# Patient Record
Sex: Male | Born: 1970 | Race: White | Hispanic: No | Marital: Married | Smoking: Never smoker
Health system: Southern US, Community
[De-identification: ages and names within clinical notes are randomized; demographics above are authoritative.]

## PROBLEM LIST (undated history)

## (undated) DIAGNOSIS — Z8719 Personal history of other diseases of the digestive system: Secondary | ICD-10-CM

## (undated) DIAGNOSIS — I341 Nonrheumatic mitral (valve) prolapse: Secondary | ICD-10-CM

## (undated) DIAGNOSIS — R55 Syncope and collapse: Secondary | ICD-10-CM

## (undated) DIAGNOSIS — K449 Diaphragmatic hernia without obstruction or gangrene: Secondary | ICD-10-CM

## (undated) DIAGNOSIS — R4702 Dysphasia: Secondary | ICD-10-CM

## (undated) DIAGNOSIS — K219 Gastro-esophageal reflux disease without esophagitis: Secondary | ICD-10-CM

## (undated) DIAGNOSIS — K629 Disease of anus and rectum, unspecified: Secondary | ICD-10-CM

## (undated) DIAGNOSIS — K625 Hemorrhage of anus and rectum: Secondary | ICD-10-CM

## (undated) DIAGNOSIS — R51 Headache: Secondary | ICD-10-CM

## (undated) DIAGNOSIS — S83289A Other tear of lateral meniscus, current injury, unspecified knee, initial encounter: Secondary | ICD-10-CM

## (undated) DIAGNOSIS — Z8711 Personal history of peptic ulcer disease: Secondary | ICD-10-CM

## (undated) DIAGNOSIS — M1990 Unspecified osteoarthritis, unspecified site: Secondary | ICD-10-CM

## (undated) DIAGNOSIS — K649 Unspecified hemorrhoids: Secondary | ICD-10-CM

## (undated) DIAGNOSIS — K226 Gastro-esophageal laceration-hemorrhage syndrome: Secondary | ICD-10-CM

## (undated) DIAGNOSIS — G43109 Migraine with aura, not intractable, without status migrainosus: Secondary | ICD-10-CM

## (undated) DIAGNOSIS — Z9289 Personal history of other medical treatment: Secondary | ICD-10-CM

## (undated) HISTORY — DX: Migraine with aura, not intractable, without status migrainosus: G43.109

## (undated) HISTORY — DX: Nonrheumatic mitral (valve) prolapse: I34.1

## (undated) HISTORY — DX: Headache: R51

## (undated) HISTORY — PX: MULTIPLE TOOTH EXTRACTIONS: SHX2053

## (undated) HISTORY — DX: Unspecified osteoarthritis, unspecified site: M19.90

## (undated) HISTORY — DX: Syncope and collapse: R55

## (undated) HISTORY — DX: Personal history of other medical treatment: Z92.89

## (undated) HISTORY — DX: Other tear of lateral meniscus, current injury, unspecified knee, initial encounter: S83.289A

## (undated) HISTORY — DX: Gastro-esophageal laceration-hemorrhage syndrome: K22.6

## (undated) HISTORY — PX: KNEE ARTHROSCOPY: SUR90

## (undated) HISTORY — PX: OTHER SURGICAL HISTORY: SHX169

## (undated) SURGERY — ESOPHAGOGASTRODUODENOSCOPY (EGD) WITH PROPOFOL
Anesthesia: Monitor Anesthesia Care

---
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
## 1999-02-11 ENCOUNTER — Emergency Department (HOSPITAL_COMMUNITY): Admission: EM | Admit: 1999-02-11 | Discharge: 1999-02-11 | Payer: Self-pay | Admitting: Emergency Medicine

## 1999-05-14 ENCOUNTER — Encounter: Payer: Self-pay | Admitting: Emergency Medicine

## 1999-05-14 ENCOUNTER — Emergency Department (HOSPITAL_COMMUNITY): Admission: EM | Admit: 1999-05-14 | Discharge: 1999-05-14 | Payer: Self-pay | Admitting: Emergency Medicine

## 1999-07-10 ENCOUNTER — Emergency Department (HOSPITAL_COMMUNITY): Admission: EM | Admit: 1999-07-10 | Discharge: 1999-07-10 | Payer: Self-pay | Admitting: Emergency Medicine

## 1999-07-10 ENCOUNTER — Encounter: Payer: Self-pay | Admitting: Emergency Medicine

## 1999-07-27 ENCOUNTER — Emergency Department (HOSPITAL_COMMUNITY): Admission: EM | Admit: 1999-07-27 | Discharge: 1999-07-27 | Payer: Self-pay | Admitting: *Deleted

## 1999-07-29 ENCOUNTER — Emergency Department (HOSPITAL_COMMUNITY): Admission: EM | Admit: 1999-07-29 | Discharge: 1999-07-29 | Payer: Self-pay | Admitting: Emergency Medicine

## 1999-07-29 ENCOUNTER — Encounter: Payer: Self-pay | Admitting: Emergency Medicine

## 1999-08-16 ENCOUNTER — Emergency Department (HOSPITAL_COMMUNITY): Admission: EM | Admit: 1999-08-16 | Discharge: 1999-08-16 | Payer: Self-pay

## 2000-04-03 ENCOUNTER — Emergency Department (HOSPITAL_COMMUNITY): Admission: EM | Admit: 2000-04-03 | Discharge: 2000-04-03 | Payer: Self-pay | Admitting: Emergency Medicine

## 2000-07-13 ENCOUNTER — Emergency Department (HOSPITAL_COMMUNITY): Admission: EM | Admit: 2000-07-13 | Discharge: 2000-07-13 | Payer: Self-pay | Admitting: Emergency Medicine

## 2000-07-13 ENCOUNTER — Encounter: Payer: Self-pay | Admitting: Emergency Medicine

## 2000-09-19 ENCOUNTER — Emergency Department (HOSPITAL_COMMUNITY): Admission: EM | Admit: 2000-09-19 | Discharge: 2000-09-19 | Payer: Self-pay | Admitting: Emergency Medicine

## 2000-09-27 ENCOUNTER — Encounter: Admission: RE | Admit: 2000-09-27 | Discharge: 2000-09-27 | Payer: Self-pay | Admitting: Urology

## 2000-09-27 ENCOUNTER — Encounter: Payer: Self-pay | Admitting: Urology

## 2001-09-04 ENCOUNTER — Emergency Department (HOSPITAL_COMMUNITY): Admission: EM | Admit: 2001-09-04 | Discharge: 2001-09-04 | Payer: Self-pay | Admitting: Emergency Medicine

## 2002-08-07 ENCOUNTER — Emergency Department (HOSPITAL_COMMUNITY): Admission: EM | Admit: 2002-08-07 | Discharge: 2002-08-07 | Payer: Self-pay | Admitting: Emergency Medicine

## 2004-10-22 ENCOUNTER — Emergency Department (HOSPITAL_COMMUNITY): Admission: EM | Admit: 2004-10-22 | Discharge: 2004-10-22 | Payer: Self-pay | Admitting: Emergency Medicine

## 2005-01-05 ENCOUNTER — Emergency Department (HOSPITAL_COMMUNITY): Admission: EM | Admit: 2005-01-05 | Discharge: 2005-01-05 | Payer: Self-pay | Admitting: Family Medicine

## 2005-05-02 ENCOUNTER — Emergency Department (HOSPITAL_COMMUNITY): Admission: EM | Admit: 2005-05-02 | Discharge: 2005-05-03 | Payer: Self-pay | Admitting: Emergency Medicine

## 2006-08-16 ENCOUNTER — Emergency Department (HOSPITAL_COMMUNITY): Admission: EM | Admit: 2006-08-16 | Discharge: 2006-08-16 | Payer: Self-pay | Admitting: Family Medicine

## 2006-08-25 ENCOUNTER — Emergency Department (HOSPITAL_COMMUNITY): Admission: EM | Admit: 2006-08-25 | Discharge: 2006-08-25 | Payer: Self-pay | Admitting: Family Medicine

## 2007-10-16 ENCOUNTER — Emergency Department (HOSPITAL_COMMUNITY): Admission: EM | Admit: 2007-10-16 | Discharge: 2007-10-16 | Payer: Self-pay | Admitting: Emergency Medicine

## 2008-03-08 ENCOUNTER — Emergency Department (HOSPITAL_COMMUNITY): Admission: EM | Admit: 2008-03-08 | Discharge: 2008-03-08 | Payer: Self-pay | Admitting: Emergency Medicine

## 2009-01-29 ENCOUNTER — Encounter: Payer: Self-pay | Admitting: Family Medicine

## 2009-02-09 ENCOUNTER — Encounter: Payer: Self-pay | Admitting: Family Medicine

## 2009-02-26 ENCOUNTER — Encounter: Payer: Self-pay | Admitting: Family Medicine

## 2009-02-26 HISTORY — PX: KNEE ARTHROSCOPY: SUR90

## 2009-03-04 ENCOUNTER — Encounter: Payer: Self-pay | Admitting: Family Medicine

## 2009-04-15 ENCOUNTER — Encounter: Payer: Self-pay | Admitting: Family Medicine

## 2010-01-22 ENCOUNTER — Encounter: Payer: Self-pay | Admitting: Family Medicine

## 2010-02-06 ENCOUNTER — Encounter: Payer: Self-pay | Admitting: Family Medicine

## 2010-02-09 ENCOUNTER — Encounter: Payer: Self-pay | Admitting: Family Medicine

## 2010-03-01 ENCOUNTER — Emergency Department (HOSPITAL_COMMUNITY)
Admission: EM | Admit: 2010-03-01 | Discharge: 2010-03-01 | Payer: Self-pay | Source: Home / Self Care | Admitting: Emergency Medicine

## 2010-03-10 ENCOUNTER — Telehealth: Payer: Self-pay | Admitting: Family Medicine

## 2010-03-10 ENCOUNTER — Ambulatory Visit: Payer: Self-pay | Admitting: Family Medicine

## 2010-03-10 DIAGNOSIS — R5383 Other fatigue: Secondary | ICD-10-CM

## 2010-03-10 DIAGNOSIS — R519 Headache, unspecified: Secondary | ICD-10-CM | POA: Insufficient documentation

## 2010-03-10 DIAGNOSIS — R5381 Other malaise: Secondary | ICD-10-CM | POA: Insufficient documentation

## 2010-03-10 DIAGNOSIS — R51 Headache: Secondary | ICD-10-CM | POA: Insufficient documentation

## 2010-03-10 DIAGNOSIS — R300 Dysuria: Secondary | ICD-10-CM | POA: Insufficient documentation

## 2010-03-10 LAB — CONVERTED CEMR LAB
Blood in Urine, dipstick: NEGATIVE
Protein, U semiquant: NEGATIVE
Urobilinogen, UA: 0.2
WBC Urine, dipstick: NEGATIVE

## 2010-03-11 ENCOUNTER — Encounter: Payer: Self-pay | Admitting: Family Medicine

## 2010-03-14 HISTORY — PX: SHOULDER SURGERY: SHX246

## 2010-03-14 HISTORY — PX: SHOULDER ARTHROSCOPY W/ ROTATOR CUFF REPAIR: SHX2400

## 2010-03-18 ENCOUNTER — Encounter: Payer: Self-pay | Admitting: Family Medicine

## 2010-03-22 ENCOUNTER — Encounter: Payer: Self-pay | Admitting: Family Medicine

## 2010-04-06 ENCOUNTER — Ambulatory Visit
Admission: RE | Admit: 2010-04-06 | Discharge: 2010-04-06 | Payer: Self-pay | Source: Home / Self Care | Attending: Family Medicine | Admitting: Family Medicine

## 2010-04-07 ENCOUNTER — Encounter
Admission: RE | Admit: 2010-04-07 | Discharge: 2010-04-07 | Payer: Self-pay | Source: Home / Self Care | Attending: Neurosurgery | Admitting: Neurosurgery

## 2010-04-09 ENCOUNTER — Ambulatory Visit
Admission: RE | Admit: 2010-04-09 | Discharge: 2010-04-09 | Payer: Self-pay | Source: Home / Self Care | Attending: Family Medicine | Admitting: Family Medicine

## 2010-04-15 ENCOUNTER — Encounter: Payer: Self-pay | Admitting: Family Medicine

## 2010-04-15 NOTE — Consult Note (Signed)
Summary: [facility]  [facility]   Imported By: Lanelle Bal 04/01/2010 08:47:11  _____________________________________________________________________  External Attachment:    Type:   Image     Comment:   External Document

## 2010-04-15 NOTE — Assessment & Plan Note (Signed)
Summary: tb skin test for school/alc  Nurse Visit   Allergies: 1)  ! Codeine 2)  ! Vicodin (Hydrocodone-Acetaminophen)  Immunizations Administered:  PPD Skin Test:    Vaccine Type: PPD    Site: left forearm    Mfr: Sanofi Pasteur    Dose: 0.1 ml    Route: ID    Given by: Selena Batten Dance CMA (AAMA)    Exp. Date: 11/13/2011    Lot #: [id number]  Orders Added: 1)  TB Skin Test [86580] 2)  Admin 1st Vaccine [90471]  Appended Document: tb skin test for school/alc    Clinical Lists Changes  Observations: Added new observation of TB PPDRESULT: negative (04/09/2010 8:45) Added new observation of PPD RESULT: < 5mm (04/09/2010 8:45) Added new observation of TB-PPD RDDTE: 04/09/2010 (04/09/2010 8:45)       PPD Results    Date of reading: 04/09/2010    Results: < 5mm    Interpretation: negative

## 2010-04-15 NOTE — Assessment & Plan Note (Signed)
Summary: Read ppd//kad  Nurse Visit  Comments Patient came in for PPD reading. Negative reading. Added to chart. Kim Dance CMA Oveta Idris Dull)  April 09, 2010 8:48 AM    Allergies: 1)  ! Codeine 2)  ! Vicodin (Hydrocodone-Acetaminophen)

## 2010-04-15 NOTE — Letter (Signed)
Summary: Kathryne Hitch MD/[facility]  Kathryne Hitch MD/[facility]   Imported By: Lester Adeline 04/05/2010 10:42:09  _____________________________________________________________________  External Attachment:    Type:   Image     Comment:   External Document

## 2010-04-15 NOTE — Op Note (Signed)
Summary: [facility] K[facility]/Christopher Aretha Parrot MD  [facility]/Christopher Aretha Parrot MD   Imported By: Lester [place] 04/05/2010 10:31:46  _____________________________________________________________________  External Attachment:    Type:   Image     Comment:   External Document  Appended Document: [facility]/Christopher Aretha Parrot MD    Clinical Lists Changes  Observations: Added new observation of PAST SURG HX: 1987  Right elbow surgery 1990s  Appendix 2010  Right knee arthroscopy (04/05/2010 17:30)       Past History:  Past Surgical History: 1987  Right elbow surgery 1990s  Appendix 2010  Right knee arthroscopy

## 2010-04-15 NOTE — Assessment & Plan Note (Signed)
Summary: new patient/rbh ok per lugene gave npp   Vital Signs:  Patient profile:   40 year old male Height:      67 inches Weight:      160.25 pounds BMI:     25.19 Temp:     97.9 degrees F oral Pulse rate:   84 / minute Pulse rhythm:   regular BP sitting:   120 / 80  (left arm) Cuff size:   regular  Vitals Entered By: Delilah Shan CMA Duncan Dull) (March 10, 2010 2:05 PM) CC: New Patient to Establish   History of Present Illness: Prev seen at ER and recs reviewed.  Last week was the first time the patient had HA with vision change.  CT neg and other labs unremarkable.  HA is not present now.  BP normal today.  "Whole head was hurting."  Was prev taking BC for HAs, usually around 2x/week, usually with relief.  Normally HA a/w chest pain and both would respond to BP powder.  This past episode was the only time the patient had vision change with it.  drinking up to 6 cokes a day.  Took percocet for HA with transient relief, but then the HA would occ come back.  No vision changes now, back to baseline per patient.  Prev with bilateral blurry changes.  His arms were as weak as normal during the episode.  This didn't change per patient.   Change in grip noted over last year.  L handed.  R hand is weaker than the L per patient.  This isn't a new finding per patient.   Some burning with urination.  "Going on for awhile."  Burning every time with urination, with initiatial stream.   Preventive Screening-Counseling & Management  Alcohol-Tobacco     Smoking Status: never  Caffeine-Diet-Exercise     Does Patient Exercise: yes      Drug Use:  no.    Allergies (verified): 1)  ! Codeine 2)  ! 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 Married, 2004 Never Smoked Alcohol use-no Drug use-no Regular exercise-yes  Past Medical History: headaches mitral valve prolapse  Past Surgical History: 1987  Right elbow surgery 1990s  Appendix 2010  Right knee surgery  Family History: Family History Hypertension, parents Family History of Stroke F 1st degree relative <60, parents F dead, h/o CHF, asthma, was found dead M alive with h/o CVA, migraines  Social History: Occupation:  IT sales professional, 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  Otherwise negative.    Physical Exam  General:  GEN: nad, alert and oriented HEENT: mucous membranes moist NECK: supple w/o LA CV: rrr.  no murmur PULM: ctab, no inc wob ABD: soft, +bs EXT: no edema SKIN: no acute rash  CN 2-12 wnl B, S/S/DTR wnl x4 except for weakness in grip bilaterally fundus wnl   Impression & Recommendations:  Problem # 1:  HEADACHE (ICD-784.0) Likley migraine and I advised patient to wean off caffeine, soda, tea, BC powders and other pain meds. If continuing, will  need to consider abortive and proph tx. He understands.    Problem # 2:  WEAKNESS (ICD-780.79) He has bilateral grip weakness.  This may be due to carpal tunnel but he also has cervical ribs.  I called and d/w neurosurg and they well see patient and set up imaging and nerve conduction studies as needed.  I will defer to them.   Problem # 3:  DYSURIA (ICD-788.1) Check cx and contact with results.  He agrees.  this may  be related to soda intake and may resolve on its own.  Orders: Specimen Handling (16109) T-Culture, Urine ([id number])  Other Orders: UA Dipstick w/o Micro (manual) (91478)  Patient Instructions: 1)  Get your shot records and  lab results from work.  Give me a copy and I'll look at them. 2)  Gradually cut back on the cokes and tea.  That should help with the headaches.  Don't quit cold [place]. 3)  I'll let you know about the follow up for your hands.  Take care.    Orders Added: 1)  UA Dipstick w/o Micro (manual) [81002] 2)  Specimen Handling [99000] 3)  T-Culture, Urine [[id number]] 4)  New Patient Level III [57846]    Prior Medications (reviewed today): None Current Allergies (reviewed today): ! CODEINE ! VICODIN (HYDROCODONE-ACETAMINOPHEN)   Laboratory Results   Urine Tests  Date/Time Received: March 10, 2010 2:49 PM   Routine Urinalysis   Color: yellow Appearance: Clear Glucose: negative   (Normal Range: Negative) Bilirubin: negative   (Normal Range: Negative) Ketone: negative   (Normal Range: Negative) Spec. Gravity: 1.020   (Normal Range: 1.003-1.035) Blood: negative   (Normal Range: Negative) pH: 6.0   (Normal Range: 5.0-8.0) Protein: negative   (Normal Range: Negative) Urobilinogen: 0.2   (Normal Range: 0-1) Nitrite: negative   (Normal Range: Negative) Leukocyte Esterace: negative   (Normal Range: Negative)

## 2010-04-15 NOTE — Letter (Signed)
Summary: Kathryne Hitch MD/[facility]  Kathryne Hitch MD/[facility]   Imported By: Lester [place] 04/05/2010 10:25:59  _____________________________________________________________________  External Attachment:    Type:   Image     Comment:   External Document

## 2010-04-15 NOTE — Letter (Signed)
Summary: Pre-OP note/[facility]  Pre-OP note/[facility]   Imported By: Lester [place] 04/05/2010 10:30:06  _____________________________________________________________________  External Attachment:    Type:   Image     Comment:   External Document

## 2010-04-15 NOTE — Letter (Signed)
Summary: Kathryne Hitch MD/[facility]  Kathryne Hitch MD/[facility]   Imported By: Lester [place] 04/05/2010 10:38:09  _____________________________________________________________________  External Attachment:    Type:   Image     Comment:   External Document

## 2010-04-15 NOTE — Progress Notes (Signed)
  Phone Note Outgoing Call   Summary of Call: please call patient.  He should expect a call from neurosurg clinic in [place] in next 1-2 days will info re: appointment.  thanks.  Initial call taken by: Crawford Givens MD,  March 10, 2010 3:16 PM  Follow-up for Phone Call        Patient Advised.  Follow-up by: Delilah Shan CMA Duncan Dull),  March 10, 2010 4:54 PM

## 2010-04-15 NOTE — Letter (Signed)
Summary: [facility]   Imported By: Maryln Gottron 04/05/2010 10:42:50  _____________________________________________________________________  External Attachment:    Type:   Image     Comment:   External Document

## 2010-04-15 NOTE — Letter (Signed)
Summary: [facility]   Imported By: Maryln Gottron 04/05/2010 10:41:32  _____________________________________________________________________  External Attachment:    Type:   Image     Comment:   External Document

## 2010-04-15 NOTE — Letter (Signed)
Summary: Kathryne Hitch MD/[facility]  Kathryne Hitch MD/[facility]   Imported By: Lester [place] 04/05/2010 10:40:31  _____________________________________________________________________  External Attachment:    Type:   Image     Comment:   External Document

## 2010-05-05 NOTE — Letter (Signed)
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

## 2010-05-24 LAB — DIFFERENTIAL
Eosinophils Absolute: 0.5 10*3/uL (ref 0.0–0.7)
Eosinophils Relative: 6 % — ABNORMAL HIGH (ref 0–5)

## 2010-05-24 LAB — CBC
HCT: 47.8 % (ref 39.0–52.0)
MCH: 30.4 pg (ref 26.0–34.0)
MCHC: 35.6 g/dL (ref 30.0–36.0)
MCV: 85.4 fL (ref 78.0–100.0)
RBC: 5.6 MIL/uL (ref 4.22–5.81)
RDW: 13 % (ref 11.5–15.5)

## 2010-05-24 LAB — HEPATIC FUNCTION PANEL
ALT: 33 U/L (ref 0–53)
AST: 28 U/L (ref 0–37)
Albumin: 3.4 g/dL — ABNORMAL LOW (ref 3.5–5.2)
Bilirubin, Direct: 0.1 mg/dL (ref 0.0–0.3)
Total Bilirubin: 0.4 mg/dL (ref 0.3–1.2)

## 2010-05-24 LAB — POCT CARDIAC MARKERS
CKMB, poc: <1 ng/mL — ABNORMAL LOW (ref 1.0–8.0)
Troponin i, poc: <0.05 ng/mL (ref 0.00–0.09)

## 2010-05-24 LAB — BASIC METABOLIC PANEL
BUN: 7 mg/dL (ref 6–23)
Calcium: 9.2 mg/dL (ref 8.4–10.5)
Creatinine, Ser: 1.02 mg/dL (ref 0.4–1.5)
GFR calc non Af Amer: >60 mL/min (ref >60)
Glucose, Bld: 86 mg/dL (ref 70–99)
Sodium: 139 mEq/L (ref 135–145)

## 2010-06-08 ENCOUNTER — Other Ambulatory Visit: Payer: Self-pay | Admitting: Family Medicine

## 2010-06-08 ENCOUNTER — Encounter: Payer: Self-pay | Admitting: Family Medicine

## 2010-06-08 DIAGNOSIS — Z0289 Encounter for other administrative examinations: Secondary | ICD-10-CM

## 2010-06-09 ENCOUNTER — Ambulatory Visit: Payer: Self-pay | Admitting: *Deleted

## 2010-06-09 ENCOUNTER — Other Ambulatory Visit (INDEPENDENT_AMBULATORY_CARE_PROVIDER_SITE_OTHER): Payer: BC Managed Care – PPO | Admitting: Family Medicine

## 2010-06-09 DIAGNOSIS — Z0289 Encounter for other administrative examinations: Secondary | ICD-10-CM

## 2010-06-09 DIAGNOSIS — Z23 Encounter for immunization: Secondary | ICD-10-CM

## 2010-06-09 NOTE — Progress Notes (Signed)
Addended by: Josph Macho on: 06/09/2010 11:08 AM   Modules accepted: Orders

## 2010-06-10 ENCOUNTER — Encounter: Payer: Self-pay | Admitting: *Deleted

## 2010-08-06 ENCOUNTER — Other Ambulatory Visit: Payer: Self-pay | Admitting: Orthopaedic Surgery

## 2010-08-06 DIAGNOSIS — M25511 Pain in right shoulder: Secondary | ICD-10-CM

## 2010-08-10 ENCOUNTER — Ambulatory Visit
Admission: RE | Admit: 2010-08-10 | Discharge: 2010-08-10 | Disposition: A | Discharge status: Home / Self Care | Payer: BC Managed Care – PPO | Source: Ambulatory Visit | Attending: Orthopaedic Surgery | Admitting: Orthopaedic Surgery

## 2010-08-10 DIAGNOSIS — M25511 Pain in right shoulder: Secondary | ICD-10-CM

## 2011-03-03 ENCOUNTER — Encounter: Payer: Self-pay | Admitting: Family Medicine

## 2011-03-03 ENCOUNTER — Ambulatory Visit (INDEPENDENT_AMBULATORY_CARE_PROVIDER_SITE_OTHER): Payer: BC Managed Care – PPO | Admitting: Family Medicine

## 2011-03-03 DIAGNOSIS — J111 Influenza due to unidentified influenza virus with other respiratory manifestations: Secondary | ICD-10-CM

## 2011-03-03 MED ORDER — OSELTAMIVIR PHOSPHATE 75 MG PO CAPS: 75.0000 mg | ORAL_CAPSULE | Freq: Two times a day (BID) | ORAL | Status: AC

## 2011-03-03 NOTE — Progress Notes (Signed)
Taking otc tylenol/benadryl/phenylephrine cough syrup.    Sx stared <48 hours ago, Tuesday night.  Had to come home from work with fever, chills, HA and cough.  Temp elevated prev at home.  No sputum.  Diffuse aches.  No ear pain, did have diarrhea and nausea.  Hadn't had flu shot.    Meds, vitals, and allergies reviewed.   ROS: See HPI.  Otherwise, noncontributory.  nad but he looks like he doesn't feel well, but nontoxic. alert HEENT: mucous membranes moist, tm w/o erythema, nasal exam w/o erythema, clear discharge noted,  OP with cobblestoning NECK: supple w/o LA CV: rrr.   PULM: ctab, no inc wob EXT: no edema SKIN: no acute rash Large muscle groups ttp

## 2011-03-03 NOTE — Patient Instructions (Signed)
I would get a flu shot each fall.   Start the tamiflu today.  Drink plenty of fluids, take tylenol as needed, and gargle with warm salt water for your throat.  This should gradually improve over the next week.  Take care.  Let us know if you have other concerns.  If you get short of breath, then go to the ER.

## 2011-03-04 DIAGNOSIS — J111 Influenza due to unidentified influenza virus with other respiratory manifestations: Secondary | ICD-10-CM | POA: Insufficient documentation

## 2011-03-04 NOTE — Assessment & Plan Note (Signed)
Presumed, nontoxic, okay for outpatient fu.  D/w pt about flu vaccine, start tamiflu and f/u prn.  If sob to ER

## 2011-07-19 ENCOUNTER — Encounter: Payer: Self-pay | Admitting: Family Medicine

## 2011-07-19 ENCOUNTER — Encounter: Payer: Self-pay | Admitting: Gastroenterology

## 2011-07-19 ENCOUNTER — Ambulatory Visit (INDEPENDENT_AMBULATORY_CARE_PROVIDER_SITE_OTHER): Payer: BC Managed Care – PPO | Admitting: Family Medicine

## 2011-07-19 VITALS — BP 122/78 | HR 65 | Temp 98.3°F | Wt 144.0 lb

## 2011-07-19 DIAGNOSIS — K921 Melena: Secondary | ICD-10-CM

## 2011-07-19 LAB — CBC WITH DIFFERENTIAL/PLATELET
Basophils Absolute: 0 10*3/uL (ref 0.0–0.1)
Basophils Relative: 0.3 % (ref 0.0–3.0)
Eosinophils Relative: 5 % (ref 0.0–5.0)
HCT: 50.6 % (ref 39.0–52.0)
Hemoglobin: 17.1 g/dL — ABNORMAL HIGH (ref 13.0–17.0)
MCHC: 33.8 g/dL (ref 30.0–36.0)
Monocytes Absolute: 0.7 10*3/uL (ref 0.1–1.0)
Monocytes Relative: 9.9 % (ref 3.0–12.0)
RDW: 13.5 % (ref 11.5–14.6)

## 2011-07-19 NOTE — Progress Notes (Signed)
Rectal bleeding.  Happened 2 weeks ago.  Some blood in stool, BRBPR.  Happened again this AM.  Bright red early AM today and also dark stools late AM today.  Fatigued.  Taking 200mg  ibuprofen at a time, total of ~400mg  a day.  Felt light headed this AM but not now.  No aspirin.  No other episodes of blood in stool before these episodes.  No personal or FH colon cancer, UC, crohn's disease.    No FCNAVD.  No rectal pain now, prev had some resolved abd pain ~1 AM.  Lasted about 15 minutes, around the time of the BM this AM.    Meds, vitals, and allergies reviewed.   ROS: See HPI.  Otherwise, noncontributory.  nad ncat Mmm, no lesions noted rrr ctab Abd soft, not ttp, normal BS Rectal exam with ext hemorrhoid w/o gross blood

## 2011-07-19 NOTE — Patient Instructions (Signed)
We'll contact you with your lab report. See [name] about your referral before you leave today. Take care.  Stop taking any aspirin, ibuprofen, aleve.  Take tylenol if needed. Take colace 100mg  a day- stool softener.  Don't strain with a BM.  Warm water soaks as needed.

## 2011-07-21 DIAGNOSIS — K921 Melena: Secondary | ICD-10-CM | POA: Insufficient documentation

## 2011-07-21 NOTE — Assessment & Plan Note (Signed)
With hemorrhoid but I am concerned about the black stools. Not anemic, but would rec f/u with GI about this.  He agrees. See notes on labs.

## 2011-08-02 ENCOUNTER — Emergency Department (HOSPITAL_COMMUNITY): Payer: BC Managed Care – PPO

## 2011-08-02 ENCOUNTER — Emergency Department (HOSPITAL_COMMUNITY)
Admission: EM | Admit: 2011-08-02 | Discharge: 2011-08-02 | Disposition: A | Discharge status: Home / Self Care | Payer: BC Managed Care – PPO | Attending: Emergency Medicine | Admitting: Emergency Medicine

## 2011-08-02 DIAGNOSIS — M542 Cervicalgia: Secondary | ICD-10-CM | POA: Insufficient documentation

## 2011-08-02 DIAGNOSIS — G43909 Migraine, unspecified, not intractable, without status migrainosus: Secondary | ICD-10-CM | POA: Insufficient documentation

## 2011-08-02 MED ORDER — DEXAMETHASONE SODIUM PHOSPHATE 10 MG/ML IJ SOLN
10.0000 mg | INTRAMUSCULAR | Status: AC
  Administered 2011-08-02: 10 mg via INTRAVENOUS
  Filled 2011-08-02: qty 1

## 2011-08-02 MED ORDER — DIPHENHYDRAMINE HCL 50 MG/ML IJ SOLN
INTRAMUSCULAR | Status: AC
  Filled 2011-08-02: qty 1

## 2011-08-02 MED ORDER — SODIUM CHLORIDE 0.9 % IV BOLUS (SEPSIS)
1000.0000 mL | INTRAVENOUS | Status: AC
  Administered 2011-08-02: 1000 mL via INTRAVENOUS

## 2011-08-02 MED ORDER — DIPHENHYDRAMINE HCL 50 MG/ML IJ SOLN
12.5000 mg | Freq: Once | INTRAMUSCULAR | Status: AC
  Administered 2011-08-02: 12.5 mg via INTRAVENOUS

## 2011-08-02 MED ORDER — DIPHENHYDRAMINE HCL 50 MG/ML IJ SOLN
12.5000 mg | INTRAMUSCULAR | Status: AC
  Administered 2011-08-02: 12.5 mg via INTRAVENOUS
  Filled 2011-08-02: qty 1

## 2011-08-02 MED ORDER — PROCHLORPERAZINE EDISYLATE 5 MG/ML IJ SOLN
10.0000 mg | INTRAMUSCULAR | Status: AC
  Administered 2011-08-02: 10 mg via INTRAVENOUS
  Filled 2011-08-02: qty 2

## 2011-08-02 NOTE — ED Provider Notes (Signed)
History     CSN: [id number]  Arrival date & time 08/02/11  2135   None     Chief Complaint  Patient presents with  . Headache    (Consider location/radiation/quality/duration/timing/severity/associated sxs/prior treatment) Patient is a 41 y.o. male presenting with headaches. The history is provided by the patient.  Headache  This is a new problem. Episode onset: 4 days ago. The problem occurs constantly. The problem has been gradually worsening. The headache is associated with nothing. The pain is located in the right unilateral region. The quality of the pain is described as throbbing. The pain is at a severity of 9/10. The pain is moderate. The pain radiates to the right neck. Associated symptoms include nausea. Pertinent negatives include no fever, no shortness of breath and no vomiting. Associated symptoms comments: Blurry vision. He has tried acetaminophen and NSAIDs for the symptoms. The treatment provided mild relief.    Past Medical History  Diagnosis Date  . Headache   . MVP (mitral valve prolapse)   . Lateral meniscus tear     Right    Past Surgical History  Procedure Date  . Elbow surgery 1987    Right  . Appendectomy 1990's  . Knee arthroscopy     Right  . Shoulder surgery 2012    right rotator cuff    Family History  Problem Relation Age of Onset  . Hypertension Mother   . Stroke Mother   . Migraines Mother   . Hypertension Father   . Stroke Father   . Heart failure Father   . Asthma Father   . Colon cancer Neg Hx   . Prostate cancer Neg Hx     History  Substance Use Topics  . Smoking status: Never Smoker   . Smokeless tobacco: Never Used  . Alcohol Use: No      Review of Systems  Constitutional: Negative for fever.  HENT: Negative for rhinorrhea, drooling and neck pain.   Eyes: Negative for pain.  Respiratory: Negative for cough and shortness of breath.   Cardiovascular: Negative for chest pain and leg swelling.  Gastrointestinal:  Positive for nausea. Negative for vomiting, abdominal pain and diarrhea.  Genitourinary: Negative for dysuria and hematuria.  Musculoskeletal: Negative for gait problem.  Skin: Negative for color change.  Neurological: Positive for headaches. Negative for numbness.  Hematological: Negative for adenopathy.  Psychiatric/Behavioral: Negative for behavioral problems.  All other systems reviewed and are negative.    Allergies  Codeine and Hydrocodone-acetaminophen  Home Medications  No current outpatient prescriptions on file.  BP 129/78  Pulse 55  Temp(Src) 98.3 F (36.8 C) (Oral)  Resp 10  SpO2 100%  Physical Exam  Constitutional: He is oriented to person, place, and time. He appears well-developed and well-nourished.  HENT:  Head: Normocephalic and atraumatic.  Right Ear: External ear normal.  Left Ear: External ear normal.  Nose: Nose normal.  Mouth/Throat: Oropharynx is clear and moist. No oropharyngeal exudate.  Eyes: Conjunctivae and EOM are normal. Pupils are equal, round, and reactive to light.  Neck: Normal range of motion. Neck supple.       No nuchal rigidity, but mild pain w/ rom of neck.  Cardiovascular: Normal rate, regular rhythm, normal heart sounds and intact distal pulses.  Exam reveals no gallop and no friction rub.   No murmur heard. Pulmonary/Chest: Effort normal and breath sounds normal. No respiratory distress. He has no wheezes.  Abdominal: Soft. Bowel sounds are normal. He exhibits no distension. There  is no tenderness.  Musculoskeletal: Normal range of motion. He exhibits no edema and no tenderness.  Neurological: He is alert and oriented to person, place, and time. He has normal strength. No cranial nerve deficit or sensory deficit. Coordination normal.  Skin: Skin is warm and dry.  Psychiatric: He has a normal mood and affect. His behavior is normal.    ED Course  Procedures (including critical care time)  Labs Reviewed - No data to display No  results found.   No diagnosis found.    MDM  10:10 PM 41 y.o. male pw right sided HA x 4 days. Pt denies trauma, fever. Pt AFVSS here, appears well on exam. Neurologically intact. Pt does not have hx of migraines. Will get mig cocktail, CT head.   Pt refuses CT head. He had mild dystonic reaction to the compazine and would like to go home. He states that he is feeling better.  11:24 PM:  I have discussed the diagnosis/risks/treatment options with the patient and family and believe the pt to be eligible for discharge home to follow-up with pcp tomorrow if HA persists. We also discussed returning to the ED immediately if new or worsening sx occur. We discussed the sx which are most concerning (e.g., worsening HA, fever) that necessitate immediate return. Any new prescriptions provided to the patient are listed below.  New Prescriptions   No medications on file     Clinical Impression 1. Migraine           Purvis Sheffield, MD 08/02/11 2344

## 2011-08-02 NOTE — ED Notes (Signed)
Complains of throbbing and pounding right sided headache x 4 days. Pain radiates down posterior neck to right shoulder. Rates pain 10/10. Putting chin to chest aggravates the pain. Also has blurred vision and nausea. No vomiting. Hx migraine headaches. EKG unremarkable. Stroke screen negative per EMS. Neuro intact. No deficits. VSS. AAOx4.

## 2011-08-02 NOTE — Discharge Instructions (Signed)

## 2011-08-04 NOTE — ED Provider Notes (Signed)
I saw and evaluated the patient, reviewed the resident's note and I agree with the findings and plan.     Nelia Shi, MD 08/04/11 [phone]

## 2011-08-12 ENCOUNTER — Encounter: Payer: Self-pay | Admitting: Gastroenterology

## 2011-08-12 ENCOUNTER — Ambulatory Visit (INDEPENDENT_AMBULATORY_CARE_PROVIDER_SITE_OTHER)
Admission: RE | Admit: 2011-08-12 | Discharge: 2011-08-12 | Disposition: A | Discharge status: Home / Self Care | Payer: BC Managed Care – PPO | Source: Ambulatory Visit | Attending: Gastroenterology | Admitting: Gastroenterology

## 2011-08-12 ENCOUNTER — Ambulatory Visit (INDEPENDENT_AMBULATORY_CARE_PROVIDER_SITE_OTHER): Payer: BC Managed Care – PPO | Admitting: Gastroenterology

## 2011-08-12 ENCOUNTER — Telehealth: Payer: Self-pay | Admitting: Family Medicine

## 2011-08-12 ENCOUNTER — Other Ambulatory Visit (INDEPENDENT_AMBULATORY_CARE_PROVIDER_SITE_OTHER): Payer: BC Managed Care – PPO

## 2011-08-12 VITALS — BP 108/70 | HR 64 | Ht 67.0 in | Wt 142.0 lb

## 2011-08-12 DIAGNOSIS — K6289 Other specified diseases of anus and rectum: Secondary | ICD-10-CM

## 2011-08-12 DIAGNOSIS — R51 Headache: Secondary | ICD-10-CM

## 2011-08-12 DIAGNOSIS — R519 Headache, unspecified: Secondary | ICD-10-CM

## 2011-08-12 DIAGNOSIS — K625 Hemorrhage of anus and rectum: Secondary | ICD-10-CM

## 2011-08-12 LAB — CBC WITH DIFFERENTIAL/PLATELET
Basophils Absolute: 0 10*3/uL (ref 0.0–0.1)
Eosinophils Absolute: 0.5 10*3/uL (ref 0.0–0.7)
Hemoglobin: 16.9 g/dL (ref 13.0–17.0)
Lymphocytes Relative: 12.4 % (ref 12.0–46.0)
MCHC: 33.8 g/dL (ref 30.0–36.0)
Neutro Abs: 6 10*3/uL (ref 1.4–7.7)
Neutrophils Relative %: 72.1 % (ref 43.0–77.0)
RDW: 13.1 % (ref 11.5–14.6)

## 2011-08-12 LAB — COMPREHENSIVE METABOLIC PANEL
ALT: 14 U/L (ref 0–53)
AST: 15 U/L (ref 0–37)
Albumin: 4.1 g/dL (ref 3.5–5.2)
Calcium: 9.2 mg/dL (ref 8.4–10.5)
Chloride: 101 mEq/L (ref 96–112)
Potassium: 4.4 mEq/L (ref 3.5–5.1)
Sodium: 138 mEq/L (ref 135–145)

## 2011-08-12 MED ORDER — MOVIPREP 100 G PO SOLR: 1.0000 | ORAL | Status: DC

## 2011-08-12 MED ORDER — HYDROCORTISONE ACE-PRAMOXINE 1-1 % RE CREA: TOPICAL_CREAM | Freq: Three times a day (TID) | RECTAL | Status: AC

## 2011-08-12 NOTE — Progress Notes (Signed)
HPI: This is a   very pleasant 40-year-old man whom I am meeting for the first time today. He is here with his wife today.  For a bout 6 weeks, rectal bleeding.  5 out of 7 days.  Also pain at anus.  No constipation.  Has lost 10 pounds in 2 month.  He is eating well.  Pants fitting looser.  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 He has had severe headaches for about a month, throbbing in nature. He has been taking 2-3 Advil a day +3 deep hours a day for about a month. He is pretty certain that the headaches started after his anal discomfort and bleeding. He went to the emergency room for the headaches and a CT scan of his head was ordered however he had a anxiety reaction to Compazine and could not undergo the test..    Review of systems: Pertinent positive and negative review of systems were noted in the above HPI section. Complete review of systems was performed and was otherwise normal.    Past Medical History  Diagnosis Date  . Headache   . MVP (mitral valve prolapse)   . Lateral meniscus tear     Right    Past Surgical History  Procedure Date  . Elbow surgery 1987    Right  . Appendectomy 1990's  . Knee arthroscopy     Right  . Shoulder surgery 2012    right rotator cuff    Current Outpatient Prescriptions  Medication Sig Dispense Refill  . ibuprofen (ADVIL,MOTRIN) 200 MG tablet Take 200 mg by mouth every 6 (six) hours as needed. Take 3 tablets every six hours as needed for pain        Allergies as of 08/12/2011 - Review Complete 08/12/2011  Allergen Reaction Noted  . Codeine    . Hydrocodone-acetaminophen    . Compazine (prochlorperazine edisylate) Anxiety 08/02/2011    Family History  Problem Relation Age of Onset  . Hypertension Mother   . Stroke Mother   . Migraines Mother   . Hypertension Father   . Stroke Father   . Heart failure Father   . Asthma Father   . Colon cancer Neg Hx   . Prostate cancer Neg Hx   . Diabetes Mother   .  Colon polyps Father     History   Social History  . Marital Status: Married    Spouse Name: N/A    Number of Children: 1  . Years of Education: N/A   Occupational History  . Firefighter, since 2002   .     Social History Main Topics  . Smoking status: Never Smoker   . Smokeless tobacco: Never Used  . Alcohol Use: No  . Drug Use: No  . Sexually Active: Not on file   Other Topics Concern  . Not on file   Social History Narrative  . No narrative on file       Physical Exam: BP 108/70  Pulse 64  Ht 5' 7" (1.702 m)  Wt 142 lb (64.411 kg)  BMI 22.24 kg/m2 Constitutional: generally well-appearing Psychiatric: alert and oriented x3 Eyes: extraocular movements intact Mouth: oral pharynx moist, no lesions Neck: supple no lymphadenopathy Cardiovascular: heart regular rate and rhythm Lungs: clear to auscultation bilaterally Abdomen: soft, nontender, nondistended, no obvious ascites, no peritoneal signs, normal bowel sounds Extremities: no lower extremity edema bilaterally Skin: no lesions on visible extremities Rectal examination : Anus very tender, no   sign of abscess, they'll muscles very tight, appears almost strictured, there are at least one hemorrhoid externally, appears to have a small midline anterior fissure as well. I could not insert a finger due to significant pain.    Assessment and plan: 40 y.o. male with  anal pain, hemorrhoid, fissure, rectal bleeding, weight loss, severe headaches  not clear if his headaches are related to his GI process but they're concerning to me. They are very different for him. He was going to have a CT scan of his head in the emergency room but that did not happen do to a medicine reaction and so I re\re ordering it now. Suspect he might have Crohn's disease with anal rectal fissuring. What argues against that is that his bowels have been completely normal. Hopefully this is just benign, fissuring disease of the anus. He is going to  start sitz baths, topical ointments, fiber supplements. Going to proceed with a colonoscopy in about 2-3 weeks time to allow the anal symptoms some time to heal, clear up. He is also get some basic labs including CBC, complete metabolic profile and sedimentation rate. I will forward this to his primary care physician to alert them of his severe headaches, my plan to scan his head.     

## 2011-08-12 NOTE — Patient Instructions (Addendum)
Sitz baths twice to three times a day. Please start taking citrucel (orange flavored) powder fiber supplement.  This may cause some bloating at first but that usually goes away. Begin with a small spoonful and work your way up to a large, heaping spoonful daily over a week. Topical ointments called in, apply twice to three times a day after sitz baths. You will be set up for a colonoscopy with [facility] at [facility] or [facility] for rectal pain, bleeding (in 2-3 weeks).   You will be set up for a CT scan with an without IV contast for severe headaches.  Your appointment is today at 1045 am at [facility] on [address].  Please arrive at 1030 am.  There is no prep involved. You will have labs checked today in the basement lab.  Please head down after you check out with the front desk  (cbc, esr, cmet).

## 2011-08-12 NOTE — Telephone Encounter (Signed)
Left detailed message on VM of home/cell number.

## 2011-08-12 NOTE — Telephone Encounter (Signed)
Notify pt.  If the headaches continue, the follow up here so we can discuss.  I saw that GI was sending him for a head CT and it didn't show any problems.  They'll likely be in contact with him about it.

## 2011-08-13 DIAGNOSIS — K226 Gastro-esophageal laceration-hemorrhage syndrome: Secondary | ICD-10-CM

## 2011-08-13 HISTORY — DX: Gastro-esophageal laceration-hemorrhage syndrome: K22.6

## 2011-08-17 ENCOUNTER — Other Ambulatory Visit: Payer: Self-pay

## 2011-08-17 ENCOUNTER — Telehealth: Payer: Self-pay | Admitting: Gastroenterology

## 2011-08-17 ENCOUNTER — Observation Stay (HOSPITAL_COMMUNITY)
Admission: EM | Admit: 2011-08-17 | Discharge: 2011-08-18 | DRG: 175 | Disposition: A | Discharge status: Home / Self Care | Payer: BC Managed Care – PPO | Attending: Internal Medicine | Admitting: Internal Medicine

## 2011-08-17 ENCOUNTER — Emergency Department (HOSPITAL_COMMUNITY): Payer: BC Managed Care – PPO

## 2011-08-17 ENCOUNTER — Encounter (HOSPITAL_COMMUNITY): Payer: Self-pay | Admitting: *Deleted

## 2011-08-17 DIAGNOSIS — K6289 Other specified diseases of anus and rectum: Secondary | ICD-10-CM

## 2011-08-17 DIAGNOSIS — K2980 Duodenitis without bleeding: Secondary | ICD-10-CM | POA: Diagnosis present

## 2011-08-17 DIAGNOSIS — K625 Hemorrhage of anus and rectum: Secondary | ICD-10-CM

## 2011-08-17 DIAGNOSIS — K921 Melena: Secondary | ICD-10-CM

## 2011-08-17 DIAGNOSIS — Z791 Long term (current) use of non-steroidal anti-inflammatories (NSAID): Secondary | ICD-10-CM

## 2011-08-17 DIAGNOSIS — K920 Hematemesis: Secondary | ICD-10-CM | POA: Diagnosis present

## 2011-08-17 DIAGNOSIS — R51 Headache: Secondary | ICD-10-CM | POA: Diagnosis present

## 2011-08-17 DIAGNOSIS — K226 Gastro-esophageal laceration-hemorrhage syndrome: Principal | ICD-10-CM | POA: Diagnosis present

## 2011-08-17 DIAGNOSIS — R109 Unspecified abdominal pain: Secondary | ICD-10-CM

## 2011-08-17 DIAGNOSIS — R1033 Periumbilical pain: Secondary | ICD-10-CM | POA: Diagnosis present

## 2011-08-17 DIAGNOSIS — R519 Headache, unspecified: Secondary | ICD-10-CM | POA: Diagnosis present

## 2011-08-17 HISTORY — DX: Hemorrhage of anus and rectum: K62.5

## 2011-08-17 LAB — HEMOGLOBIN AND HEMATOCRIT, BLOOD
HCT: 45.5 % (ref 39.0–52.0)
HCT: 43.4 % (ref 39.0–52.0)
Hemoglobin: 16 g/dL (ref 13.0–17.0)
Hemoglobin: 15.3 g/dL (ref 13.0–17.0)

## 2011-08-17 LAB — DIFFERENTIAL
Basophils Absolute: 0 10*3/uL (ref 0.0–0.1)
Basophils Relative: 0 % (ref 0–1)
Eosinophils Absolute: 0.4 10*3/uL (ref 0.0–0.7)
Lymphs Abs: 1.3 10*3/uL (ref 0.7–4.0)
Neutrophils Relative %: 79 % — ABNORMAL HIGH (ref 43–77)

## 2011-08-17 LAB — BASIC METABOLIC PANEL
GFR calc Af Amer: >90 mL/min (ref >90)
GFR calc non Af Amer: >90 mL/min (ref >90)
Glucose, Bld: 95 mg/dL (ref 70–99)
Potassium: 3.7 mEq/L (ref 3.5–5.1)
Sodium: 135 mEq/L (ref 135–145)

## 2011-08-17 LAB — CBC
MCH: 30.5 pg (ref 26.0–34.0)
Platelets: 218 10*3/uL (ref 150–400)
RBC: 5.54 MIL/uL (ref 4.22–5.81)

## 2011-08-17 LAB — TYPE AND SCREEN
ABO/RH(D): A POS
Antibody Screen: NEGATIVE

## 2011-08-17 MED ORDER — HYDROCORTISONE ACE-PRAMOXINE 1-1 % RE CREA: TOPICAL_CREAM | Freq: Three times a day (TID) | RECTAL | Status: DC

## 2011-08-17 MED ORDER — ONDANSETRON HCL 4 MG/2ML IJ SOLN
4.0000 mg | INTRAMUSCULAR | Status: DC | PRN
  Administered 2011-08-17: 4 mg via INTRAVENOUS
  Filled 2011-08-17: qty 2

## 2011-08-17 MED ORDER — SODIUM CHLORIDE 0.9 % IV SOLN
8.0000 mg/h | INTRAVENOUS | Status: DC
  Administered 2011-08-17: 8 mg/h via INTRAVENOUS
  Filled 2011-08-17: qty 80

## 2011-08-17 MED ORDER — MORPHINE SULFATE 4 MG/ML IJ SOLN
4.0000 mg | INTRAMUSCULAR | Status: AC | PRN
  Administered 2011-08-17 (×2): 4 mg via INTRAVENOUS
  Filled 2011-08-17 (×2): qty 1

## 2011-08-17 MED ORDER — MORPHINE SULFATE 2 MG/ML IJ SOLN
2.0000 mg | INTRAMUSCULAR | Status: DC | PRN
  Administered 2011-08-17 – 2011-08-18 (×2): 2 mg via INTRAVENOUS
  Filled 2011-08-17 (×2): qty 1

## 2011-08-17 MED ORDER — KCL IN DEXTROSE-NACL 10-5-0.45 MEQ/L-%-% IV SOLN
INTRAVENOUS | Status: DC
  Administered 2011-08-17 – 2011-08-18 (×2): via INTRAVENOUS
  Filled 2011-08-17 (×2): qty 1000

## 2011-08-17 MED ORDER — ONDANSETRON HCL 4 MG PO TABS: 4.0000 mg | ORAL_TABLET | Freq: Four times a day (QID) | ORAL | Status: DC | PRN

## 2011-08-17 MED ORDER — IOHEXOL 300 MG/ML  SOLN
100.0000 mL | Freq: Once | INTRAMUSCULAR | Status: AC | PRN
  Administered 2011-08-17: 100 mL via INTRAVENOUS

## 2011-08-17 MED ORDER — HYDROMORPHONE HCL PF 1 MG/ML IJ SOLN
1.0000 mg | Freq: Once | INTRAMUSCULAR | Status: AC
  Administered 2011-08-17: 1 mg via INTRAVENOUS
  Filled 2011-08-17: qty 1

## 2011-08-17 MED ORDER — HYDROCORTISONE ACE-PRAMOXINE 2.5-1 % RE CREA
TOPICAL_CREAM | Freq: Three times a day (TID) | RECTAL | Status: DC
  Administered 2011-08-17: 23:00:00 via RECTAL
  Administered 2011-08-18: 1 via RECTAL
  Filled 2011-08-17: qty 30

## 2011-08-17 MED ORDER — HYDROCORTISONE ACE-PRAMOXINE 1-1 % RE CREA
TOPICAL_CREAM | Freq: Three times a day (TID) | RECTAL | Status: DC
  Filled 2011-08-17: qty 30

## 2011-08-17 MED ORDER — SODIUM CHLORIDE 0.45 % IV SOLN: Freq: Once | INTRAVENOUS | Status: DC

## 2011-08-17 MED ORDER — ALBUTEROL SULFATE (5 MG/ML) 0.5% IN NEBU: 2.5000 mg | INHALATION_SOLUTION | RESPIRATORY_TRACT | Status: DC | PRN

## 2011-08-17 MED ORDER — SODIUM CHLORIDE 0.9 % IV SOLN
80.0000 mg | INTRAVENOUS | Status: AC
  Administered 2011-08-17: 80 mg via INTRAVENOUS
  Filled 2011-08-17: qty 80

## 2011-08-17 MED ORDER — ONDANSETRON HCL 4 MG/2ML IJ SOLN
4.0000 mg | Freq: Four times a day (QID) | INTRAMUSCULAR | Status: DC | PRN
  Administered 2011-08-18: 4 mg via INTRAVENOUS
  Filled 2011-08-17: qty 2

## 2011-08-17 MED ORDER — SODIUM CHLORIDE 0.9 % IV BOLUS (SEPSIS)
1000.0000 mL | Freq: Once | INTRAVENOUS | Status: AC
  Administered 2011-08-17: 1000 mL via INTRAVENOUS

## 2011-08-17 MED ORDER — GUAIFENESIN-DM 100-10 MG/5ML PO SYRP: 5.0000 mL | ORAL_SOLUTION | ORAL | Status: DC | PRN

## 2011-08-17 MED ORDER — PANTOPRAZOLE SODIUM 40 MG PO TBEC
40.0000 mg | DELAYED_RELEASE_TABLET | Freq: Two times a day (BID) | ORAL | Status: DC
  Filled 2011-08-17 (×2): qty 1

## 2011-08-17 NOTE — H&P (Addendum)
[facility]                                                                                     Patient Demographics  Bobby Mcbride, is a 41 y.o. male  CSN: [id number]  MRN: [id number]  DOB - 02-04-1971  Admit Date - 08/17/2011  Outpatient Primary MD for the patient is Crawford Givens, MD, MD   With History of -  Past Medical History  Diagnosis Date  . Headache   . MVP (mitral valve prolapse)   . Lateral meniscus tear     Right  . Rectal bleeding       Past Surgical History  Procedure Date  . Elbow surgery 1987    Right  . Appendectomy 1990's  . Knee arthroscopy     Right  . Shoulder surgery 2012    right rotator cuff    in for   Chief Complaint  Patient presents with  . Rectal Bleeding  . Hematemesis     HPI  Bobby Mcbride  is a 41 y.o. male, he of headaches for which he was taking BC powder and Motrin on a regular basis, he simply being followed by Dr. Christella Hartigan gastroenterologist for bright red blood in stool which was thought to be secondary to hemorrhoids, however patient was scheduled for outpatient colonoscopy 2 weeks from today, results to the ER with one episode of vomiting blood this morning, has experienced periapical abdominal pain with some radiation to left lower quadrant which is sharp constant associated with hematemesis this morning, no aggrevating or relieving factors, where he was Hemoccult negative in the ER with a stable CT abdomen pelvis, case was discussed with GI who recommended admission for further workup.    Review of Systems    In addition to the HPI above,   No Fever-chills, No Headache, No changes with Vision or hearing, No problems swallowing food or Liquids, No Chest pain, Cough or Shortness of Breath, + Abdominal pain, + Nausea & Vommitting, Bowel movements are regular, No Blood in Urine, No dysuria, No new skin rashes or bruises, No new joints pains-aches,  No new weakness, tingling, numbness in any  extremity, 7 pound weight loss in 1 month, No polyuria, polydypsia or polyphagia, No significant Mental Stressors.  A full 10 point Review of Systems was done, except as stated above, all other Review of Systems were negative.   Social History History  Substance Use Topics  . Smoking status: Never Smoker   . Smokeless tobacco: Never Used  . Alcohol Use: No      Family History Family History  Problem Relation Age of Onset  . Hypertension Mother   . Stroke Mother   . Migraines Mother   . Hypertension Father   . Stroke Father   . Heart failure Father   . Asthma Father   . Colon cancer Neg Hx   . Prostate cancer Neg Hx   . Diabetes Mother   . Colon polyps Father       Prior to Admission medications   Medication Sig Start Date End Date Taking? Authorizing Provider  ibuprofen (ADVIL,MOTRIN) 200 MG tablet Take 200 mg  by mouth every 6 (six) hours as needed. Take 3 tablets every six hours as needed for pain   Yes Historical Provider, MD  pramoxine-hydrocortisone (PROCTOCREAM-HC) 1-1 % rectal cream Place rectally 3 (three) times daily. 08/12/11 08/22/11 Yes Rachael Fee, MD  MOVIPREP 100 G SOLR Take 1 kit (100 g total) by mouth as directed. Name brand only 08/12/11   Rachael Fee, MD    Allergies  Allergen Reactions  . Codeine     REACTION: Hallucinations  . Hydrocodone-Acetaminophen     REACTION: Hallucinations  . Compazine (Prochlorperazine Edisylate) Anxiety    Physical Exam  Vitals  Blood pressure 110/71, pulse 65, temperature 97.5 F (36.4 C), temperature source Oral, resp. rate 20, SpO2 100.00%.   1. General Young Caucasian male lying in bed in mild pain/dyscomfort  2. Normal affect and insight, Not Suicidal or Homicidal, Awake Alert, Oriented *3.  3. No F.N deficits, ALL C.Nerves Intact, Strength 5/5 all 4 extremities, Sensation intact all 4 extremities, Plantars down going.  4. Ears and Eyes appear Normal, Conjunctivae clear, PERRLA. Moist Oral  Mucosa.  5. Supple Neck, No JVD, No cervical lymphadenopathy appriciated, No Carotid Bruits.  6. Symmetrical Chest wall movement, Good air movement bilaterally, CTAB.  7. RRR, No Gallops, Rubs or Murmurs, No Parasternal Heave.  8. Positive Bowel Sounds, Abdomen Soft, diffusely tender, No organomegaly appriciated,No rebound -guarding or rigidity.  9.  No Cyanosis, Normal Skin Turgor, No Skin Rash or Bruise.  10. Good muscle tone,  joints appear normal , no effusions, Normal ROM.  11. No Palpable Lymph Nodes in Neck or Axillae     Data Review  CBC  Lab 08/17/11 1000 08/12/11 1002  WBC 11.4* 8.3  HGB 16.9 16.9  HCT 47.1 50.1  PLT 218 206.0  MCV 85.0 89.0  MCH 30.5 --  MCHC 35.9 33.8  RDW 12.6 13.1  LYMPHSABS 1.3 1.0  MONOABS 0.6 0.8  EOSABS 0.4 0.5  BASOSABS 0.0 0.0  BANDABS -- --   ------------------------------------------------------------------------------------------------------------------  Chemistries   Lab 08/17/11 1000 08/12/11 1002  NA 135 138  K 3.7 4.4  CL 100 101  CO2 25 32  GLUCOSE 95 66*  BUN 13 11  CREATININE 0.93 1.0  CALCIUM 9.2 9.2  MG -- --  AST -- 15  ALT -- 14  ALKPHOS -- 59  BILITOT -- 0.8   ------------------------------------------------------------------------------------------------------------------ CrCl is unknown because both a height and weight (above a minimum accepted value) are required for this calculation. ------------------------------------------------------------------------------------------------------------------ No results found for this basename: TSH,T4TOTAL,FREET3,T3FREE,THYROIDAB in the last 72 hours    Imaging results:   Ct Head Wo Contrast  08/12/2011  *RADIOLOGY REPORT*  Clinical Data: 41 year old male with intermittent headache and orbital pressure.  New onset rectal bleeding.  CT HEAD WITHOUT CONTRAST  Technique:  Contiguous axial images were obtained from the base of the skull through the vertex without  contrast.  Comparison: 03/01/2010.  Findings: Left maxillary sinus mucous retention cyst partially re- identified.  Other Visualized paranasal sinuses and mastoids are clear.  No acute osseous abnormality identified.  Visualized scalp soft tissues are within normal limits.  Visualized orbit soft tissues are within normal limits.  Small area of hypodensity along the posterior left sylvian fissure is unchanged and may represent a dilated perivascular space or congenital sulcal variation.  Normal cerebral volume.  No ventriculomegaly. No midline shift, mass effect, or evidence of mass lesion.  No acute intracranial hemorrhage identified.  No evidence of cortically based acute infarction  identified.  Wallace Cullens- white matter differentiation is within normal limits throughout the brain.  No suspicious intracranial vascular hyperdensity.  IMPRESSION: Stable and normal noncontrast CT appearance of the brain.  Original Report Authenticated By: Harley Hallmark, M.D.   Ct Abdomen Pelvis W Contrast  08/17/2011  *RADIOLOGY REPORT*  Clinical Data: Abdominal pain, left lower quadrant pain, rectal bleeding  CT ABDOMEN AND PELVIS WITH CONTRAST  Technique:  Multidetector CT imaging of the abdomen and pelvis was performed following the standard protocol during bolus administration of intravenous contrast.  Contrast: OMNIPAQUE IOHEXOL 300 MG/ML  SOLN  Comparison: CT pelvic report 09/27/2000 , no images available.  Findings: Sagittal images of the spine are unremarkable.  Minimal disc space flattening at L5 S1 level.  Lung bases are unremarkable.  Liver, spleen, pancreas and adrenal glands are unremarkable.  No calcified gallstones are noted in gallbladder.  No aortic aneurysm.  The kidneys are symmetrical in size and enhancement.  No hydronephrosis or hydroureter.  There is a cyst in the upper medial aspect of the right kidney measures 1.2 cm.  Delayed renal images shows bilateral renal symmetrical excretion.  Bilateral visualized  proximal ureter is unremarkable on delayed images.  No small bowel obstruction.  No ascites or free air.  No adenopathy.  No destructive bony lesions are noted within pelvis.  There is mild levoscoliosis.  No pericecal inflammation.  The patient is status post appendectomy.  Prostate gland and seminal vesicles are unremarkable.  The urinary bladder is unremarkable.  Some gas noted within rectum.  No distal colonic obstruction.  There is a tiny left inguinal canal hernia containing fat without evidence of acute complication.  No inguinal adenopathy is noted.  Small nonspecific bilateral inguinal lymph nodes.  Partially collapsed tortuous distal sigmoid colon prior to rectum.  IMPRESSION:  1.  No acute inflammatory process is identified within abdomen or pelvis. 2.  No hydronephrosis or hydroureter.  There is a cyst in upper medial aspect of the right kidney measures 1.2 cm. 3.  Mild levoscoliosis lumbar spine. 4.  No pericecal inflammation.  Status post appendectomy. 5.  No distal colonic obstruction.  Somewhat tortuous partially collapsed distal sigmoid colon.  Original Report Authenticated By: Natasha Mead, M.D.    Assessment & Plan     1. Perumblical abdominal pain with hematemesis this morning- and with significant exposure to [facility] powder and Motrin - patient could have peptic ulcer disease do to NSAID use, Jhis H& H. is stable he is not tachycardic, him on IV PPI drip, have counseled him to stop NSAID and BC powder use, will order a lactic acid level, lipase level, urine drug screen, UA, INR, PTT, will check EKG to assess baseline rhythm, EKG few weeks ago showed sinus rhythm and no acute changes ,GI to see and recommend further workup. Monitor H&H, type screen packed RBCs.   2. History of headaches- CT head unremarkable, I will set him up with neurology outpatient for further workup.   3. H/O hemorrhoids - continue hemorrhoidal cream recommended by GI.    DVT Prophylaxis SCDs   AM Labs Ordered,  also please review Full Orders  Admission, patients condition and plan of care including tests being ordered have been discussed with the patient and wife who indicate understanding and agree with the plan and Code Status.  Code Status Full  Condition Stable  Leroy Sea M.D on 08/17/2011 at 3:32 PM  Between 7am to 7pm - Pager - [phone]  After 7pm go to  [email or web address] - password [email or web address]  And look for the night coverage person covering me after hours  [facility] Office  [phone]

## 2011-08-17 NOTE — ED Notes (Signed)
Pt states he's had rectal bleeding x 1 month, states it is bright red blood, has been seeing Dr. Christella Hartigan for the rectal bleeding, had a colonostomy scheduled for 6/20, but this am started throwing up bright red blood. Called Dr. Christella Hartigan nurse and was told to come to the ED.

## 2011-08-17 NOTE — Telephone Encounter (Signed)
The pt called this morning and said he woke up at 1 am vomiting bright red blood and abd pain.  More blood than anything else.    Pt was advised to go to the ER for evaluation.  Pt states he does not want to go and would like me to speak with Dr Christella Hartigan first.  I spoke with Dr Christella Hartigan and he advised ER eval.  I called the pt back and gave Dr Christella Hartigan recommendation and the pt agreed to go.  He wanted to wait until 11 am after work but I advised him to go now and not wait.  He did agree to go.  Forwarded to Dr Christella Hartigan

## 2011-08-17 NOTE — Consult Note (Signed)
Patient seen, examined, and I agree with the above documentation, including the assessment and plan. Unclear source for abd pain, but CT is reassuring.  Pain seems out of proportion to objective findings. One isolated episode of hematemesis.  Pt asking for food now to avoid headache. Will plan EGD for the am to eval hematemesis. BID IV PPI for now. Supportive care, pain control.   I do not think he needs colonoscopy right now. Further recs to follow

## 2011-08-17 NOTE — ED Notes (Signed)
Patient transported to CT 

## 2011-08-17 NOTE — ED Notes (Signed)
Pt states he's having sharp/shooting pain in his abdomen, 10/10

## 2011-08-17 NOTE — ED Notes (Signed)
MD at bedside. 

## 2011-08-17 NOTE — Consult Note (Signed)
[facility] Consultation  Referring Provider: [facility] Primary Care Physician:  Crawford Givens, MD, MD Primary Gastroenterologist:   Rob Bunting, MD Reason for Consultation:  GI bleed, abdominal pain  HPI: Bobby Mcbride is a 41 y.o. male who recently saw Dr. Christella Hartigan for evaluation of rectal bleeding and weight loss. On exam he had a hemorrhoids and possibly a small fissure. Patient was prescribed sitz baths and topical medication. For further evaluation of bleeding he was scheduled for a colonoscopy.Patient presented to ED today for new onset periumbilical pain and LLQ pain associated with one episode of hematemesis.   No constipation or diarrhea. The pain woke him up at 1:30am. The episode of hematemesis occurred following onset of pain.  WBC is mildly elevated. BUN is normal as is his hemoglobin of 16.9. CTscan of abd/pelvis with contrast is unrevealing.   Past Medical History  Diagnosis Date  . Headache   . MVP (mitral valve prolapse)   . Lateral meniscus tear     Right  . Rectal bleeding     Past Surgical History  Procedure Date  . Elbow surgery 1987    Right  . Appendectomy 1990's  . Knee arthroscopy     Right  . Shoulder surgery 2012    right rotator cuff    Prior to Admission medications   Medication Sig Start Date End Date Taking? Authorizing Provider  ibuprofen (ADVIL,MOTRIN) 200 MG tablet Take 200 mg by mouth every 6 (six) hours as needed. Take 3 tablets every six hours as needed for pain   Yes Historical Provider, MD  pramoxine-hydrocortisone (PROCTOCREAM-HC) 1-1 % rectal cream Place rectally 3 (three) times daily. 08/12/11 08/22/11 Yes Rachael Fee, MD  MOVIPREP 100 G SOLR Take 1 kit (100 g total) by mouth as directed. Name brand only 08/12/11   Rachael Fee, MD    Current Facility-Administered Medications  Medication Dose Route Frequency Provider Last Rate Last Dose  . HYDROmorphone (DILAUDID) injection 1 mg  1 mg Intravenous Once Lisette  Paz, PA-C   1 mg at 08/17/11 1204  . HYDROmorphone (DILAUDID) injection 1 mg  1 mg Intravenous Once Lisette Paz, PA-C   1 mg at 08/17/11 1320  . iohexol (OMNIPAQUE) 300 MG/ML solution 100 mL  100 mL Intravenous Once PRN Medication Radiologist, MD   100 mL at 08/17/11 1316  . morphine 4 MG/ML injection 4 mg  4 mg Intravenous PRN Lisette Paz, PA-C   4 mg at 08/17/11 1126  . ondansetron (ZOFRAN) injection 4 mg  4 mg Intravenous PRN Lisette Paz, PA-C   4 mg at 08/17/11 1044  . pantoprazole (PROTONIX) 80 mg in sodium chloride 0.9 % 100 mL IVPB  80 mg Intravenous To ER Lisette Paz, PA-C   80 mg at 08/17/11 1529  . pantoprazole (PROTONIX) 80 mg in sodium chloride 0.9 % 250 mL infusion  8 mg/hr Intravenous Continuous Leroy Sea, MD      . sodium chloride 0.9 % bolus 1,000 mL  1,000 mL Intravenous Once Lisette Paz, PA-C   1,000 mL at 08/17/11 1032   Current Outpatient Prescriptions  Medication Sig Dispense Refill  . ibuprofen (ADVIL,MOTRIN) 200 MG tablet Take 200 mg by mouth every 6 (six) hours as needed. Take 3 tablets every six hours as needed for pain      . pramoxine-hydrocortisone (PROCTOCREAM-HC) 1-1 % rectal cream Place rectally 3 (three) times daily.  30 g  3  . MOVIPREP 100 G SOLR Take 1 kit (100  g total) by mouth as directed. Name brand only  1 kit  0    Allergies as of 08/17/2011 - Review Complete 08/17/2011  Allergen Reaction Noted  . Codeine    . Hydrocodone-acetaminophen    . Compazine (prochlorperazine edisylate) Anxiety 08/02/2011    Family History  Problem Relation Age of Onset  . Hypertension Mother   . Stroke Mother   . Migraines Mother   . Hypertension Father   . Stroke Father   . Heart failure Father   . Asthma Father   . Colon cancer Neg Hx   . Prostate cancer Neg Hx   . Diabetes Mother   . Colon polyps Father     History   Social History  . Marital Status: Married    Spouse Name: N/A    Number of Children: 1  . Years of Education: N/A   Occupational  History  . Firefighter, since 2002   .     Social History Main Topics  . Smoking status: Never Smoker   . Smokeless tobacco: Never Used  . Alcohol Use: No  . Drug Use: No  . Sexually Active: Not on file    Review of Systems: Complains of dysuria x 1 month. Urinary hesitancy in ED. All other systems reviewed and negative except where noted in HPI  PHYSICAL EXAM: Vital signs in last 24 hours: Temp:  [97.5 F (36.4 C)-98 F (36.7 C)] 97.5 F (36.4 C) (06/05 1211) Pulse Rate:  [65-76] 65  (06/05 1211) Resp:  [20] 20  (06/05 1211) BP: (110)/(71-84) 110/71 mmHg (06/05 1211) SpO2:  [98 %-100 %] 100 % (06/05 1211)   General:   Thin white male in NAD Head:  Normocephalic and atraumatic. Eyes:   No icterus.   Conjunctiva pink. Ears:  Normal auditory acuity. Neck:  Supple; no masses felt Lungs:  Respirations even and unlabored. Lungs clear to auscultation bilaterally.   No wheezes, crackles, or rhonchi.  Heart:  Regular rate and rhythm; no murmurs heard. Abdomen:  Soft, nondistended, periumbilical and LLQ tenderness to light palpation.  Normal bowel sounds. No appreciable masses or hepatomegaly.  Rectal:  Not repeated. Heme negative in ED.  Msk:  Symmetrical without gross deformities.  Extremities:  Without edema. Neurologic:  Alert and  oriented x4;  grossly normal neurologically. Skin:  Intact without significant lesions or rashes. Cervical Nodes:  No significant cervical adenopathy. Psych:  Alert and cooperative but flat affect.  LAB RESULTS:  Basename 08/17/11 1000  WBC 11.4*  HGB 16.9  HCT 47.1  PLT 218   BMET  Basename 08/17/11 1000  NA 135  K 3.7  CL 100  CO2 25  GLUCOSE 95  BUN 13  CREATININE 0.93  CALCIUM 9.2    Basename 08/17/11 1000  LABPROT 13.2  INR 0.98    STUDIES: Ct Abdomen Pelvis W Contrast  08/17/2011  *RADIOLOGY REPORT*  Clinical Data: Abdominal pain, left lower quadrant pain, rectal bleeding  CT ABDOMEN AND PELVIS WITH CONTRAST  Technique:   Multidetector CT imaging of the abdomen and pelvis was performed following the standard protocol during bolus administration of intravenous contrast.  Contrast: OMNIPAQUE IOHEXOL 300 MG/ML  SOLN  Comparison: CT pelvic report 09/27/2000 , no images available.  Findings: Sagittal images of the spine are unremarkable.  Minimal disc space flattening at L5 S1 level.  Lung bases are unremarkable.  Liver, spleen, pancreas and adrenal glands are unremarkable.  No calcified gallstones are noted in gallbladder.  No aortic  aneurysm.  The kidneys are symmetrical in size and enhancement.  No hydronephrosis or hydroureter.  There is a cyst in the upper medial aspect of the right kidney measures 1.2 cm.  Delayed renal images shows bilateral renal symmetrical excretion.  Bilateral visualized proximal ureter is unremarkable on delayed images.  No small bowel obstruction.  No ascites or free air.  No adenopathy.  No destructive bony lesions are noted within pelvis.  There is mild levoscoliosis.  No pericecal inflammation.  The patient is status post appendectomy.  Prostate gland and seminal vesicles are unremarkable.  The urinary bladder is unremarkable.  Some gas noted within rectum.  No distal colonic obstruction.  There is a tiny left inguinal canal hernia containing fat without evidence of acute complication.  No inguinal adenopathy is noted.  Small nonspecific bilateral inguinal lymph nodes.  Partially collapsed tortuous distal sigmoid colon prior to rectum.  IMPRESSION:  1.  No acute inflammatory process is identified within abdomen or pelvis. 2.  No hydronephrosis or hydroureter.  There is a cyst in upper medial aspect of the right kidney measures 1.2 cm. 3.  Mild levoscoliosis lumbar spine. 4.  No pericecal inflammation.  Status post appendectomy. 5.  No distal colonic obstruction.  Somewhat tortuous partially collapsed distal sigmoid colon.  Original Report Authenticated By: Natasha Mead, M.D.    PREVIOUS  ENDOSCOPIES: none. Scheduled for colonoscopy this month.  IMPRESSION / PLAN: 1. Acute periumbilical and LLQ pain. WBC mildly elevated but CT scan unremarkable.  2. Hematemesis X1 following onset of abdominal pain. No evidence for significant GI bleed. His BUN and hemoglobin are normal. Stool heme negative. Possibilities include the usuals (ulcers,erosive disease), etiology not clear. Probably EGD warranted for further evaluation. PPI BID.   3. Dysuria x 1 month. Rule out UTI.   4. Rectal bleeding of several weeks duration. He had a hemorrhoid and ? Anal fissure on exam in office but is for colonoscopy this month for further evaluation. Hemoglobin normal so obviously low volume bleed.       Thanks   LOS: 0 days   Willette Cluster  08/17/2011, 3:39 PM

## 2011-08-17 NOTE — ED Provider Notes (Signed)
History     CSN: [id number]  Arrival date & time 08/17/11  0940   First MD Initiated Contact with Patient 08/17/11 1007      Chief Complaint  Patient presents with  . Rectal Bleeding  . Hematemesis    (Consider location/radiation/quality/duration/timing/severity/associated sxs/prior treatment) HPI Comments: Patient is a 41 year old male with a history of mitral valve prolapse presents emergency department with a chief complaint of abdominal pain.  Patient is currently being followed by Dr. Christella Hartigan and has a scheduled colonoscopy for June 20. Pain began acutely at 130 a.m., located in the periumbilical and left lower quadrant, without radiation, constant and described as sharp pain, rated 10/10 in severity, associated with nausea, and hematemesis x1 (at 130 a.m.) soft stools, 6 weeks of bright red blood in stool, 2 episodes of black stool about 2 weeks ago, lightheadedness and occasional dizziness.  Patient is currently being treated for external rectal hemorrhoids and states that he does have rectal pain with defecation as well.  Patient denies any fevers, night sweats, chills, chest pain, easy bruising, syncope, chest pain, shortness of breath or dysphagia.  No other complaints at this time. Pt w appendectomy in the 90's., no other abdominal surgery   The history is provided by the patient.    Past Medical History  Diagnosis Date  . Headache   . MVP (mitral valve prolapse)   . Lateral meniscus tear     Right  . Rectal bleeding     Past Surgical History  Procedure Date  . Elbow surgery 1987    Right  . Appendectomy 1990's  . Knee arthroscopy     Right  . Shoulder surgery 2012    right rotator cuff    Family History  Problem Relation Age of Onset  . Hypertension Mother   . Stroke Mother   . Migraines Mother   . Hypertension Father   . Stroke Father   . Heart failure Father   . Asthma Father   . Colon cancer Neg Hx   . Prostate cancer Neg Hx   . Diabetes Mother   .  Colon polyps Father     History  Substance Use Topics  . Smoking status: Never Smoker   . Smokeless tobacco: Never Used  . Alcohol Use: No      Review of Systems  All other systems reviewed and are negative.    Allergies  Codeine; Hydrocodone-acetaminophen; and Compazine  Home Medications   Current Outpatient Rx  Name Route Sig Dispense Refill  . IBUPROFEN 200 MG PO TABS Oral Take 200 mg by mouth every 6 (six) hours as needed. Take 3 tablets every six hours as needed for pain    . HYDROCORTISONE ACE-PRAMOXINE 1-1 % RE CREA Rectal Place rectally 3 (three) times daily. 30 g 3  . MOVIPREP 100 G PO SOLR Oral Take 1 kit (100 g total) by mouth as directed. Name brand only 1 kit 0    Dispense as written.    BP 110/71  Pulse 65  Temp(Src) 97.5 F (36.4 C) (Oral)  Resp 20  SpO2 100%  Physical Exam  Nursing note and vitals reviewed. Constitutional: He is oriented to person, place, and time. He appears well-developed and well-nourished. No distress.  HENT:  Head: Normocephalic and atraumatic.       Oropharynx clear and moist, uvula midline, airway intact  Eyes: Conjunctivae and EOM are normal.  Neck: Normal range of motion.  Cardiovascular:       RRR,  no no apparent CN auscultation, intact distal pulses.  Pulmonary/Chest: Effort normal.       Lungs clear to auscultation bilaterally, no respiratory stress, accessory muscle use, or nasal flaring.  Abdominal:       Bowel sounds hyperactive, soft abdomen with severe tenderness to palpation over the periumbilical and left lower quadrant.  Non-pulsatile aorta.  No masses, abdominal rigidity or distension.   Musculoskeletal: Normal range of motion.  Neurological: He is alert and oriented to person, place, and time.  Skin: Skin is warm and dry. No rash noted. He is not diaphoretic.  Psychiatric: He has a normal mood and affect. His behavior is normal.    ED Course  Procedures (including critical care time)  Labs Reviewed    CBC - Abnormal; Notable for the following:    WBC 11.4 (*)    All other components within normal limits  DIFFERENTIAL - Abnormal; Notable for the following:    Neutrophils Relative 79 (*)    Neutro Abs 9.0 (*)    All other components within normal limits  BASIC METABOLIC PANEL  OCCULT BLOOD, POC DEVICE  PROTIME-INR  APTT  LIPASE, BLOOD   Ct Abdomen Pelvis W Contrast  08/17/2011  *RADIOLOGY REPORT*  Clinical Data: Abdominal pain, left lower quadrant pain, rectal bleeding  CT ABDOMEN AND PELVIS WITH CONTRAST  Technique:  Multidetector CT imaging of the abdomen and pelvis was performed following the standard protocol during bolus administration of intravenous contrast.  Contrast: OMNIPAQUE IOHEXOL 300 MG/ML  SOLN  Comparison: CT pelvic report 09/27/2000 , no images available.  Findings: Sagittal images of the spine are unremarkable.  Minimal disc space flattening at L5 S1 level.  Lung bases are unremarkable.  Liver, spleen, pancreas and adrenal glands are unremarkable.  No calcified gallstones are noted in gallbladder.  No aortic aneurysm.  The kidneys are symmetrical in size and enhancement.  No hydronephrosis or hydroureter.  There is a cyst in the upper medial aspect of the right kidney measures 1.2 cm.  Delayed renal images shows bilateral renal symmetrical excretion.  Bilateral visualized proximal ureter is unremarkable on delayed images.  No small bowel obstruction.  No ascites or free air.  No adenopathy.  No destructive bony lesions are noted within pelvis.  There is mild levoscoliosis.  No pericecal inflammation.  The patient is status post appendectomy.  Prostate gland and seminal vesicles are unremarkable.  The urinary bladder is unremarkable.  Some gas noted within rectum.  No distal colonic obstruction.  There is a tiny left inguinal canal hernia containing fat without evidence of acute complication.  No inguinal adenopathy is noted.  Small nonspecific bilateral inguinal lymph nodes.   Partially collapsed tortuous distal sigmoid colon prior to rectum.  IMPRESSION:  1.  No acute inflammatory process is identified within abdomen or pelvis. 2.  No hydronephrosis or hydroureter.  There is a cyst in upper medial aspect of the right kidney measures 1.2 cm. 3.  Mild levoscoliosis lumbar spine. 4.  No pericecal inflammation.  Status post appendectomy. 5.  No distal colonic obstruction.  Somewhat tortuous partially collapsed distal sigmoid colon.  Original Report Authenticated By: Natasha Mead, M.D.     No diagnosis found.    MDM  Abdominal pain  Consult made to gastroenterology who is to see patient in the emergency department for severe abdominal pain not relieved by Dilaudid and morphine.  Patient to be admitted to to internal medicine for further evaluation and pain management.  Patient has been  with vital signs stable throughout hospital visit, no evidence of anemia, Hemoccult negative, slight white count of 11.4 and imaging with no obvious obstruction or inflammatory disease. The patient appears reasonably stabilized for admission considering the current resources, flow, and capabilities available in the ED at this time, and I doubt any other [facility] requiring further screening and/or treatment in the ED prior to admission.          Jaci Carrel, [place] 08/17/11 1506

## 2011-08-17 NOTE — Telephone Encounter (Signed)
i agree, he needs ER evaluation with his hematemesis

## 2011-08-17 NOTE — ED Notes (Signed)
Attempted to call report first time. Nurse will call back.

## 2011-08-18 ENCOUNTER — Encounter (HOSPITAL_COMMUNITY)
Admission: EM | Disposition: A | Discharge status: Home / Self Care | Payer: Self-pay | Source: Home / Self Care | Attending: Emergency Medicine

## 2011-08-18 ENCOUNTER — Encounter (HOSPITAL_COMMUNITY): Payer: Self-pay

## 2011-08-18 DIAGNOSIS — K920 Hematemesis: Secondary | ICD-10-CM

## 2011-08-18 DIAGNOSIS — R109 Unspecified abdominal pain: Secondary | ICD-10-CM

## 2011-08-18 HISTORY — PX: ESOPHAGOGASTRODUODENOSCOPY: SHX5428

## 2011-08-18 LAB — BASIC METABOLIC PANEL
BUN: 8 mg/dL (ref 6–23)
GFR calc non Af Amer: >90 mL/min (ref >90)
Glucose, Bld: 95 mg/dL (ref 70–99)
Potassium: 4 mEq/L (ref 3.5–5.1)

## 2011-08-18 LAB — CBC
Hemoglobin: 15.1 g/dL (ref 13.0–17.0)
MCH: 29.3 pg (ref 26.0–34.0)
MCHC: 33.5 g/dL (ref 30.0–36.0)

## 2011-08-18 LAB — RAPID URINE DRUG SCREEN, HOSP PERFORMED
Amphetamines: NOT DETECTED
Benzodiazepines: NOT DETECTED
Cocaine: NOT DETECTED
Opiates: POSITIVE — AB

## 2011-08-18 LAB — URINALYSIS, ROUTINE W REFLEX MICROSCOPIC
Bilirubin Urine: NEGATIVE
Glucose, UA: NEGATIVE mg/dL
Hgb urine dipstick: NEGATIVE
Ketones, ur: NEGATIVE mg/dL
Protein, ur: NEGATIVE mg/dL

## 2011-08-18 SURGERY — EGD (ESOPHAGOGASTRODUODENOSCOPY)
Anesthesia: Moderate Sedation

## 2011-08-18 MED ORDER — FENTANYL NICU IV SYRINGE 50 MCG/ML
INJECTION | INTRAMUSCULAR | Status: DC | PRN
  Administered 2011-08-18 (×3): 25 ug via INTRAVENOUS

## 2011-08-18 MED ORDER — MIDAZOLAM HCL 10 MG/2ML IJ SOLN
INTRAMUSCULAR | Status: AC
  Filled 2011-08-18: qty 4

## 2011-08-18 MED ORDER — HYDROCODONE-ACETAMINOPHEN 5-500 MG PO TABS: 1.0000 | ORAL_TABLET | Freq: Four times a day (QID) | ORAL | Status: DC | PRN

## 2011-08-18 MED ORDER — DIPHENHYDRAMINE HCL 50 MG/ML IJ SOLN
INTRAMUSCULAR | Status: AC
Start: 2011-08-18 — End: 2011-08-18
  Filled 2011-08-18: qty 1

## 2011-08-18 MED ORDER — MIDAZOLAM HCL 10 MG/2ML IJ SOLN
INTRAMUSCULAR | Status: DC | PRN
  Administered 2011-08-18 (×2): 1 mg via INTRAVENOUS
  Administered 2011-08-18 (×2): 2 mg via INTRAVENOUS

## 2011-08-18 MED ORDER — DIPHENHYDRAMINE HCL 50 MG/ML IJ SOLN
INTRAMUSCULAR | Status: DC | PRN
  Administered 2011-08-18: 25 mg via INTRAVENOUS

## 2011-08-18 MED ORDER — TRAMADOL HCL 50 MG PO TABS
50.0000 mg | ORAL_TABLET | Freq: Four times a day (QID) | ORAL | Status: AC | PRN
Start: 2011-08-18 — End: 2011-08-28

## 2011-08-18 MED ORDER — PANTOPRAZOLE SODIUM 40 MG PO TBEC: 40.0000 mg | DELAYED_RELEASE_TABLET | Freq: Every day | ORAL | Status: DC

## 2011-08-18 MED ORDER — BUTAMBEN-TETRACAINE-BENZOCAINE 2-2-14 % EX AERO
INHALATION_SPRAY | CUTANEOUS | Status: DC | PRN
  Administered 2011-08-18: 2 via TOPICAL

## 2011-08-18 MED ORDER — PANTOPRAZOLE SODIUM 40 MG PO TBEC
40.0000 mg | DELAYED_RELEASE_TABLET | Freq: Every day | ORAL | Status: DC
  Filled 2011-08-18: qty 1

## 2011-08-18 MED ORDER — FENTANYL CITRATE 0.05 MG/ML IJ SOLN
INTRAMUSCULAR | Status: AC
  Filled 2011-08-18: qty 4

## 2011-08-18 NOTE — Progress Notes (Signed)
Subjective: Awake, alert, slightly irritable. Reports continued abdominal pain but less than yesterday. NAD  Objective: Vital signs Filed Vitals:   08/17/11 1211 08/17/11 1652 08/17/11 2107 08/18/11 0538  BP: 110/71 118/79 128/70 114/71  Pulse: 65 66 64 51  Temp: 97.5 F (36.4 C) 98 F (36.7 C) 97.9 F (36.6 C) 97.9 F (36.6 C)  TempSrc: Oral Oral Oral Oral  Resp: 20 18 16 16   Height:  5\' 7"  (1.702 m)    Weight:  64.411 kg (142 lb)  65.046 kg (143 lb 6.4 oz)  SpO2: 100% 99% 97% 97%   Weight change:  Last BM Date: 08/17/11  Intake/Output from previous day: 06/05 0701 - 06/06 0700 In: 1074.6 [P.O.:240; I.V.:834.6] Out: -      Physical Exam: General: Alert, awake, oriented x3, in no acute distress. HEENT: No bruits, no goiter. Normocephalic, PERRL, mucus membranes mouth slightly dry/pink.  Heart: Regular rate and rhythm, without murmurs, rubs, gallops. Lungs:Normal effort. Breath sounds clear to auscultation bilaterally. No wheeze. Abdomen: Soft, nondistended, mild tenderness throughout but particularly LLQ, positive bowel sounds. Extremities: No clubbing cyanosis or edema with positive pedal pulses. Neuro: Grossly intact, nonfocal.    Lab Results: Basic Metabolic Panel:  Basename 08/18/11 0430 08/17/11 1000  NA 134* 135  K 4.0 3.7  CL 99 100  CO2 29 25  GLUCOSE 95 95  BUN 8 13  CREATININE 0.99 0.93  CALCIUM 8.5 9.2  MG -- --  PHOS -- --   Liver Function Tests: No results found for this basename: AST:2,ALT:2,ALKPHOS:2,BILITOT:2,PROT:2,ALBUMIN:2 in the last 72 hours  Basename 08/17/11 1000  LIPASE 24  AMYLASE --   No results found for this basename: AMMONIA:2 in the last 72 hours CBC:  Basename 08/18/11 0430 08/17/11 2155 08/17/11 1000  WBC 8.2 -- 11.4*  NEUTROABS -- -- 9.0*  HGB 15.1 15.3 --  HCT 45.1 43.4 --  MCV 87.6 -- 85.0  PLT 184 -- 218   Cardiac Enzymes: No results found for this basename: CKTOTAL:3,CKMB:3,CKMBINDEX:3,TROPONINI:3 in the  last 72 hours BNP: No results found for this basename: PROBNP:3 in the last 72 hours D-Dimer: No results found for this basename: DDIMER:2 in the last 72 hours CBG: No results found for this basename: GLUCAP:6 in the last 72 hours Hemoglobin A1C: No results found for this basename: HGBA1C in the last 72 hours Fasting Lipid Panel: No results found for this basename: CHOL,HDL,LDLCALC,TRIG,CHOLHDL,LDLDIRECT in the last 72 hours Thyroid Function Tests: No results found for this basename: TSH,T4TOTAL,FREET4,T3FREE,THYROIDAB in the last 72 hours Anemia Panel: No results found for this basename: VITAMINB12,FOLATE,FERRITIN,TIBC,IRON,RETICCTPCT in the last 72 hours Coagulation:  Basename 08/17/11 1000  LABPROT 13.2  INR 0.98   Urine Drug Screen: Drugs of Abuse     Component Value Date/Time   LABOPIA POSITIVE* 08/18/2011 0610   COCAINSCRNUR NONE DETECTED 08/18/2011 0610   LABBENZ NONE DETECTED 08/18/2011 0610   AMPHETMU NONE DETECTED 08/18/2011 0610   THCU NONE DETECTED 08/18/2011 0610   LABBARB NONE DETECTED 08/18/2011 0610    Alcohol Level: No results found for this basename: ETH:2 in the last 72 hours Urinalysis:  Basename 08/18/11 0610  COLORURINE YELLOW  LABSPEC 1.028  PHURINE 6.0  GLUCOSEU NEGATIVE  HGBUR NEGATIVE  BILIRUBINUR NEGATIVE  KETONESUR NEGATIVE  PROTEINUR NEGATIVE  UROBILINOGEN 0.2  NITRITE NEGATIVE  LEUKOCYTESUR NEGATIVE   Misc. Labs: No results found for this or any previous visit (from the past 240 hour(s)).  Studies/Results: Ct Abdomen Pelvis W Contrast  08/17/2011  *RADIOLOGY REPORT*  Clinical Data: Abdominal pain, left lower quadrant pain, rectal bleeding  CT ABDOMEN AND PELVIS WITH CONTRAST  Technique:  Multidetector CT imaging of the abdomen and pelvis was performed following the standard protocol during bolus administration of intravenous contrast.  Contrast: OMNIPAQUE IOHEXOL 300 MG/ML  SOLN  Comparison: CT pelvic report 09/27/2000 , no images  available.  Findings: Sagittal images of the spine are unremarkable.  Minimal disc space flattening at L5 S1 level.  Lung bases are unremarkable.  Liver, spleen, pancreas and adrenal glands are unremarkable.  No calcified gallstones are noted in gallbladder.  No aortic aneurysm.  The kidneys are symmetrical in size and enhancement.  No hydronephrosis or hydroureter.  There is a cyst in the upper medial aspect of the right kidney measures 1.2 cm.  Delayed renal images shows bilateral renal symmetrical excretion.  Bilateral visualized proximal ureter is unremarkable on delayed images.  No small bowel obstruction.  No ascites or free air.  No adenopathy.  No destructive bony lesions are noted within pelvis.  There is mild levoscoliosis.  No pericecal inflammation.  The patient is status post appendectomy.  Prostate gland and seminal vesicles are unremarkable.  The urinary bladder is unremarkable.  Some gas noted within rectum.  No distal colonic obstruction.  There is a tiny left inguinal canal hernia containing fat without evidence of acute complication.  No inguinal adenopathy is noted.  Small nonspecific bilateral inguinal lymph nodes.  Partially collapsed tortuous distal sigmoid colon prior to rectum.  IMPRESSION:  1.  No acute inflammatory process is identified within abdomen or pelvis. 2.  No hydronephrosis or hydroureter.  There is a cyst in upper medial aspect of the right kidney measures 1.2 cm. 3.  Mild levoscoliosis lumbar spine. 4.  No pericecal inflammation.  Status post appendectomy. 5.  No distal colonic obstruction.  Somewhat tortuous partially collapsed distal sigmoid colon.  Original Report Authenticated By: Natasha Mead, M.D.    Medications: Scheduled Meds:   . sodium chloride   Intravenous Once  . hydrocortisone-pramoxine   Rectal TID  .  HYDROmorphone (DILAUDID) injection  1 mg Intravenous Once  .  HYDROmorphone (DILAUDID) injection  1 mg Intravenous Once  . pantoprazole (PROTONIX) IV  80 mg  Intravenous To ER  . pantoprazole  40 mg Oral BID AC  . sodium chloride  1,000 mL Intravenous Once  . DISCONTD: pramoxine-hydrocortisone   Rectal TID  . DISCONTD: pramoxine-hydrocortisone   Rectal TID   Continuous Infusions:   . dextrose 5 % and 0.45 % NaCl with KCl 10 mEq/L 75 mL/hr at 08/18/11 0610  . DISCONTD: pantoprozole (PROTONIX) infusion 8 mg/hr (08/17/11 1631)   PRN Meds:.albuterol, guaiFENesin-dextromethorphan, iohexol, morphine injection, morphine injection, ondansetron, ondansetron (ZOFRAN) IV, ondansetron  Assessment/Plan:  Principal Problem:  *Abdominal pain Active Problems:  Blood in stool  Headache  Hematemesis/vomiting blood 1. Perumblical abdominal pain with hematemesis- and with significant exposure to [facility] powder and Motrin -concern for  peptic ulcer disease do to NSAID use.CT shows no obstruction or inflammation. Hg stable at 15. Continue IV PPI  Monitor H&H. EGD this am. Appreciate GI assistance.   PT/INR and lactic acid WNL. No further hematemesis. VSS 2. History of headaches- CT head unremarkable. I will set him up with neurology outpatient for further workup.  3. H/O hemorrhoids - continue hemorrhoidal cream recommended by GI.  DVT Prophylaxis SCDs  4. Code status -full    LOS: 1 day   [facility] M 08/18/2011, 7:49 AM

## 2011-08-18 NOTE — Care Management Note (Signed)
    Page 1 of 1   08/18/2011     12:20:46 PM   CARE MANAGEMENT NOTE 08/18/2011  Patient:  Bobby Mcbride, Bobby Mcbride   Account Number:  [id number]  Date Initiated:  08/18/2011  Documentation initiated by:  Lanier Clam  Subjective/Objective Assessment:   ADMITTED W/ABD PAIN,HEMATEMESIS     Action/Plan:   FROM HOME   Anticipated DC Date:  08/18/2011   Anticipated DC Plan:  HOME/SELF CARE      DC Planning Services  CM consult      Choice offered to / List presented to:             Status of service:  Completed, signed off Medicare Important Message given?   (If response is "NO", the following Medicare IM given date fields will be blank) Date Medicare IM given:   Date Additional Medicare IM given:    Discharge Disposition:  HOME/SELF CARE  Per UR Regulation:  Reviewed for med. necessity/level of care/duration of stay  If discussed at Long Length of Stay Meetings, dates discussed:    Comments:  08/18/11 [facility] RN,BSN NCM [phone]

## 2011-08-18 NOTE — Discharge Instructions (Signed)
Follow with Primary MD Crawford Givens, MD, MD in 3-4  days , follow with Dr. Tonette Lederer doctor in a week and follow here H. pylori results with him.   You should also follow with neurologist for headache workup and urologist for small kidney cyst workup.   Get CBC, CMP, checked 7 days by Primary MD and again as instructed by your Primary MD.   Get Medicines reviewed and adjusted. Stopped using BC powder, Goody powder, Motrin, ibuprofen etc.  Please request your Prim.MD to go over all Hospital Tests and Procedure/Radiological results at the follow up, please get all Hospital records sent to your Prim MD by signing hospital release before you go home.  Activity: As tolerated with Full fall precautions use walker/cane & assistance as needed.  Diet: Heart Healthy  For Heart failure patients - Check your Weight same time everyday, if you gain over 2 pounds, or you develop in leg swelling, experience more shortness of breath or chest pain, call your Primary MD immediately. Follow Cardiac Low Salt Diet and 1.8 lit/day fluid restriction.  Disposition Home  If you experience worsening of your admission symptoms, develop shortness of breath, life threatening emergency, suicidal or homicidal thoughts you must seek medical attention immediately by calling 911 or calling your MD immediately  if symptoms less severe.  You Must read complete instructions/literature along with all the possible adverse reactions/side effects for all the Medicines you take and that have been prescribed to you. Take any new Medicines after you have completely understood and accpet all the possible adverse reactions/side effects.   Do not drive if your were admitted for syncope or siezures until you have seen by Primary MD or a Neurologist and advised to drive.  Do not drive when taking Pain medications.    Do not take more than prescribed Pain, Sleep and Anxiety Medications  Special Instructions: If you have smoked or  chewed Tobacco  in the last 2 yrs please stop smoking, stop any regular Alcohol  and or any Recreational drug use.  Wear Seat belts while driving.    Bobby Mcbride was admitted to the Hospital on 08/17/2011 and Discharged on Discharge Date 08/18/2011 and should be excused from work  for 5 days starting 08/17/2011 , may return to work/school without any restrictions.  Call Susa Raring MD, [facility] [phone] with questions.  Leroy Sea M.D on 08/18/2011,at 11:27 AM  [facility] Office  [phone]

## 2011-08-18 NOTE — Op Note (Signed)
[facility] [address] [place], [place]  [address]  ENDOSCOPY PROCEDURE REPORT  PATIENT:  Bobby Mcbride, Bobby Mcbride  MR#:  [id number] BIRTHDATE:  May 25, 1970, 40 yrs. old  GENDER:  male ENDOSCOPIST:  Carie Caddy. Aleni Andrus, MD Referred by:  [facility] PROCEDURE DATE:  08/18/2011 PROCEDURE:  EGD with biopsy, [id number] ASA CLASS:  Class II INDICATIONS:  hematemesis MEDICATIONS:   Benadryl 25 mg IV, Fentanyl 75 mcg IV, Versed 6 mg IV TOPICAL ANESTHETIC:  Cetacaine Spray  DESCRIPTION OF PROCEDURE:   After the risks benefits and alternatives of the procedure were thoroughly explained, informed consent was obtained.  The Pentax Gastroscope [id number] endoscope was introduced through the mouth and advanced to the second portion of the duodenum, without limitations.  The instrument was slowly withdrawn as the mucosa was fully examined. <<PROCEDUREIMAGES>>  Very mild nodular mucosa was found at the gastroesophageal junction. With standard forceps, a biopsy was obtained and sent to pathology.  A small, non-bleeding Mallory-Weiss tear at the gastroesophageal junction.  The stomach was entered and closely examined. The antrum, angularis, and lesser curvature were well visualized, including a retroflexed view of the cardia and fundus. The stomach wall was normally distensable. The scope passed easily through the pylorus into the duodenum.  Mild duodenitis without ulceration was found in the bulb of the duodenum.  Normal otherwise in the second portion of the duodenum.    Retroflexed views revealed no abnormalities.    The scope was then withdrawn from the patient and the procedure completed.  COMPLICATIONS:  None  ENDOSCOPIC IMPRESSION: 1) Mildly nodular mucosa at the gastroesophageal junction. Biopsies obtained and sent to pathology. 2) Mallory-Weiss tear at the gastroesophageal junction, likely the etiology of recent hematemesis. 3) Normal stomach 4) Mild duodenitis in the bulb of  duodenum 5) Normal in the second portion duodenum  RECOMMENDATIONS: 1) Await pathology results 2) Daily PPI therapy for now given mild duodenitis. 3) Check H. Pylori serum antibody and treat if positive  Carie Caddy. Bobby Mcbride Mcneel, MD  CC:  The Patient  n. eSIGNED:   Carie Caddy. Johnnisha Forton at 08/18/2011 09:26 AM  Darcus Pester, [phone]

## 2011-08-18 NOTE — Discharge Summary (Addendum)
[facility]                                                                                   Bobby Mcbride, is a 41 y.o. male  DOB January 08, 1971  MRN [id number].  Admission date:  08/17/2011  Discharge Date:  08/18/2011  Primary MD  Crawford Givens, MD, MD  Admitting Physician  Leroy Sea, MD  Admission Diagnosis  Abdominal pain [789.00] vomitting blood hematemesis  Discharge Diagnosis     Principal Problem:  *Abdominal pain Active Problems:  Blood in stool  Headache  Hematemesis/vomiting blood    Past Medical History  Diagnosis Date  . Headache   . MVP (mitral valve prolapse)   . Lateral meniscus tear     Right  . Rectal bleeding     Past Surgical History  Procedure Date  . Elbow surgery 1987    Right  . Appendectomy 1990's  . Knee arthroscopy     Right  . Shoulder surgery 2012    right rotator cuff     Hospital Course See H&P, Labs, Consult and Test reports for all details in brief, patient was admitted for abdominal pain with hematemesis, EGD suggestive of duodenitis and Mallory-Weiss tear, patient had significant exposure to NSAIDs and Goody powder, he has been counseled to stop both. His H&H remained stable. He is currently feeling better. Case was discussed with GI physician Dr. Sharla Kidney patient is okay for discharge on once a day PPI, he will followup with his family doctor and Dr. Christella Hartigan for final results on H. pylori antibody testing. His admission CT abdomen pelvis, lipase and like it unremarkable.   He should also has history of headaches, I have requested him to follow with neurology in one to 2 weeks for followup and further workup of headaches. Of note patient is having some family stress and family issues, he is not suicidal homicidal and he does not want any formal help her intervention at this time.   He was found to have incidental cyst in his kidney for which outpatient renal ultrasound and urology followup is  recommended.   Note after the discharge process patient despite initially agreeing to taking Lortab now says that he cannot tolerate Lortab as it makes him loopy, I have told him to take over-the-counter Tylenol and Ultram for pain control and follow with his family physician. At this point if he cannot tolerate low dose Lortab I will not be comfortable in prescribing him stronger narcotics. Lortab prescription was canceled. Ultram prescribed.   Consults G.I Dr Rhea Belton  Significant Tests:  See full reports for all details    EGD  ENDOSCOPIC IMPRESSION:  1) Mildly nodular mucosa at the gastroesophageal junction. Biopsies obtained and sent to pathology.  2) Mallory-Weiss tear at the gastroesophageal junction, likely the etiology of recent hematemesis.  3) Normal stomach  4) Mild duodenitis in the bulb of duodenum  5) Normal in the second portion duodenum   RECOMMENDATIONS:  1) Await pathology results  2) Daily PPI therapy for now given mild duodenitis.  3) Check H. Pylori serum antibody and treat if positive    Ct Head Wo Contrast  08/12/2011  *RADIOLOGY REPORT*  Clinical Data: 41 year old male with intermittent headache and orbital pressure.  New onset rectal bleeding.  CT HEAD WITHOUT CONTRAST  Technique:  Contiguous axial images were obtained from the base of the skull through the vertex without contrast.  Comparison: 03/01/2010.  Findings: Left maxillary sinus mucous retention cyst partially re- identified.  Other Visualized paranasal sinuses and mastoids are clear.  No acute osseous abnormality identified.  Visualized scalp soft tissues are within normal limits.  Visualized orbit soft tissues are within normal limits.  Small area of hypodensity along the posterior left sylvian fissure is unchanged and may represent a dilated perivascular space or congenital sulcal variation.  Normal cerebral volume.  No ventriculomegaly. No midline shift, mass effect, or evidence of mass lesion.  No  acute intracranial hemorrhage identified.  No evidence of cortically based acute infarction identified.  Wallace Cullens- white matter differentiation is within normal limits throughout the brain.  No suspicious intracranial vascular hyperdensity.  IMPRESSION: Stable and normal noncontrast CT appearance of the brain.  Original Report Authenticated By: Harley Hallmark, M.D.   Ct Abdomen Pelvis W Contrast  08/17/2011  *RADIOLOGY REPORT*  Clinical Data: Abdominal pain, left lower quadrant pain, rectal bleeding  CT ABDOMEN AND PELVIS WITH CONTRAST  Technique:  Multidetector CT imaging of the abdomen and pelvis was performed following the standard protocol during bolus administration of intravenous contrast.  Contrast: OMNIPAQUE IOHEXOL 300 MG/ML  SOLN  Comparison: CT pelvic report 09/27/2000 , no images available.  Findings: Sagittal images of the spine are unremarkable.  Minimal disc space flattening at L5 S1 level.  Lung bases are unremarkable.  Liver, spleen, pancreas and adrenal glands are unremarkable.  No calcified gallstones are noted in gallbladder.  No aortic aneurysm.  The kidneys are symmetrical in size and enhancement.  No hydronephrosis or hydroureter.  There is a cyst in the upper medial aspect of the right kidney measures 1.2 cm.  Delayed renal images shows bilateral renal symmetrical excretion.  Bilateral visualized proximal ureter is unremarkable on delayed images.  No small bowel obstruction.  No ascites or free air.  No adenopathy.  No destructive bony lesions are noted within pelvis.  There is mild levoscoliosis.  No pericecal inflammation.  The patient is status post appendectomy.  Prostate gland and seminal vesicles are unremarkable.  The urinary bladder is unremarkable.  Some gas noted within rectum.  No distal colonic obstruction.  There is a tiny left inguinal canal hernia containing fat without evidence of acute complication.  No inguinal adenopathy is noted.  Small nonspecific bilateral inguinal  lymph nodes.  Partially collapsed tortuous distal sigmoid colon prior to rectum.  IMPRESSION:  1.  No acute inflammatory process is identified within abdomen or pelvis. 2.  No hydronephrosis or hydroureter.  There is a cyst in upper medial aspect of the right kidney measures 1.2 cm. 3.  Mild levoscoliosis lumbar spine. 4.  No pericecal inflammation.  Status post appendectomy. 5.  No distal colonic obstruction.  Somewhat tortuous partially collapsed distal sigmoid colon.  Original Report Authenticated By: Natasha Mead, M.D.     Today   Subjective:   Bobby Mcbride today has no headache,no chest pain,no new weakness tingling or numbness, feels much better wants to go home today. Abdominal pain is much better.  Objective:   Blood pressure 118/75, pulse 51, temperature 98.2 F (36.8 C), temperature source Oral, resp. rate 8, height 5\' 7"  (1.702 m), weight 65.046 kg (143 lb 6.4 oz), SpO2 96.00%.  Intake/Output Summary (Last 24 hours) at 08/18/11 1141 Last data filed at 08/18/11 0940  Gross per 24 hour  Intake 1074.58 ml  Output    600 ml  Net 474.58 ml    Exam Awake Alert, Oriented *3, No new F.N deficits, Normal affect [place].AT,PERRAL Supple Neck,No JVD, No cervical lymphadenopathy appriciated.  Symmetrical Chest wall movement, Good air movement bilaterally, CTAB RRR,No Gallops,Rubs or new Murmurs, No Parasternal Heave +ve B.Sounds, Abd Soft, Non tender, No organomegaly appriciated, No rebound -guarding or rigidity. No Cyanosis, Clubbing or edema, No new Rash or bruise  Data Review     CBC w Diff: Lab Results  Component Value Date   WBC 8.2 08/18/2011   HGB 15.1 08/18/2011   HCT 45.1 08/18/2011   PLT 184 08/18/2011   LYMPHOPCT 12 08/17/2011   MONOPCT 6 08/17/2011   EOSPCT 4 08/17/2011   BASOPCT 0 08/17/2011    CMP: Lab Results  Component Value Date   NA 134* 08/18/2011   K 4.0 08/18/2011   CL 99 08/18/2011   CO2 29 08/18/2011   BUN 8 08/18/2011   CREATININE 0.99 08/18/2011   PROT 7.0 08/12/2011    ALBUMIN 4.1 08/12/2011   BILITOT 0.8 08/12/2011   ALKPHOS 59 08/12/2011   AST 15 08/12/2011   ALT 14 08/12/2011  .   Discharge Instructions     Follow with Primary MD Crawford Givens, MD, MD in 3-4  days , follow with Dr. Tonette Lederer doctor in a week and follow here H. pylori results with him.  Get CBC, CMP, checked 7 days by Primary MD and again as instructed by your Primary MD.   Bonita Quin should also follow with neurologist for headache workup and urologist for small kidney cyst workup.  Get Medicines reviewed and adjusted. Stopped using BC powder, Goody powder, Motrin, ibuprofen etc.  Please request your Prim.MD to go over all Hospital Tests and Procedure/Radiological results at the follow up, please get all Hospital records sent to your Prim MD by signing hospital release before you go home.  Activity: As tolerated with Full fall precautions use walker/cane & assistance as needed.  Diet: Heart Healthy  For Heart failure patients - Check your Weight same time everyday, if you gain over 2 pounds, or you develop in leg swelling, experience more shortness of breath or chest pain, call your Primary MD immediately. Follow Cardiac Low Salt Diet and 1.8 lit/day fluid restriction.  Disposition Home  If you experience worsening of your admission symptoms, develop shortness of breath, life threatening emergency, suicidal or homicidal thoughts you must seek medical attention immediately by calling 911 or calling your MD immediately  if symptoms less severe.  You Must read complete instructions/literature along with all the possible adverse reactions/side effects for all the Medicines you take and that have been prescribed to you. Take any new Medicines after you have completely understood and accpet all the possible adverse reactions/side effects.   Do not drive if your were admitted for syncope or siezures until you have seen by Primary MD or a Neurologist and advised to drive.  Do not drive when  taking Pain medications.    Do not take more than prescribed Pain, Sleep and Anxiety Medications  Special Instructions: If you have smoked or chewed Tobacco  in the last 2 yrs please stop smoking, stop any regular Alcohol  and or any Recreational drug use.  Wear Seat belts while driving.    Rehan Holness was admitted to the Hospital on  08/17/2011 and Discharged on Discharge Date 08/18/2011 and should be excused from work  for 5 days starting 08/17/2011 , may return to work/school without any restrictions.  Call Susa Raring MD, [facility] [phone] with questions.  Leroy Sea M.D on 08/18/2011,at 11:27 AM  [facility] Office  [phone]    Follow-up Information    Follow up with Crawford Givens, MD. Schedule an appointment as soon as possible for a visit in 3 days.   Contact information:   [address] [place] [address] [phone]       Follow up with Rob Bunting, MD. Schedule an appointment as soon as possible for a visit in 1 week.   Contact information:   [address] [place] [address] [phone]       Follow up with Gates Rigg, MD. Schedule an appointment as soon as possible for a visit in 2 weeks.   Contact information:   [address] [facility] [place] [address] [phone]       Follow up with NESI,MARC-HENRY, MD. Schedule an appointment as soon as possible for a visit in 2 weeks.   Contact information:   [address] [facility] [place] [address] [phone]          Discharge Medications    Bobby Mcbride, Bobby Mcbride  Home Medication Instructions JJO:[id number]   Printed on:08/18/11 1311  Medication Information                    MOVIPREP 100 G SOLR Take 1 kit (100 g total) by mouth as directed. Name brand only            pramoxine-hydrocortisone (PROCTOCREAM-HC) 1-1 % rectal cream Place rectally 3 (three) times daily.           pantoprazole (PROTONIX) 40 MG tablet Take 1 tablet (40 mg total) by mouth daily.           traMADol (ULTRAM) 50 MG tablet Take 1 tablet (50 mg total) by mouth every 6 (six) hours as needed for pain.              Total Time in preparing paper work, data evaluation and todays exam - 35 minutes  Leroy Sea M.D on 08/18/2011 at 11:41 AM  [facility] Office  [phone]

## 2011-08-18 NOTE — Progress Notes (Signed)
I have directly reviewed the clinical findings, lab, imaging studies and management of this patient in detail. I have interviewed and examined the patient and agree with the documentation,  as recorded by the Physician extender.  Leroy Sea M.D on 08/18/2011 at 12:54 PM  [facility] Office  [phone]

## 2011-08-19 ENCOUNTER — Encounter (HOSPITAL_COMMUNITY): Payer: Self-pay | Admitting: Internal Medicine

## 2011-08-19 ENCOUNTER — Encounter (HOSPITAL_COMMUNITY): Payer: Self-pay

## 2011-08-19 LAB — URINE CULTURE
Colony Count: NO GROWTH
Culture: NO GROWTH

## 2011-08-20 NOTE — ED Provider Notes (Signed)
  I performed a history and physical examination of Bobby Mcbride and discussed his management withMs. Paz  I agree with the history, physical, assessment, and plan of care, with the following exceptions: None  I was present for the following procedures: None Time Spent in Critical Care of the patient: None Time spent in discussions with the patient and family:5 Bobby Mcbride Corlis Leak, MD 08/20/11 [phone]

## 2011-08-21 ENCOUNTER — Encounter: Payer: Self-pay | Admitting: Internal Medicine

## 2011-08-23 ENCOUNTER — Ambulatory Visit (INDEPENDENT_AMBULATORY_CARE_PROVIDER_SITE_OTHER): Payer: BC Managed Care – PPO | Admitting: Family Medicine

## 2011-08-23 ENCOUNTER — Encounter: Payer: Self-pay | Admitting: Family Medicine

## 2011-08-23 VITALS — BP 104/70 | HR 57 | Temp 97.7°F | Wt 140.0 lb

## 2011-08-23 DIAGNOSIS — R51 Headache: Secondary | ICD-10-CM

## 2011-08-23 DIAGNOSIS — Q619 Cystic kidney disease, unspecified: Secondary | ICD-10-CM

## 2011-08-23 DIAGNOSIS — N281 Cyst of kidney, acquired: Secondary | ICD-10-CM | POA: Insufficient documentation

## 2011-08-23 DIAGNOSIS — K920 Hematemesis: Secondary | ICD-10-CM

## 2011-08-23 DIAGNOSIS — K921 Melena: Secondary | ICD-10-CM

## 2011-08-23 MED ORDER — SUCRALFATE 1 GM/10ML PO SUSP: 1.0000 g | Freq: Four times a day (QID) | ORAL | Status: DC

## 2011-08-23 NOTE — Progress Notes (Signed)
Mallory weiss tear on EGD and still with abd pain, though improved from prev. Currently on tramadol and PPI.  Avoiding NSAIDS.  Still with rectal bleeding and CBC pending.  Scheduled for GI f/u and colonoscopy.  No more vomiting, hematemesis.    Migraine HA.  Throbbing HA with photophobia with antecedent aura worse off nsaids.  Usually right sided.  Some better now than prev.  Noted that prev head CT unremarkable.  HA tend to be worse in stressful social situations.    Known renal cyst with normal Cr.  He has f/u with uro pending.  Still with some dysuria though no blood in urine to gross exam by patient.  He had neg ucx prev.    All d/w pt.   PMH and SH reviewed  ROS: See HPI, otherwise noncontributory.  Meds, vitals, and allergies reviewed.   nad ncat Tm wnl Nasal and OP exam wnl Neck supple rrr ctab abd soft, ttp near the epigastrum w/o rebound Normal BS Ext w/o edema CN 2-12 wnl B, S/S/DTR wnl x4

## 2011-08-23 NOTE — Assessment & Plan Note (Signed)
Likely migraine with nsaid withdrawal component. Avoid nsaids, f/u with uro and GI and then neuro if sx persist.  He agrees.

## 2011-08-23 NOTE — Assessment & Plan Note (Signed)
Improved but still with abd pain.  Add on carafate and f/u with GI.

## 2011-08-23 NOTE — Assessment & Plan Note (Signed)
Colonoscopy pending

## 2011-08-23 NOTE — Patient Instructions (Addendum)
Take the sucralfate/carafate 4 times a day and see if that helps.  Stay off the ibuprofen/BC/aspirin in the meantime.  Keep the appointment with GI and urology.  If the headaches continue, then I would see neurology.  Take care.  We'll contact you with your lab report.

## 2011-08-23 NOTE — Assessment & Plan Note (Signed)
uro f/u pending.  >25 min spent with face to face with patient, >50% counseling and/or coordinating care in total.

## 2011-08-24 LAB — CBC WITH DIFFERENTIAL/PLATELET
Basophils Relative: 0.2 % (ref 0.0–3.0)
Eosinophils Relative: 6.2 % — ABNORMAL HIGH (ref 0.0–5.0)
MCV: 90.5 fl (ref 78.0–100.0)
Monocytes Absolute: 0.8 10*3/uL (ref 0.1–1.0)
Monocytes Relative: 8.3 % (ref 3.0–12.0)
Neutrophils Relative %: 69.8 % (ref 43.0–77.0)
RBC: 5.43 Mil/uL (ref 4.22–5.81)
WBC: 9.9 10*3/uL (ref 4.5–10.5)

## 2011-08-25 ENCOUNTER — Encounter: Payer: Self-pay | Admitting: *Deleted

## 2011-08-26 ENCOUNTER — Emergency Department (HOSPITAL_COMMUNITY)
Admission: EM | Admit: 2011-08-26 | Discharge: 2011-08-26 | Disposition: A | Discharge status: Home / Self Care | Payer: BC Managed Care – PPO | Attending: Emergency Medicine | Admitting: Emergency Medicine

## 2011-08-26 ENCOUNTER — Encounter (HOSPITAL_COMMUNITY): Payer: Self-pay

## 2011-08-26 ENCOUNTER — Emergency Department (HOSPITAL_COMMUNITY): Payer: BC Managed Care – PPO

## 2011-08-26 ENCOUNTER — Telehealth: Payer: Self-pay | Admitting: Gastroenterology

## 2011-08-26 DIAGNOSIS — K2990 Gastroduodenitis, unspecified, without bleeding: Secondary | ICD-10-CM

## 2011-08-26 DIAGNOSIS — K920 Hematemesis: Secondary | ICD-10-CM

## 2011-08-26 DIAGNOSIS — K226 Gastro-esophageal laceration-hemorrhage syndrome: Secondary | ICD-10-CM | POA: Insufficient documentation

## 2011-08-26 DIAGNOSIS — K2970 Gastritis, unspecified, without bleeding: Secondary | ICD-10-CM | POA: Insufficient documentation

## 2011-08-26 DIAGNOSIS — K2980 Duodenitis without bleeding: Secondary | ICD-10-CM | POA: Insufficient documentation

## 2011-08-26 LAB — BASIC METABOLIC PANEL
BUN: 10 mg/dL (ref 6–23)
CO2: 29 mEq/L (ref 19–32)
Calcium: 9.1 mg/dL (ref 8.4–10.5)
Creatinine, Ser: 1.02 mg/dL (ref 0.50–1.35)
GFR calc non Af Amer: 89 mL/min — ABNORMAL LOW (ref >90)
Glucose, Bld: 93 mg/dL (ref 70–99)

## 2011-08-26 LAB — CBC
HCT: 49.3 % (ref 39.0–52.0)
Hemoglobin: 17.3 g/dL — ABNORMAL HIGH (ref 13.0–17.0)
MCH: 30.1 pg (ref 26.0–34.0)
MCV: 85.9 fL (ref 78.0–100.0)
Platelets: 228 10*3/uL (ref 150–400)
RBC: 5.74 MIL/uL (ref 4.22–5.81)
WBC: 8.2 10*3/uL (ref 4.0–10.5)

## 2011-08-26 LAB — DIFFERENTIAL
Eosinophils Absolute: 0.4 10*3/uL (ref 0.0–0.7)
Eosinophils Relative: 5 % (ref 0–5)
Lymphocytes Relative: 14 % (ref 12–46)
Lymphs Abs: 1.1 10*3/uL (ref 0.7–4.0)
Monocytes Absolute: 0.8 10*3/uL (ref 0.1–1.0)
Monocytes Relative: 9 % (ref 3–12)

## 2011-08-26 LAB — PROTIME-INR: INR: 0.98 (ref 0.00–1.49)

## 2011-08-26 MED ORDER — PANTOPRAZOLE SODIUM 40 MG IV SOLR
40.0000 mg | Freq: Once | INTRAVENOUS | Status: AC
  Administered 2011-08-26: 40 mg via INTRAVENOUS
  Filled 2011-08-26: qty 40

## 2011-08-26 MED ORDER — HYDROMORPHONE HCL PF 1 MG/ML IJ SOLN
1.0000 mg | Freq: Once | INTRAMUSCULAR | Status: AC
  Administered 2011-08-26: 1 mg via INTRAVENOUS
  Filled 2011-08-26: qty 1

## 2011-08-26 MED ORDER — SODIUM CHLORIDE 0.9 % IV SOLN
Freq: Once | INTRAVENOUS | Status: AC
  Administered 2011-08-26: 10 mL/h via INTRAVENOUS

## 2011-08-26 MED ORDER — OXYCODONE-ACETAMINOPHEN 5-325 MG PO TABS: 1.0000 | ORAL_TABLET | Freq: Four times a day (QID) | ORAL | Status: AC | PRN

## 2011-08-26 MED ORDER — SUCRALFATE 1 GM/10ML PO SUSP: 1.0000 g | Freq: Four times a day (QID) | ORAL | Status: DC

## 2011-08-26 MED ORDER — ONDANSETRON 8 MG PO TBDP: 8.0000 mg | ORAL_TABLET | Freq: Three times a day (TID) | ORAL | Status: AC | PRN

## 2011-08-26 MED ORDER — PANTOPRAZOLE SODIUM 20 MG PO TBEC: 40.0000 mg | DELAYED_RELEASE_TABLET | Freq: Every day | ORAL | Status: DC

## 2011-08-26 MED ORDER — FAMOTIDINE 20 MG PO TABS: 20.0000 mg | ORAL_TABLET | Freq: Two times a day (BID) | ORAL | Status: DC

## 2011-08-26 MED ORDER — GI COCKTAIL ~~LOC~~
30.0000 mL | Freq: Once | ORAL | Status: AC
  Administered 2011-08-26: 30 mL via ORAL
  Filled 2011-08-26: qty 30

## 2011-08-26 MED ORDER — FAMOTIDINE IN NACL 20-0.9 MG/50ML-% IV SOLN
20.0000 mg | Freq: Once | INTRAVENOUS | Status: AC
  Administered 2011-08-26: 20 mg via INTRAVENOUS
  Filled 2011-08-26: qty 50

## 2011-08-26 NOTE — Telephone Encounter (Signed)
i agree, thanks 

## 2011-08-26 NOTE — Discharge Instructions (Signed)
Hematemesis  This condition is the vomiting of blood.  CAUSES   This can happen if you have a peptic ulcer or an irritation of the throat, stomach, or small bowel. Vomiting over and over again or swallowing blood from a nosebleed, coughing or facial injury can also result in bloody vomit. Anti-inflammatory pain medicines are a common cause of this potentially dangerous condition. The most serious causes of vomiting blood include:   Ulcers (a bacteria called H. pylori is common cause of ulcers).   Clotting problems.   Alcoholism.   Cirrhosis.  TREATMENT   Treatment depends on the cause and the severity of the bleeding. Small amounts of blood streaks in the vomit is not the same as vomiting large amounts of bloody or dark, coffee grounds-like material. Weakness, fainting, dehydration, anemia, and continued alcohol or drug use increase the risk. Examination may include blood, vomit, or stool tests. The presence of bloody or dark stool that tests positive for blood (Hemoccult) means the bleeding has been going on for some time. Endoscopy and imaging studies may be done. Emergency treatment may include:   IV medicines or fluids.   Blood transfusions.   Surgery.  Hospital care is required for high risk patients or when IV fluids or blood is needed. Upper GI bleeding can cause shock and death if not controlled.  HOME CARE INSTRUCTIONS    Your treatment does not require hospital care at this time.   Remain at rest until your condition improves.   Drink clear liquids as tolerated.   Avoid:   Alcohol.   Nicotine.   Aspirin.   Any other anti-inflammatory medicine (ibuprofen, naproxen, and many others).   Medications to suppress stomach acid or vomiting may be needed. Take all your medicine as prescribed.   Be sure to see your caregiver for follow-up as recommended.  SEEK IMMEDIATE MEDICAL CARE IF:    You have repeated vomiting, dehydration, fainting, or extreme weakness.   You are vomiting large amounts of  bloody or dark material.   You pass large, dark or bloody stools.  Document Released: 04/07/2004 Document Revised: 02/17/2011 Document Reviewed: 04/23/2008  [employer] Patient Information 2012 [employer].

## 2011-08-26 NOTE — Telephone Encounter (Signed)
FYI: Pt has vomited blood twice and is very weak.  He was advised to go to the ER.  His girlfriend will drive him now.

## 2011-08-26 NOTE — ED Provider Notes (Signed)
History     CSN: [id number]  Arrival date & time 08/26/11  1103   First MD Initiated Contact with Patient 08/26/11 1203      Chief Complaint  Patient presents with  . Hematemesis    (Consider location/radiation/quality/duration/timing/severity/associated sxs/prior treatment) HPI Comments: Recent admission with EGD where duodenitis and mallory weiss tear was identified.  Placed on protonix.  Has scheduled colonoscopy in 1 week  Patient is a 41 y.o. male presenting with abdominal pain. The history is provided by the patient. No language interpreter was used.  Abdominal Pain The primary symptoms of the illness include abdominal pain, nausea, vomiting, hematemesis and hematochezia. The primary symptoms of the illness do not include fever, fatigue, shortness of breath, diarrhea or dysuria. The current episode started 6 to 12 hours ago. The onset of the illness was gradual. The problem has been gradually worsening.  The abdominal pain began 6 to 12 hours ago. The pain came on gradually. The abdominal pain has been gradually worsening since its onset. The abdominal pain is located in the epigastric region. The abdominal pain does not radiate. The abdominal pain is relieved by nothing. The abdominal pain is exacerbated by vomiting.  The vomiting began today. Vomiting occurs 2 to 5 times per day. The emesis contains stomach contents and bright red blood.  The hematemesis is a recurrent problem. The hematemesis has occurred 2 times. The hematemesis is associated with NSAID use. The hematemesis is associated with heartburn. The hematemesis is not associated with weakness or dizziness.  The hematochezia began more than 1 week ago. The hematochezia has occurred 1 time per day.  Additional symptoms associated with the illness include heartburn. Symptoms associated with the illness do not include chills, constipation, urgency, frequency or back pain.    Past Medical History  Diagnosis Date  . Headache    . MVP (mitral valve prolapse)   . Lateral meniscus tear     Right  . Rectal bleeding   . Mallory - Weiss tear   . Migraine with aura     Past Surgical History  Procedure Date  . Elbow surgery 1987    Right  . Appendectomy 1990's  . Knee arthroscopy     Right  . Shoulder surgery 2012    right rotator cuff  . Esophagogastroduodenoscopy 08/18/2011    Procedure: ESOPHAGOGASTRODUODENOSCOPY (EGD);  Surgeon: Beverley Fiedler, MD;  Location: [facility];  Service: Gastroenterology;  Laterality: N/A;    Family History  Problem Relation Age of Onset  . Hypertension Mother   . Stroke Mother   . Migraines Mother   . Hypertension Father   . Stroke Father   . Heart failure Father   . Asthma Father   . Colon cancer Neg Hx   . Prostate cancer Neg Hx   . Diabetes Mother   . Colon polyps Father     History  Substance Use Topics  . Smoking status: Never Smoker   . Smokeless tobacco: Never Used  . Alcohol Use: No      Review of Systems  Constitutional: Negative for fever, chills, activity change, appetite change and fatigue.  HENT: Negative for congestion, sore throat, rhinorrhea, neck pain and neck stiffness.   Respiratory: Negative for cough, chest tightness and shortness of breath.   Cardiovascular: Negative for chest pain and palpitations.  Gastrointestinal: Positive for heartburn, nausea, vomiting, abdominal pain, blood in stool, hematochezia and hematemesis. Negative for diarrhea and constipation.  Genitourinary: Negative for dysuria, urgency, frequency and flank  pain.  Musculoskeletal: Negative for myalgias, back pain and arthralgias.  Neurological: Negative for dizziness, weakness, light-headedness, numbness and headaches.  All other systems reviewed and are negative.    Allergies  Codeine; Hydrocodone-acetaminophen; and Compazine  Home Medications   Current Outpatient Rx  Name Route Sig Dispense Refill  . PANTOPRAZOLE SODIUM 40 MG PO TBEC Oral Take 1 tablet (40 mg  total) by mouth daily. 30 tablet 1  . HYDROCORTISONE ACE-PRAMOXINE 1-1 % RE CREA Rectal Place rectally 3 (three) times daily. 30 g 3  . TRAMADOL HCL 50 MG PO TABS Oral Take 1 tablet (50 mg total) by mouth every 6 (six) hours as needed for pain. 20 tablet 0  . FAMOTIDINE 20 MG PO TABS Oral Take 1 tablet (20 mg total) by mouth 2 (two) times daily. 60 tablet 0  . ONDANSETRON 8 MG PO TBDP Oral Take 1 tablet (8 mg total) by mouth every 8 (eight) hours as needed for nausea. 20 tablet 0  . OXYCODONE-ACETAMINOPHEN 5-325 MG PO TABS Oral Take 1-2 tablets by mouth every 6 (six) hours as needed for pain. 20 tablet 0  . PANTOPRAZOLE SODIUM 20 MG PO TBEC Oral Take 2 tablets (40 mg total) by mouth daily. 60 tablet 0  . SUCRALFATE 1 GM/10ML PO SUSP Oral Take 10 mLs (1 g total) by mouth 4 (four) times daily. 420 mL 0    BP 120/75  Pulse 55  Temp 97.9 F (36.6 C) (Oral)  Resp 9  Ht 5\' 7"  (1.702 m)  Wt 140 lb (63.504 kg)  BMI 21.93 kg/m2  SpO2 97%  Physical Exam  Nursing note and vitals reviewed. Constitutional: He is oriented to person, place, and time. He appears well-developed and well-nourished.  HENT:  Head: Normocephalic and atraumatic.  Mouth/Throat: Oropharynx is clear and moist. No oropharyngeal exudate.  Eyes: Conjunctivae and EOM are normal. Pupils are equal, round, and reactive to light.  Neck: Normal range of motion. Neck supple.  Cardiovascular: Normal rate, regular rhythm, normal heart sounds and intact distal pulses.  Exam reveals no gallop and no friction rub.   No murmur heard. Pulmonary/Chest: Effort normal and breath sounds normal. No respiratory distress. He exhibits no tenderness.  Abdominal: Soft. Bowel sounds are normal. There is tenderness (epigastric pain on palpation - mod to severe). There is no rebound and no guarding.  Genitourinary: Guaiac positive stool.       No gross blood identified.  Hemorrhoids - external  Musculoskeletal: Normal range of motion. He exhibits no  edema and no tenderness.  Neurological: He is alert and oriented to person, place, and time. No cranial nerve deficit.  Skin: Skin is warm and dry. No rash noted.    ED Course  Procedures (including critical care time)  Labs Reviewed  CBC - Abnormal; Notable for the following:    Hemoglobin 17.3 (*)     All other components within normal limits  BASIC METABOLIC PANEL - Abnormal; Notable for the following:    GFR calc non Af Amer 89 (*)     All other components within normal limits  DIFFERENTIAL  PROTIME-INR  APTT  LIPASE, BLOOD   Dg Abd Acute W/chest  08/26/2011  *RADIOLOGY REPORT*  Clinical Data: Abdominal pain.  Vomiting blood.  Blood in stool.  ACUTE ABDOMEN SERIES (ABDOMEN 2 VIEW & CHEST 1 VIEW)  Comparison: CT 08/17/2011  Findings: Artifact overlies the chest.  Heart size is normal. Mediastinal shadows are normal.  Lungs are clear.  No free air  under the diaphragm.  Supine and decubitus abdominal radiographs show a normal bowel gas pattern.  No evidence of ileus, obstruction or free air.  There are surgical clips in the right lower quadrant, presumably related to appendectomy.  There is mild curvature of the lower lumbar spine.  IMPRESSION: No acute or significant plain radiographic finding.  Original Report Authenticated By: Thomasenia Sales, M.D.     1. Hematemesis   2. Mallory - Weiss tear   3. Gastritis and duodenitis       MDM  Hematemesis secondary to Mallory-Weiss tear and gastritis and duodenitis. He has no evidence of perforation. Pain improved after administration of Protonix, Pepcid, GI cocktail, Dilaudid. He will be discharged home with antiemetics, pain medication, Carafate, Protonix, Pepcid. Has an appointment with GI next week. There is no indication for additional testing. He had a recent EGD. Hemoglobin is stable.        Dayton Bailiff, MD 08/26/11 1505

## 2011-08-26 NOTE — ED Notes (Signed)
Patient reports that he started vomiting blood at 0400 this AM while at work. Patient was discharged from [facility] a week ago and was diagnosed with an ulcer and a tear in his stomach.  Patient states he vomited x 2 bright red blood with small clots present.

## 2011-08-29 ENCOUNTER — Ambulatory Visit (HOSPITAL_COMMUNITY)
Admission: RE | Admit: 2011-08-29 | Discharge: 2011-08-29 | Disposition: A | Discharge status: Home / Self Care | Payer: BC Managed Care – PPO | Source: Ambulatory Visit | Attending: Gastroenterology | Admitting: Gastroenterology

## 2011-08-29 ENCOUNTER — Encounter (HOSPITAL_COMMUNITY): Payer: Self-pay

## 2011-08-29 NOTE — Anesthesia Preprocedure Evaluation (Signed)
Anesthesia Evaluation  Patient identified by MRN, date of birth, ID band Patient awake    Reviewed: Allergy & Precautions, H&P , NPO status , Patient's Chart, lab work & pertinent test results  Airway Mallampati: II TM Distance: >3 FB Neck ROM: Full    Dental No notable dental hx.    Pulmonary neg pulmonary ROS,  breath sounds clear to auscultation  Pulmonary exam normal       Cardiovascular negative cardio ROS  Rhythm:Regular Rate:Normal     Neuro/Psych negative neurological ROS  negative psych ROS   GI/Hepatic Neg liver ROS, PUD,   Endo/Other  negative endocrine ROS  Renal/GU negative Renal ROS  negative genitourinary   Musculoskeletal negative musculoskeletal ROS (+)   Abdominal   Peds negative pediatric ROS (+)  Hematology negative hematology ROS (+)   Anesthesia Other Findings   Reproductive/Obstetrics negative OB ROS                           Anesthesia Physical Anesthesia Plan  ASA: II  Anesthesia Plan: MAC   Post-op Pain Management:    Induction: Intravenous  Airway Management Planned: Natural Airway and Simple Face Mask  Additional Equipment:   Intra-op Plan:   Post-operative Plan:   Informed Consent: I have reviewed the patients History and Physical, chart, labs and discussed the procedure including the risks, benefits and alternatives for the proposed anesthesia with the patient or authorized representative who has indicated his/her understanding and acceptance.     Plan Discussed with: CRNA  Anesthesia Plan Comments:         Anesthesia Quick Evaluation

## 2011-08-29 NOTE — Pre-Procedure Instructions (Signed)
Instructed patient to be here at 0630 09/01/2011, may take his Protonix, Carafate, Pepcid with a sip of water .

## 2011-09-01 ENCOUNTER — Encounter (HOSPITAL_COMMUNITY): Payer: Self-pay | Admitting: Anesthesiology

## 2011-09-01 ENCOUNTER — Encounter (HOSPITAL_COMMUNITY): Payer: Self-pay | Admitting: *Deleted

## 2011-09-01 ENCOUNTER — Ambulatory Visit (HOSPITAL_COMMUNITY): Payer: BC Managed Care – PPO | Admitting: Anesthesiology

## 2011-09-01 ENCOUNTER — Encounter (HOSPITAL_COMMUNITY): Payer: Self-pay | Admitting: Gastroenterology

## 2011-09-01 ENCOUNTER — Encounter (HOSPITAL_COMMUNITY)
Admission: RE | Disposition: A | Discharge status: Home / Self Care | Payer: Self-pay | Source: Ambulatory Visit | Attending: Gastroenterology

## 2011-09-01 ENCOUNTER — Ambulatory Visit (HOSPITAL_COMMUNITY)
Admission: RE | Admit: 2011-09-01 | Discharge: 2011-09-01 | Disposition: A | Discharge status: Home / Self Care | Payer: BC Managed Care – PPO | Source: Ambulatory Visit | Attending: Gastroenterology | Admitting: Gastroenterology

## 2011-09-01 DIAGNOSIS — K625 Hemorrhage of anus and rectum: Secondary | ICD-10-CM | POA: Insufficient documentation

## 2011-09-01 DIAGNOSIS — K921 Melena: Secondary | ICD-10-CM

## 2011-09-01 DIAGNOSIS — R109 Unspecified abdominal pain: Secondary | ICD-10-CM | POA: Insufficient documentation

## 2011-09-01 DIAGNOSIS — K649 Unspecified hemorrhoids: Secondary | ICD-10-CM

## 2011-09-01 DIAGNOSIS — K644 Residual hemorrhoidal skin tags: Secondary | ICD-10-CM | POA: Insufficient documentation

## 2011-09-01 SURGERY — COLONOSCOPY WITH PROPOFOL
Anesthesia: Monitor Anesthesia Care

## 2011-09-01 MED ORDER — LACTATED RINGERS IV SOLN
INTRAVENOUS | Status: DC
  Administered 2011-09-01: 1000 mL via INTRAVENOUS

## 2011-09-01 MED ORDER — KETAMINE HCL 10 MG/ML IJ SOLN
INTRAMUSCULAR | Status: DC | PRN
  Administered 2011-09-01: 40 mg via INTRAVENOUS

## 2011-09-01 MED ORDER — PROPOFOL 10 MG/ML IV EMUL
INTRAVENOUS | Status: DC | PRN
  Administered 2011-09-01: 140 ug/kg/min via INTRAVENOUS

## 2011-09-01 MED ORDER — MIDAZOLAM HCL 5 MG/5ML IJ SOLN
INTRAMUSCULAR | Status: DC | PRN
  Administered 2011-09-01 (×2): 1 mg via INTRAVENOUS

## 2011-09-01 MED ORDER — FENTANYL CITRATE 0.05 MG/ML IJ SOLN
INTRAMUSCULAR | Status: DC | PRN
  Administered 2011-09-01 (×2): 50 ug via INTRAVENOUS

## 2011-09-01 SURGICAL SUPPLY — 21 items

## 2011-09-01 NOTE — Transfer of Care (Signed)
Immediate Anesthesia Transfer of Care Note  Patient: Bobby Mcbride  Procedure(s) Performed: Procedure(s) (LRB): COLONOSCOPY WITH PROPOFOL (N/A)  Patient Location: PACU  Anesthesia Type: MAC  Level of Consciousness: sedated  Airway & Oxygen Therapy: Patient Spontanous Breathing and Patient connected to face mask oxygen  Post-op Assessment: Report given to PACU RN  Post vital signs: Reviewed and stable  Complications: No apparent anesthesia complications

## 2011-09-01 NOTE — Op Note (Signed)
[facility] [address] [place], [place]  [address]  COLONOSCOPY PROCEDURE REPORT  PATIENT:  Bobby Mcbride, Bobby Mcbride  MR#:  [id number] BIRTHDATE:  1970/04/05, 41 yrs. old  GENDER:  male ENDOSCOPIST:  Rachael Fee, MD REFERRING  Crawford Givens, MD PROCEDURE DATE:  09/01/2011 PROCEDURE:  Colonoscopy 19147 ASA CLASS:  Class II INDICATIONS:  intermittent abd pain, intermittent rectal bleeding  MEDICATIONS:   MAC sedation, administered by CRNA  DESCRIPTION OF PROCEDURE:   After the risks benefits and alternatives of the procedure were thoroughly explained, informed consent was obtained.  Digital rectal exam was performed and revealed no rectal masses.   The Pentax Colonoscope [id number] and [id number]) endoscope was introduced through the anus and advanced to the cecum, which was identified by both the appendix and ileocecal valve, without limitations.  The quality of the prep was good..  The instrument was then slowly withdrawn as the colon was fully examined. <<PROCEDUREIMAGES>>  FINDINGS:  External Hemorrhoids were found.  This was otherwise a normal examination of the colon (see image001 and image002). Retroflexed views in the rectum revealed no abnormalities. COMPLICATIONS:  None  ENDOSCOPIC IMPRESSION: 1) External hemorrhoids 2) Otherwise normal examination  RECOMMENDATIONS: Observe clinically.  Try fiber supplement (citrucel orange powder) once daily.  REPEAT EXAM:  10 years for routine screening  ______________________________ Rachael Fee, MD  n. eSIGNED:   Rachael Fee at 09/01/2011 07:43 AM  Darcus Pester, [phone]

## 2011-09-01 NOTE — Discharge Instructions (Addendum)
YOU HAD AN ENDOSCOPIC PROCEDURE TODAY: Refer to the procedure report that was given to you for any specific questions about what was found during the examination.  If the procedure report does not answer your questions, please call your gastroenterologist to clarify.  YOU SHOULD EXPECT: Some feelings of bloating in the abdomen. Passage of more gas than usual.  Walking can help get rid of the air that was put into your GI tract during the procedure and reduce the bloating. If you had a lower endoscopy (such as a colonoscopy or flexible sigmoidoscopy) you may notice spotting of blood in your stool or on the toilet paper.   DIET: Your first meal following the procedure should be a light meal and then it is ok to progress to your normal diet.  A half-sandwich or bowl of soup is an example of a good first meal.  Heavy or fried foods are harder to digest and may make you feel nasueas or bloated.  Drink plenty of fluids but you should avoid alcoholic beverages for 24 hours.  ACTIVITY: Your care partner should take you home directly after the procedure.  You should plan to take it easy, moving slowly for the rest of the day.  You can resume normal activity the day after the procedure however you should NOT DRIVE or use heavy machinery for 24 hours (because of the sedation medicines used during the test).    SYMPTOMS TO REPORT IMMEDIATELY  A gastroenterologist can be reached at any hour.  Please call your doctor's office for any of the following symptoms:   Following lower endoscopy (colonoscopy, flexible sigmoidoscopy)  Excessive amounts of blood in the stool  Significant tenderness, worsening of abdominal pains  Swelling of the abdomen that is new, acute  Fever of 100 or higher  Following upper endoscopy (EGD, EUS, ERCP)  Vomiting of blood or coffee ground material  New, significant abdominal pain  New, significant chest pain or pain under the shoulder blades  Painful or persistently difficult  swallowing  New shortness of breath  Black, tarry-looking stools  FOLLOW UP: If any biopsies were taken you will be contacted by phone or by letter within the next 1-3 weeks.  Call your gastroenterologist if you have not heard about the biopsies in 3 weeks.  Please also call your gastroenterologist's office with any specific questions about appointments or follow up tests. Colonoscopy Care After Read the instructions outlined below and refer to this sheet in the next few weeks. These discharge instructions provide you with general information on caring for yourself after you leave the hospital. Your doctor may also give you specific instructions. While your treatment has been planned according to the most current medical practices available, unavoidable complications occasionally occur. If you have any problems or questions after discharge, call your doctor. HOME CARE INSTRUCTIONS ACTIVITY:  You may resume your regular activity, but move at a slower pace for the next 24 hours.   Take frequent rest periods for the next 24 hours.   Walking will help get rid of the air and reduce the bloated feeling in your belly (abdomen).   No driving for 24 hours (because of the medicine (anesthesia) used during the test).   You may shower.   Do not sign any important legal documents or operate any machinery for 24 hours (because of the anesthesia used during the test).  NUTRITION:  Drink plenty of fluids.   You may resume your normal diet as instructed by your doctor.  Begin with a light meal and progress to your normal diet. Heavy or fried foods are harder to digest and may make you feel sick to your stomach (nauseated).   Avoid alcoholic beverages for 24 hours or as instructed.  MEDICATIONS:  You may resume your normal medications unless your doctor tells you otherwise.  WHAT TO EXPECT TODAY:  Some feelings of bloating in the abdomen.   Passage of more gas than usual.   Spotting of blood  in your stool or on the toilet paper.  IF YOU HAD POLYPS REMOVED DURING THE COLONOSCOPY:  No aspirin products for 7 days or as instructed.   No alcohol for 7 days or as instructed.   Eat a soft diet for the next 24 hours.  FINDING OUT THE RESULTS OF YOUR TEST Not all test results are available during your visit. If your test results are not back during the visit, make an appointment with your caregiver to find out the results. Do not assume everything is normal if you have not heard from your caregiver or the medical facility. It is important for you to follow up on all of your test results.  SEEK IMMEDIATE MEDICAL CARE IF:  You have more than a spotting of blood in your stool.   Your belly is swollen (abdominal distention).   You are nauseated or vomiting.   You have a fever.   You have abdominal pain or discomfort that is severe or gets worse throughout the day.  Document Released: 10/13/2003 Document Revised: 02/17/2011 Document Reviewed: 10/11/2007 [facility] Patient Information 2012 [place], [place].

## 2011-09-01 NOTE — Anesthesia Postprocedure Evaluation (Signed)
  Anesthesia Post-op Note  Patient: Bobby Mcbride  Procedure(s) Performed: Procedure(s) (LRB): COLONOSCOPY WITH PROPOFOL (N/A)  Patient Location: PACU  Anesthesia Type: MAC  Level of Consciousness: awake and alert   Airway and Oxygen Therapy: Patient Spontanous Breathing  Post-op Pain: mild  Post-op Assessment: Post-op Vital signs reviewed, Patient's Cardiovascular Status Stable, Respiratory Function Stable, Patent Airway and No signs of Nausea or vomiting  Post-op Vital Signs: stable  Complications: No apparent anesthesia complications

## 2011-09-01 NOTE — Interval H&P Note (Signed)
History and Physical Interval Note:  09/01/2011 7:14 AM  Bobby Mcbride  has presented today for surgery, with the diagnosis of Rectal pain [569.42] Rectal bleeding [569.3]  The various methods of treatment have been discussed with the patient and family. After consideration of risks, benefits and other options for treatment, the patient has consented to  Procedure(s) (LRB): COLONOSCOPY WITH PROPOFOL (N/A) as a surgical intervention .  The patient's history has been reviewed, patient examined, no change in status, stable for surgery.  I have reviewed the patients' chart and labs.  Questions were answered to the patient's satisfaction.     Rob Bunting

## 2011-09-01 NOTE — Addendum Note (Signed)
Addendum  created 09/01/11 1026 by Florene Route, CRNA   Modules edited:Anesthesia Medication Administration

## 2011-09-01 NOTE — Preoperative (Signed)
Beta Blockers   Reason not to administer Beta Blockers:Not Applicable 

## 2011-09-01 NOTE — H&P (View-Only) (Signed)
HPI: This is a   very pleasant 41 year old man whom I am meeting for the first time today. He is here with his wife today.  For a bout 6 weeks, rectal bleeding.  5 out of 7 days.  Also pain at anus.  No constipation.  Has lost 10 pounds in 2 month.  He is eating well.  Pants fitting looser.  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 He has had severe headaches for about a month, throbbing in nature. He has been taking 2-3 Advil a day +3 deep hours a day for about a month. He is pretty certain that the headaches started after his anal discomfort and bleeding. He went to the emergency room for the headaches and a CT scan of his head was ordered however he had a anxiety reaction to Compazine and could not undergo the test..    Review of systems: Pertinent positive and negative review of systems were noted in the above HPI section. Complete review of systems was performed and was otherwise normal.    Past Medical History  Diagnosis Date  . Headache   . MVP (mitral valve prolapse)   . Lateral meniscus tear     Right    Past Surgical History  Procedure Date  . Elbow surgery 1987    Right  . Appendectomy 1990's  . Knee arthroscopy     Right  . Shoulder surgery 2012    right rotator cuff    Current Outpatient Prescriptions  Medication Sig Dispense Refill  . ibuprofen (ADVIL,MOTRIN) 200 MG tablet Take 200 mg by mouth every 6 (six) hours as needed. Take 3 tablets every six hours as needed for pain        Allergies as of 08/12/2011 - Review Complete 08/12/2011  Allergen Reaction Noted  . Codeine    . Hydrocodone-acetaminophen    . Compazine (prochlorperazine edisylate) Anxiety 08/02/2011    Family History  Problem Relation Age of Onset  . Hypertension Mother   . Stroke Mother   . Migraines Mother   . Hypertension Father   . Stroke Father   . Heart failure Father   . Asthma Father   . Colon cancer Neg Hx   . Prostate cancer Neg Hx   . Diabetes Mother   .  Colon polyps Father     History   Social History  . Marital Status: Married    Spouse Name: N/A    Number of Children: 1  . Years of Education: N/A   Occupational History  . Firefighter, since 2002   .     Social History Main Topics  . Smoking status: Never Smoker   . Smokeless tobacco: Never Used  . Alcohol Use: No  . Drug Use: No  . Sexually Active: Not on file   Other Topics Concern  . Not on file   Social History Narrative  . No narrative on file       Physical Exam: BP 108/70  Pulse 64  Ht 5\' 7"  (1.702 m)  Wt 142 lb (64.411 kg)  BMI 22.24 kg/m2 Constitutional: generally well-appearing Psychiatric: alert and oriented x3 Eyes: extraocular movements intact Mouth: oral pharynx moist, no lesions Neck: supple no lymphadenopathy Cardiovascular: heart regular rate and rhythm Lungs: clear to auscultation bilaterally Abdomen: soft, nontender, nondistended, no obvious ascites, no peritoneal signs, normal bowel sounds Extremities: no lower extremity edema bilaterally Skin: no lesions on visible extremities Rectal examination : Anus very tender, no  sign of abscess, they'll muscles very tight, appears almost strictured, there are at least one hemorrhoid externally, appears to have a small midline anterior fissure as well. I could not insert a finger due to significant pain.    Assessment and plan: 41 y.o. male with  anal pain, hemorrhoid, fissure, rectal bleeding, weight loss, severe headaches  not clear if his headaches are related to his GI process but they're concerning to me. They are very different for him. He was going to have a CT scan of his head in the emergency room but that did not happen do to a medicine reaction and so I re\re ordering it now. Suspect he might have Crohn's disease with anal rectal fissuring. What argues against that is that his bowels have been completely normal. Hopefully this is just benign, fissuring disease of the anus. He is going to  start sitz baths, topical ointments, fiber supplements. Going to proceed with a colonoscopy in about 2-3 weeks time to allow the anal symptoms some time to heal, clear up. He is also get some basic labs including CBC, complete metabolic profile and sedimentation rate. I will forward this to his primary care physician to alert them of his severe headaches, my plan to scan his head.

## 2011-09-02 ENCOUNTER — Telehealth: Payer: Self-pay

## 2011-09-02 MED ORDER — LACTATED RINGERS IV SOLN
INTRAVENOUS | Status: DC | PRN
  Administered 2011-09-01: 07:00:00 via INTRAVENOUS

## 2011-09-02 NOTE — Addendum Note (Signed)
Addendum  created 09/02/11 0706 by Florene Route, CRNA   Modules edited:Anesthesia Flowsheet, Anesthesia Medication Administration

## 2011-09-02 NOTE — Telephone Encounter (Signed)
He needs an OV about this.  We need to discuss options.

## 2011-09-02 NOTE — Addendum Note (Signed)
Addendum  created 09/02/11 0706 by Florene Route, CRNA   Modules edited:Anesthesia Flowsheet

## 2011-09-02 NOTE — Telephone Encounter (Signed)
Pt had colonoscopy yesterday;Dr Christella Hartigan suggested pt contact PCP about anxiety med for stress and weight loss.Please advise. [employer].

## 2011-09-02 NOTE — Telephone Encounter (Signed)
Patient advised. Appt scheduled. 

## 2011-09-07 ENCOUNTER — Ambulatory Visit (INDEPENDENT_AMBULATORY_CARE_PROVIDER_SITE_OTHER): Payer: BC Managed Care – PPO | Admitting: Family Medicine

## 2011-09-07 ENCOUNTER — Encounter: Payer: Self-pay | Admitting: Family Medicine

## 2011-09-07 VITALS — BP 130/78 | HR 64 | Temp 98.2°F | Wt 142.0 lb

## 2011-09-07 DIAGNOSIS — F411 Generalized anxiety disorder: Secondary | ICD-10-CM

## 2011-09-07 DIAGNOSIS — F419 Anxiety disorder, unspecified: Secondary | ICD-10-CM

## 2011-09-07 MED ORDER — CITALOPRAM HYDROBROMIDE 20 MG PO TABS: 20.0000 mg | ORAL_TABLET | Freq: Every day | ORAL | Status: DC

## 2011-09-07 NOTE — Patient Instructions (Signed)
Start taking celexa, 20mg  a day.  If you have concerns, notify me.  Otherwise call back with an update in about 3-4 weeks.  Take care.

## 2011-09-07 NOTE — Progress Notes (Signed)
"  Long term worrier."  He is going through a divorce.  Sleep isn't good, tossing and turning.  Fatigued.  Doesn't feel guilty. Looks forward to getting remarried.  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  His girlfriend is a stabilizing influence, "I'm glad I'm with her.  She tells me I don't need to worry."  Has been sick with mallory weiss tear. He doesn't feel depressed.  No SI/HI.    Migraine history, has but back on caffeine.  We talked about this and SSRI prophylaxis.   Meds, vitals, and allergies reviewed.   ROS: See HPI.  Otherwise, noncontributory.  nad ncat rrr ctab abd soft, not ttp Speech wnl Affect wnl

## 2011-09-07 NOTE — Assessment & Plan Note (Signed)
Works at the [employer] and that would affect med choice, ie no BZD.  Start celexa with routine cautions, esp re: libido.  If not tolerated, he'll notify me.  If doing well, he'll call back with update.  He agrees with plan.  Okay for outpatient f/u.  This may help with migraines.

## 2011-09-08 ENCOUNTER — Ambulatory Visit: Payer: BC Managed Care – PPO | Admitting: Family Medicine

## 2011-11-07 ENCOUNTER — Emergency Department (HOSPITAL_COMMUNITY)
Admission: EM | Admit: 2011-11-07 | Discharge: 2011-11-07 | Disposition: A | Discharge status: Home / Self Care | Payer: BC Managed Care – PPO | Attending: Emergency Medicine | Admitting: Emergency Medicine

## 2011-11-07 ENCOUNTER — Encounter (HOSPITAL_COMMUNITY): Payer: Self-pay | Admitting: *Deleted

## 2011-11-07 DIAGNOSIS — K2970 Gastritis, unspecified, without bleeding: Secondary | ICD-10-CM | POA: Insufficient documentation

## 2011-11-07 DIAGNOSIS — R10816 Epigastric abdominal tenderness: Secondary | ICD-10-CM | POA: Insufficient documentation

## 2011-11-07 DIAGNOSIS — F329 Major depressive disorder, single episode, unspecified: Secondary | ICD-10-CM | POA: Insufficient documentation

## 2011-11-07 DIAGNOSIS — F3289 Other specified depressive episodes: Secondary | ICD-10-CM | POA: Insufficient documentation

## 2011-11-07 DIAGNOSIS — K219 Gastro-esophageal reflux disease without esophagitis: Secondary | ICD-10-CM | POA: Insufficient documentation

## 2011-11-07 DIAGNOSIS — Z8711 Personal history of peptic ulcer disease: Secondary | ICD-10-CM | POA: Insufficient documentation

## 2011-11-07 DIAGNOSIS — Z8719 Personal history of other diseases of the digestive system: Secondary | ICD-10-CM | POA: Insufficient documentation

## 2011-11-07 HISTORY — DX: Personal history of other diseases of the digestive system: Z87.19

## 2011-11-07 HISTORY — DX: Gastro-esophageal reflux disease without esophagitis: K21.9

## 2011-11-07 HISTORY — DX: Personal history of peptic ulcer disease: Z87.11

## 2011-11-07 LAB — CBC WITH DIFFERENTIAL/PLATELET
Basophils Absolute: 0 10*3/uL (ref 0.0–0.1)
Basophils Relative: 0 % (ref 0–1)
HCT: 45 % (ref 39.0–52.0)
Lymphocytes Relative: 15 % (ref 12–46)
MCHC: 36.9 g/dL — ABNORMAL HIGH (ref 30.0–36.0)
Neutro Abs: 6.8 10*3/uL (ref 1.7–7.7)
Neutrophils Relative %: 77 % (ref 43–77)
Platelets: 196 10*3/uL (ref 150–400)
RDW: 12.8 % (ref 11.5–15.5)
WBC: 8.9 10*3/uL (ref 4.0–10.5)

## 2011-11-07 LAB — COMPREHENSIVE METABOLIC PANEL
ALT: 11 U/L (ref 0–53)
AST: 16 U/L (ref 0–37)
Albumin: 3.9 g/dL (ref 3.5–5.2)
CO2: 28 mEq/L (ref 19–32)
Chloride: 104 mEq/L (ref 96–112)
Creatinine, Ser: 0.98 mg/dL (ref 0.50–1.35)
GFR calc non Af Amer: >90 mL/min (ref >90)
Potassium: 4 mEq/L (ref 3.5–5.1)
Sodium: 140 mEq/L (ref 135–145)
Total Bilirubin: 0.8 mg/dL (ref 0.3–1.2)

## 2011-11-07 LAB — SAMPLE TO BLOOD BANK

## 2011-11-07 MED ORDER — ONDANSETRON HCL 4 MG/2ML IJ SOLN
INTRAMUSCULAR | Status: AC
  Filled 2011-11-07: qty 4

## 2011-11-07 MED ORDER — SODIUM CHLORIDE 0.9 % IV SOLN
80.0000 mg | Freq: Once | INTRAVENOUS | Status: AC
  Administered 2011-11-07: 80 mg via INTRAVENOUS
  Filled 2011-11-07: qty 80

## 2011-11-07 MED ORDER — MORPHINE SULFATE 4 MG/ML IJ SOLN
8.0000 mg | Freq: Once | INTRAMUSCULAR | Status: AC
  Administered 2011-11-07: 8 mg via INTRAVENOUS
  Filled 2011-11-07: qty 2

## 2011-11-07 MED ORDER — SUCRALFATE 1 G PO TABS
1.0000 g | ORAL_TABLET | Freq: Once | ORAL | Status: DC
  Filled 2011-11-07: qty 1

## 2011-11-07 MED ORDER — PROMETHAZINE HCL 25 MG/ML IJ SOLN
25.0000 mg | Freq: Once | INTRAMUSCULAR | Status: AC
  Administered 2011-11-07: 25 mg via INTRAVENOUS
  Filled 2011-11-07: qty 1

## 2011-11-07 MED ORDER — OXYCODONE-ACETAMINOPHEN 5-325 MG PO TABS
1.0000 | ORAL_TABLET | Freq: Once | ORAL | Status: AC
  Administered 2011-11-07: 1 via ORAL
  Filled 2011-11-07: qty 1

## 2011-11-07 MED ORDER — OXYCODONE-ACETAMINOPHEN 5-325 MG PO TABS: 1.0000 | ORAL_TABLET | ORAL | Status: DC | PRN

## 2011-11-07 MED ORDER — ONDANSETRON HCL 8 MG PO TABS: 8.0000 mg | ORAL_TABLET | Freq: Three times a day (TID) | ORAL | Status: DC | PRN

## 2011-11-07 MED ORDER — SODIUM CHLORIDE 0.9 % IV BOLUS (SEPSIS)
1000.0000 mL | Freq: Once | INTRAVENOUS | Status: AC
  Administered 2011-11-07: 1000 mL via INTRAVENOUS

## 2011-11-07 MED ORDER — GI COCKTAIL ~~LOC~~
30.0000 mL | Freq: Once | ORAL | Status: AC
  Administered 2011-11-07: 30 mL via ORAL
  Filled 2011-11-07: qty 30

## 2011-11-07 NOTE — ED Notes (Signed)
Gave old and new ECG to Dr. Nino Parsley after I performed.9:41am JG.

## 2011-11-07 NOTE — ED Notes (Signed)
Carafate ordered from pharmacy

## 2011-11-07 NOTE — ED Notes (Signed)
C/o abd pain with n/v since 0100. Reports vomitted blood. Per EMS given morphine 4mg  & zofran 4mg  IV given.

## 2011-11-07 NOTE — ED Provider Notes (Signed)
History     CSN: [id number]  Arrival date & time 11/07/11  0930   First MD Initiated Contact with Patient 11/07/11 1011      Chief Complaint  Patient presents with  . Abdominal Pain  . GI Bleeding    (Consider location/radiation/quality/duration/timing/severity/associated sxs/prior treatment) HPI Comments: Bobby Mcbride presents with acute on chronic abdominal pain along with nausea, vomiting and diarrhea which flared up again around 1 am this morning.  He reports having multiple episodes of vomiting which was accompanied by a small amount of bright red blood after about the third episode of vomiting.  He has produced no emesis since seeing the blood, but has been dry heaving since.  Family at bedside reports he has been having lots of increased stress over a personal situation, currently going through a separation.  He denies suicidal or homicidal ideation and reports he is being seen by his pcp for this problem.  He is here to get his nausea and abdominal pain better and is otherwise not forthcoming regarding his current stressors.  He denies fevers, chills and has seen no blood in his stool. His abdominal pain is constant,  crampy with intermittent sharp stabs and does not radiate.    The history is provided by the patient, a parent and a relative.    Past Medical History  Diagnosis Date  . Headache   . MVP (mitral valve prolapse)   . Lateral meniscus tear     Right  . Rectal bleeding   . Mallory - Weiss tear   . Migraine with aura   . GERD (gastroesophageal reflux disease)   . History of stomach ulcers     Past Surgical History  Procedure Date  . Elbow surgery 1987    Right  . Appendectomy 1990's  . Knee arthroscopy     Right  . Shoulder surgery 2012    right rotator cuff  . Esophagogastroduodenoscopy 08/18/2011    Procedure: ESOPHAGOGASTRODUODENOSCOPY (EGD);  Surgeon: Beverley Fiedler, MD;  Location: [facility];  Service: Gastroenterology;  Laterality: N/A;    Family  History  Problem Relation Age of Onset  . Hypertension Mother   . Stroke Mother   . Migraines Mother   . Diabetes Mother   . Hypertension Father   . Stroke Father   . Heart failure Father   . Asthma Father   . Colon polyps Father   . Colon cancer Neg Hx   . Prostate cancer Neg Hx     History  Substance Use Topics  . Smoking status: Never Smoker   . Smokeless tobacco: Never Used  . Alcohol Use: No      Review of Systems  Constitutional: Negative for fever.  HENT: Negative for congestion, sore throat and neck pain.   Eyes: Negative.   Respiratory: Negative for chest tightness and shortness of breath.   Cardiovascular: Negative for chest pain.  Gastrointestinal: Positive for nausea, vomiting, abdominal pain and diarrhea.  Genitourinary: Negative.   Musculoskeletal: Negative for joint swelling and arthralgias.  Skin: Negative.  Negative for rash and wound.  Neurological: Negative for dizziness, weakness, light-headedness, numbness and headaches.  Hematological: Negative.   Psychiatric/Behavioral: Negative for suicidal ideas.    Allergies  Codeine; Hydrocodone-acetaminophen; and Compazine  Home Medications   Current Outpatient Rx  Name Route Sig Dispense Refill  . FAMOTIDINE 20 MG PO TABS Oral Take 1 tablet (20 mg total) by mouth 2 (two) times daily. 60 tablet 0  . KETOROLAC TROMETHAMINE  10 MG PO TABS Oral Take 10 mg by mouth every 6 (six) hours as needed. For pain    . OMEPRAZOLE 20 MG PO CPDR Oral Take 20 mg by mouth 2 (two) times daily.    Marland Kitchen ONDANSETRON 8 MG PO TBDP Oral Take 8 mg by mouth every 8 (eight) hours as needed. nausea    . PANTOPRAZOLE SODIUM 20 MG PO TBEC Oral Take 20 mg by mouth 2 (two) times daily.    Marland Kitchen PANTOPRAZOLE SODIUM 40 MG PO TBEC Oral Take 20 mg by mouth daily.      BP 124/72  Pulse 59  Temp 98 F (36.7 C) (Oral)  Resp 13  Ht 5\' 7"  (1.702 m)  Wt 130 lb (58.968 kg)  BMI 20.36 kg/m2  SpO2 96%  Physical Exam  Nursing note and vitals  reviewed. Constitutional: He appears well-developed and well-nourished.  HENT:  Head: Normocephalic and atraumatic.  Eyes: Conjunctivae are normal.  Neck: Normal range of motion.  Cardiovascular: Normal rate, regular rhythm, normal heart sounds and intact distal pulses.   Pulmonary/Chest: Effort normal and breath sounds normal. He has no wheezes.  Abdominal: Soft. Bowel sounds are normal. He exhibits no mass. There is tenderness in the epigastric area. There is no rebound and no guarding.  Musculoskeletal: Normal range of motion.  Neurological: He is alert.  Skin: Skin is warm and dry.  Psychiatric: His affect is blunt. He exhibits a depressed mood.       Pt will not make eye contact.    ED Course  Procedures (including critical care time)  Labs Reviewed  CBC WITH DIFFERENTIAL - Abnormal; Notable for the following:    MCHC 36.9 (*)     All other components within normal limits  COMPREHENSIVE METABOLIC PANEL - Abnormal; Notable for the following:    Glucose, Bld 100 (*)     All other components within normal limits  LIPASE, BLOOD  SAMPLE TO BLOOD BANK  OCCULT BLOOD, POC DEVICE   No results found.   No diagnosis found.  13:35 - hemoccult negative.  Small external hemorrhoid.  MDM  Gastroenteritis,  Very possibly induced by stress and anxiety.  Pt was prescribed oxycodone and zofran for pain and nausea.  He was encouraged to f/u with his pcp in 1-2 days for a recheck of his sx.  He did tolerate po fluids prior to dc home.  He has a non acute abd at time of dc.    Labs reviewed.  Prior ed visits also reviewed including recent endo/colonoscopy 6/13 revealing small mallory weiss.  Heme negative today,  No emesis, particularly no hematemesis while ed.  Encouraged to return for any worsened sx,  otw fu with pcp.   Date: 11/07/2011  Rate: 60  Rhythm: normal sinus rhythm  QRS Axis: normal  Intervals: normal  ST/T Wave abnormalities: normal  Conduction Disutrbances:none   Narrative Interpretation:   Old EKG Reviewed: unchanged       Burgess Amor, PA 11/08/11 1214

## 2011-11-07 NOTE — ED Notes (Signed)
Pt tolerated crackers and sprite.  Gave pt urinal.

## 2011-11-07 NOTE — ED Notes (Signed)
Provider at bedside

## 2011-11-07 NOTE — ED Notes (Signed)
C/o mid upper abd/epigastric pain x 2 weeks, worsening at 0100. Reports vomited blood x 4, last episode 0500. Had recent endoscopy 1 month by Dr. Christella Hartigan.

## 2011-11-08 NOTE — ED Provider Notes (Signed)
Medical screening examination/treatment/procedure(s) were conducted as a shared visit with non-physician practitioner(s) and myself.  I personally evaluated the patient during the encounter  Cheri Guppy, MD 11/08/11 [phone]

## 2011-11-09 ENCOUNTER — Ambulatory Visit (INDEPENDENT_AMBULATORY_CARE_PROVIDER_SITE_OTHER): Payer: BC Managed Care – PPO | Admitting: Family Medicine

## 2011-11-09 ENCOUNTER — Encounter: Payer: Self-pay | Admitting: Family Medicine

## 2011-11-09 VITALS — BP 112/80 | HR 68 | Temp 98.2°F | Ht 67.0 in | Wt 136.2 lb

## 2011-11-09 DIAGNOSIS — F411 Generalized anxiety disorder: Secondary | ICD-10-CM

## 2011-11-09 DIAGNOSIS — F419 Anxiety disorder, unspecified: Secondary | ICD-10-CM

## 2011-11-09 DIAGNOSIS — R109 Unspecified abdominal pain: Secondary | ICD-10-CM

## 2011-11-09 DIAGNOSIS — K920 Hematemesis: Secondary | ICD-10-CM

## 2011-11-09 MED ORDER — CITALOPRAM HYDROBROMIDE 20 MG PO TABS: 20.0000 mg | ORAL_TABLET | Freq: Every day | ORAL | Status: DC

## 2011-11-09 MED ORDER — SUCRALFATE 1 GM/10ML PO SUSP: 1.0000 g | Freq: Four times a day (QID) | ORAL | Status: DC

## 2011-11-09 NOTE — Patient Instructions (Addendum)
Start back on the citalopram and take sucralfate 4 times a day.  You likely have pain from stomach irritation and a mallory weiss tear and taking the meds should help.   See [name] about your referral before you leave today.

## 2011-11-10 ENCOUNTER — Emergency Department (HOSPITAL_COMMUNITY): Payer: BC Managed Care – PPO

## 2011-11-10 ENCOUNTER — Observation Stay (HOSPITAL_COMMUNITY)
Admission: EM | Admit: 2011-11-10 | Discharge: 2011-11-11 | Disposition: A | Discharge status: Home / Self Care | Payer: BC Managed Care – PPO | Attending: Internal Medicine | Admitting: Internal Medicine

## 2011-11-10 ENCOUNTER — Telehealth: Payer: Self-pay | Admitting: Family Medicine

## 2011-11-10 ENCOUNTER — Telehealth: Payer: Self-pay | Admitting: Gastroenterology

## 2011-11-10 ENCOUNTER — Encounter: Payer: Self-pay | Admitting: Family Medicine

## 2011-11-10 ENCOUNTER — Encounter (HOSPITAL_COMMUNITY): Payer: Self-pay

## 2011-11-10 DIAGNOSIS — Z79899 Other long term (current) drug therapy: Secondary | ICD-10-CM | POA: Insufficient documentation

## 2011-11-10 DIAGNOSIS — R1013 Epigastric pain: Secondary | ICD-10-CM | POA: Insufficient documentation

## 2011-11-10 DIAGNOSIS — R1010 Upper abdominal pain, unspecified: Secondary | ICD-10-CM

## 2011-11-10 DIAGNOSIS — K920 Hematemesis: Secondary | ICD-10-CM | POA: Insufficient documentation

## 2011-11-10 DIAGNOSIS — R109 Unspecified abdominal pain: Secondary | ICD-10-CM

## 2011-11-10 DIAGNOSIS — K319 Disease of stomach and duodenum, unspecified: Principal | ICD-10-CM | POA: Insufficient documentation

## 2011-11-10 DIAGNOSIS — Z8711 Personal history of peptic ulcer disease: Secondary | ICD-10-CM | POA: Insufficient documentation

## 2011-11-10 DIAGNOSIS — F411 Generalized anxiety disorder: Secondary | ICD-10-CM | POA: Insufficient documentation

## 2011-11-10 DIAGNOSIS — F419 Anxiety disorder, unspecified: Secondary | ICD-10-CM

## 2011-11-10 DIAGNOSIS — K219 Gastro-esophageal reflux disease without esophagitis: Secondary | ICD-10-CM | POA: Insufficient documentation

## 2011-11-10 DIAGNOSIS — F430 Acute stress reaction: Secondary | ICD-10-CM | POA: Insufficient documentation

## 2011-11-10 LAB — CBC WITH DIFFERENTIAL/PLATELET
Basophils Relative: 0 % (ref 0–1)
Eosinophils Absolute: 0.2 10*3/uL (ref 0.0–0.7)
Eosinophils Relative: 1 % (ref 0–5)
HCT: 47.6 % (ref 39.0–52.0)
Hemoglobin: 17.3 g/dL — ABNORMAL HIGH (ref 13.0–17.0)
MCH: 30.2 pg (ref 26.0–34.0)
MCHC: 36.3 g/dL — ABNORMAL HIGH (ref 30.0–36.0)
Monocytes Absolute: 0.8 10*3/uL (ref 0.1–1.0)
Monocytes Relative: 7 % (ref 3–12)

## 2011-11-10 LAB — COMPREHENSIVE METABOLIC PANEL
ALT: 11 U/L (ref 0–53)
Albumin: 4.5 g/dL (ref 3.5–5.2)
Alkaline Phosphatase: 66 U/L (ref 39–117)
Chloride: 101 mEq/L (ref 96–112)
GFR calc Af Amer: >90 mL/min (ref >90)
Glucose, Bld: 96 mg/dL (ref 70–99)
Potassium: 3.9 mEq/L (ref 3.5–5.1)
Sodium: 137 mEq/L (ref 135–145)
Total Bilirubin: 0.8 mg/dL (ref 0.3–1.2)
Total Protein: 7.5 g/dL (ref 6.0–8.3)

## 2011-11-10 LAB — RAPID URINE DRUG SCREEN, HOSP PERFORMED
Barbiturates: POSITIVE — AB
Tetrahydrocannabinol: NOT DETECTED

## 2011-11-10 LAB — URINALYSIS, ROUTINE W REFLEX MICROSCOPIC
Bilirubin Urine: NEGATIVE
Glucose, UA: NEGATIVE mg/dL
Ketones, ur: 15 mg/dL — AB
Protein, ur: NEGATIVE mg/dL
pH: 7 (ref 5.0–8.0)

## 2011-11-10 MED ORDER — SODIUM CHLORIDE 0.9 % IV SOLN
1000.0000 mL | Freq: Once | INTRAVENOUS | Status: AC
  Administered 2011-11-10: 1000 mL via INTRAVENOUS

## 2011-11-10 MED ORDER — HYDROMORPHONE HCL PF 1 MG/ML IJ SOLN
1.0000 mg | Freq: Once | INTRAMUSCULAR | Status: AC
  Administered 2011-11-10: 1 mg via INTRAVENOUS
  Filled 2011-11-10: qty 1

## 2011-11-10 MED ORDER — ONDANSETRON HCL 4 MG/2ML IJ SOLN
4.0000 mg | Freq: Once | INTRAMUSCULAR | Status: AC
  Administered 2011-11-10: 4 mg via INTRAVENOUS
  Filled 2011-11-10: qty 2

## 2011-11-10 MED ORDER — HYDROMORPHONE HCL PF 1 MG/ML IJ SOLN
1.0000 mg | Freq: Once | INTRAMUSCULAR | Status: DC
  Administered 2011-11-10: 1 mg via INTRAVENOUS

## 2011-11-10 MED ORDER — ONDANSETRON HCL 4 MG/2ML IJ SOLN
4.0000 mg | Freq: Four times a day (QID) | INTRAMUSCULAR | Status: DC | PRN
  Administered 2011-11-10: 4 mg via INTRAVENOUS
  Filled 2011-11-10: qty 2

## 2011-11-10 MED ORDER — SODIUM CHLORIDE 0.9 % IV SOLN
1000.0000 mL | INTRAVENOUS | Status: DC
  Administered 2011-11-10 – 2011-11-11 (×2): 1000 mL via INTRAVENOUS

## 2011-11-10 MED ORDER — HYDROMORPHONE HCL PF 1 MG/ML IJ SOLN
0.5000 mg | Freq: Once | INTRAMUSCULAR | Status: AC
  Administered 2011-11-10: 0.5 mg via INTRAVENOUS
  Filled 2011-11-10: qty 1

## 2011-11-10 MED ORDER — CITALOPRAM HYDROBROMIDE 20 MG PO TABS
20.0000 mg | ORAL_TABLET | Freq: Every day | ORAL | Status: DC
  Administered 2011-11-10 – 2011-11-11 (×2): 20 mg via ORAL
  Filled 2011-11-10 (×2): qty 1

## 2011-11-10 MED ORDER — DIPHENHYDRAMINE HCL 50 MG/ML IJ SOLN
25.0000 mg | INTRAMUSCULAR | Status: DC | PRN
  Administered 2011-11-10: 25 mg via INTRAVENOUS

## 2011-11-10 MED ORDER — DIPHENHYDRAMINE HCL 50 MG/ML IJ SOLN
INTRAMUSCULAR | Status: AC
  Filled 2011-11-10: qty 1

## 2011-11-10 MED ORDER — ONDANSETRON HCL 4 MG PO TABS: 4.0000 mg | ORAL_TABLET | Freq: Four times a day (QID) | ORAL | Status: DC | PRN

## 2011-11-10 MED ORDER — HYDROMORPHONE HCL PF 1 MG/ML IJ SOLN
1.0000 mg | INTRAMUSCULAR | Status: DC | PRN
  Administered 2011-11-10: 1 mg via INTRAVENOUS
  Filled 2011-11-10 (×2): qty 1

## 2011-11-10 MED ORDER — PANTOPRAZOLE SODIUM 40 MG IV SOLR
40.0000 mg | Freq: Two times a day (BID) | INTRAVENOUS | Status: DC
  Administered 2011-11-10 – 2011-11-11 (×2): 40 mg via INTRAVENOUS
  Filled 2011-11-10 (×4): qty 40

## 2011-11-10 NOTE — Telephone Encounter (Signed)
i agree. 

## 2011-11-10 NOTE — H&P (Signed)
[facility] Attending  I have also seen and assessed the patient and agree with the above note. Nasim Garofano E. Karene Bracken, MD, FACG  

## 2011-11-10 NOTE — Telephone Encounter (Signed)
Left message on machine to call back  

## 2011-11-10 NOTE — ED Notes (Signed)
Patient reports that he has a tear in his stomach and ulcer between esophagus and stomach. Patient was told by Dr. Para March to go to Dr. Gerilyn Pilgrim. Dr. Larae Grooms nurse called paiteint and told him to come to the ED for a scope.

## 2011-11-10 NOTE — H&P (Signed)
Primary Care Physician:  Crawford Givens, MD Primary Gastroenterologist:  Wendall Papa, MD  CHIEF COMPLAINT:  abdominal pain, vomiting blood  HPI: Bobby Mcbride is a 41 y.o. male who saw Dr. Christella Hartigan in our practice in May of this year for rectal bleeding. He underwent a colonoscopy with findings of only hemorrhoids and possibly a small fissure. The following month he was hospitalized with hematemesis and lower abdominal pain. CTscan was unrevealing. EGD that admission was pertinent for duodenitis and mallory-Weiss tear. Patient had been taking NSAIDS.  There was some nodularity at GE junction but biopsies c/w with cardia type tissue.  Since then he went back to ED 08/26/11 with abdominal pain / hematemesis. Hemoglobin was 17. Abdominal films negative, patient treated with carafate, PPI. Patient went back to ED 11/07/11 with pain, nausea, vomiting, hematemesis and diarrhea. He was sent home with diagnosis of gastroenteritis, stress and anxiety. Patient has been closely followed by PCP for anxiety, especially surrounding a romantic relationship. Patient called the office today with vomiting. He was advised to go to ED.  He describes frequent episodes of mid abdominal pain. Pain exactly same as previous times. He has been vomiting bright red blood since Monday. No melena. Describes some self-limited rectal bleeding he attributes to hemorrhoids. BMs are His CMET is normal, lipase normal. WBC 11.7, hemoglobin is 17.3. Two view of abdomen is negative. Denies NSAID use. He is on BID PPI.   Past Medical History  Diagnosis Date  . Headache   . MVP (mitral valve prolapse)   . Lateral meniscus tear     Right  . Rectal bleeding   . Mallory - Weiss tear   . Migraine with aura   . GERD (gastroesophageal reflux disease)   . History of stomach ulcers     Past Surgical History  Procedure Date  . Elbow surgery 1987    Right  . Appendectomy 1990's  . Knee arthroscopy     Right  . Shoulder surgery 2012    right  rotator cuff  . Esophagogastroduodenoscopy 08/18/2011    Procedure: ESOPHAGOGASTRODUODENOSCOPY (EGD);  Surgeon: Beverley Fiedler, MD;  Location: [facility];  Service: Gastroenterology;  Laterality: N/A;    Prior to Admission medications   Medication Sig Start Date End Date Taking? Authorizing Provider  citalopram (CELEXA) 20 MG tablet Take 20 mg by mouth daily.   Yes Historical Provider, MD  ketorolac (TORADOL) 10 MG tablet Take 10 mg by mouth every 6 (six) hours as needed. For pain   Yes Historical Provider, MD  ondansetron (ZOFRAN) 8 MG tablet Take 8 mg by mouth every 8 (eight) hours as needed. Nausea   Yes Historical Provider, MD  oxyCODONE-acetaminophen (PERCOCET/ROXICET) 5-325 MG per tablet Take 1 tablet by mouth every 4 (four) hours as needed. Pain   Yes Historical Provider, MD  pantoprazole (PROTONIX) 20 MG tablet Take 20 mg by mouth 2 (two) times daily.   Yes Historical Provider, MD  sucralfate (CARAFATE) 1 GM/10ML suspension Take 1 g by mouth 4 (four) times daily.   Yes Historical Provider, MD    Current Facility-Administered Medications  Medication Dose Route Frequency Provider Last Rate Last Dose  . 0.9 %  sodium chloride infusion  1,000 mL Intravenous Once Flint Melter, MD 999 mL/hr at 11/10/11 1108 1,000 mL at 11/10/11 1108   Followed by  . 0.9 %  sodium chloride infusion  1,000 mL Intravenous Continuous Flint Melter, MD      . HYDROmorphone (DILAUDID) injection 0.5  mg  0.5 mg Intravenous Once Flint Melter, MD   0.5 mg at 11/10/11 1108  . HYDROmorphone (DILAUDID) injection 1 mg  1 mg Intravenous Once Flint Melter, MD   1 mg at 11/10/11 1311  . HYDROmorphone (DILAUDID) injection 1 mg  1 mg Intravenous Once Flint Melter, MD      . ondansetron [facility]) injection 4 mg  4 mg Intravenous Once Flint Melter, MD   4 mg at 11/10/11 1108  . ondansetron (ZOFRAN) injection 4 mg  4 mg Intravenous Once Flint Melter, MD       Current Outpatient Prescriptions  Medication Sig  Dispense Refill  . citalopram (CELEXA) 20 MG tablet Take 20 mg by mouth daily.      Marland Kitchen ketorolac (TORADOL) 10 MG tablet Take 10 mg by mouth every 6 (six) hours as needed. For pain      . ondansetron (ZOFRAN) 8 MG tablet Take 8 mg by mouth every 8 (eight) hours as needed. Nausea      . oxyCODONE-acetaminophen (PERCOCET/ROXICET) 5-325 MG per tablet Take 1 tablet by mouth every 4 (four) hours as needed. Pain      . pantoprazole (PROTONIX) 20 MG tablet Take 20 mg by mouth 2 (two) times daily.      . sucralfate (CARAFATE) 1 GM/10ML suspension Take 1 g by mouth 4 (four) times daily.        Allergies as of 11/10/2011 - Review Complete 11/10/2011  Allergen Reaction Noted  . Codeine    . Hydrocodone-acetaminophen    . Compazine (prochlorperazine edisylate) Anxiety 08/02/2011    Family History  Problem Relation Age of Onset  . Hypertension Mother   . Stroke Mother   . Migraines Mother   . Diabetes Mother   . Hypertension Father   . Stroke Father   . Heart failure Father   . Asthma Father   . Colon polyps Father   . Colon cancer Neg Hx   . Prostate cancer Neg Hx     History   Social History  . Marital Status: Single    Spouse Name: N/A    Number of Children: 1  . Years of Education: N/A   Occupational History  . Firefighter, since 2002   .     Social History Main Topics  . Smoking status: Never Smoker   . Smokeless tobacco: Never Used  . Alcohol Use: No  . Drug Use: No  . Sexually Active: Yes      Review of Systems: All systems reviewed and negative except where noted in HPI   Physical Exam: Vital signs in last 24 hours: Temp:  [97.6 F (36.4 C)-98.2 F (36.8 C)] 97.6 F (36.4 C) (08/29 0941) Pulse Rate:  [64-69] 64  (08/29 1158) Resp:  [16-18] 16  (08/29 1158) BP: (106-112)/(63-80) 106/63 mmHg (08/29 1158) SpO2:  [97 %-99 %] 97 % (08/29 1158) Weight:  [136 lb (61.689 kg)-136 lb 4 oz (61.803 kg)] 136 lb (61.689 kg) (08/29 0941)   General:   Alert,   well-nourished, white male NAD Head:  Normocephalic and atraumatic. Eyes:  Sclera clear, no icterus.   Conjunctiva pink. Ears:  Normal auditory acuity. Neck:  Supple; no masses or thyromegaly. Lungs:  Clear throughout to auscultation.   No wheezes, crackles, or rhonchi. No acute distress. Heart:  Regular rate and rhythm; no murmurs, clicks, rubs,  or gallops. Abdomen:  Soft, nondistended, mild-moderate mid abdominal tenderness. No masses, hepatosplenomegaly or hernias noted. Normal bowel  sounds Rectal:  NO STOOL IN VAULT.   Msk:  Symmetrical without gross deformities. Normal posture. Pulses:  Normal pulses noted. Extremities:  Without clubbing or edema. Neurologic:  Alert and  oriented x4;  grossly normal neurologically. Skin:  Intact without significant lesions or rashes. Cervical Nodes:  No significant cervical adenopathy. Psych:  Flat affect.   Lab Results:  Basename 11/10/11 1100  WBC 11.7*  HGB 17.3*  HCT 47.6  PLT 213   BMET  Basename 11/10/11 1100  NA 137  K 3.9  CL 101  CO2 25  GLUCOSE 96  BUN 11  CREATININE 0.94  CALCIUM 9.2   LFT  Basename 11/10/11 1100  PROT 7.5  ALBUMIN 4.5  AST 15  ALT 11  ALKPHOS 66  BILITOT 0.8  BILIDIR --  IBILI --    Studies/Results: Dg Abd Acute W/chest  11/10/2011  *RADIOLOGY REPORT*  Clinical Data: Epigastric abdominal pain.  Nausea and vomiting. Hematemesis.  ACUTE ABDOMEN SERIES (ABDOMEN 2 VIEW & CHEST 1 VIEW)  Comparison: Acute abdomen series 08/26/2011.  CT abdomen pelvis 08/17/2011.  Findings: Bowel gas pattern unremarkable without evidence of obstruction or significant ileus.  No evidence of free air or significant air fluid levels on the erect image.  Moderate stool burden.  Surgical clips in the right side of the pelvis from prior appendectomy.  No abnormal calcifications.  Regional skeleton intact.  Cardiomediastinal silhouette unremarkable, unchanged.  Lungs clear. Bronchovascular markings normal.  Pulmonary  vascularity normal.  No pneumothorax.  No pleural effusions.  IMPRESSION: No acute abdominal or pulmonary abnormality.   Original Report Authenticated By: Arnell Sieving, M.D.     Impression / Plan:  1. Recurrent abdominal pain, nausea and vomiting with hematemesis. Hemoglobin normal. BUN normal. No melena, hemodynamically stable. Pain refractory to pain medications. Doubt significant GI bleed, possibly has another mallory-weiss tear. Anxiety / stress may be contributing to his recurrent symptoms. Will admit for observation and probable EGD in am. For now, BID PPI, NPO, anti-emetics, analgesics.   2, Significant history of anxiety and depression. Continue Celexa.     LOS: 0 days   Willette Cluster  11/10/2011, 3:15 PM

## 2011-11-10 NOTE — Assessment & Plan Note (Signed)
Okay for outpatient f/u.  He has to decide how to handle the break up with the ex girlfriend, ie if he will continue to take her calls, etc.  He needs a full trial of SSRI and this was discussed.  He declined counseling.  Restart SSRI. He agrees. >25 min spent with face to face with patient, >50% counseling and/or coordinating care

## 2011-11-10 NOTE — Assessment & Plan Note (Signed)
Refer back to GI, add on carafate, continue PPI and add back pepcid.  He can't take other pain meds due to his work situation.  Anxiety is likely playing a role in his GI sx.

## 2011-11-10 NOTE — ED Notes (Signed)
Attempted to call report again, will call back.

## 2011-11-10 NOTE — ED Notes (Signed)
Patient transported to X-ray 

## 2011-11-10 NOTE — Plan of Care (Signed)
Problem: Phase I Progression Outcomes Goal: Voiding-avoid urinary catheter unless indicated Outcome: Completed/Met Date Met:  11/10/11 Voids qs no difficulties

## 2011-11-10 NOTE — Telephone Encounter (Signed)
Patty from Dr Christella Hartigan office called me back to let you know that she called the patient this am and he was still vomiting and said that his pain was a 10 out of 10 so she sent him over to [facility] ER. She was going to let the PA from  GI who was working there to look out for him and to do a consult on him.

## 2011-11-10 NOTE — ED Notes (Signed)
Attempted to call report, will call back, Arline Asp RN tied up.

## 2011-11-10 NOTE — Progress Notes (Signed)
ER f/u for abd pain.  He didn't take a trial if celexa prev.  Anxiety has continued.  Also with return of abd pain.  He hadn't been fully compliant with meds for GI sx prev.  Known mallory weiss tear.  Seen at ER, labs unremarkable.  Here for f/u.    Recently in stressful relationship with a woman.  Separation from prev relationship is ongoing, divorce pending.    No Si/Hi.   No more vomiting/hematemesis since ER.  Abd pain, epigastric, continues.  No blood in stool.  No fevers.  No dysphagia.   PMH and SH reviewed  ROS: See HPI, otherwise noncontributory.  Meds, vitals, and allergies reviewed.   nad but uncomfortable rrr ctab epigastrum ttp w/o rebound Normal BS Ext w/o edema Affect flat

## 2011-11-10 NOTE — Telephone Encounter (Signed)
Noted  

## 2011-11-10 NOTE — Telephone Encounter (Addendum)
Spoke with pt he has a pain level of 10 out of 10.  He last vomited blood last night.  Feels weak, dizzy.  Pt was advised to go to ER for evaluation.

## 2011-11-11 ENCOUNTER — Encounter (HOSPITAL_COMMUNITY): Admission: EM | Disposition: A | Discharge status: Home / Self Care | Payer: Self-pay | Source: Home / Self Care

## 2011-11-11 ENCOUNTER — Encounter (HOSPITAL_COMMUNITY): Payer: Self-pay | Admitting: *Deleted

## 2011-11-11 ENCOUNTER — Other Ambulatory Visit: Payer: Self-pay | Admitting: Internal Medicine

## 2011-11-11 DIAGNOSIS — K319 Disease of stomach and duodenum, unspecified: Secondary | ICD-10-CM

## 2011-11-11 HISTORY — PX: ESOPHAGOGASTRODUODENOSCOPY: SHX5428

## 2011-11-11 LAB — CBC
Hemoglobin: 14.9 g/dL (ref 13.0–17.0)
Platelets: 190 10*3/uL (ref 150–400)
RBC: 4.94 MIL/uL (ref 4.22–5.81)
WBC: 10 10*3/uL (ref 4.0–10.5)

## 2011-11-11 SURGERY — EGD (ESOPHAGOGASTRODUODENOSCOPY)
Anesthesia: Moderate Sedation

## 2011-11-11 MED ORDER — MIDAZOLAM HCL 10 MG/2ML IJ SOLN
INTRAMUSCULAR | Status: DC | PRN
  Administered 2011-11-11 (×2): 2 mg via INTRAVENOUS

## 2011-11-11 MED ORDER — MIDAZOLAM HCL 10 MG/2ML IJ SOLN
INTRAMUSCULAR | Status: AC
  Filled 2011-11-11: qty 2

## 2011-11-11 MED ORDER — CITALOPRAM HYDROBROMIDE 20 MG PO TABS: 10.0000 mg | ORAL_TABLET | Freq: Two times a day (BID) | ORAL | Status: DC

## 2011-11-11 MED ORDER — KETOROLAC TROMETHAMINE 30 MG/ML IJ SOLN
30.0000 mg | Freq: Once | INTRAMUSCULAR | Status: AC
  Administered 2011-11-11: 30 mg via INTRAVENOUS
  Filled 2011-11-11: qty 1

## 2011-11-11 MED ORDER — BUSPIRONE HCL 10 MG PO TABS: 10.0000 mg | ORAL_TABLET | Freq: Two times a day (BID) | ORAL | Status: DC

## 2011-11-11 MED ORDER — DIPHENHYDRAMINE HCL 50 MG/ML IJ SOLN
INTRAMUSCULAR | Status: DC | PRN
  Administered 2011-11-11: 12.5 mg via INTRAVENOUS

## 2011-11-11 MED ORDER — FENTANYL CITRATE 0.05 MG/ML IJ SOLN
INTRAMUSCULAR | Status: AC
  Filled 2011-11-11: qty 2

## 2011-11-11 MED ORDER — DIPHENHYDRAMINE HCL 50 MG/ML IJ SOLN
INTRAMUSCULAR | Status: AC
  Filled 2011-11-11: qty 1

## 2011-11-11 MED ORDER — FENTANYL CITRATE 0.05 MG/ML IJ SOLN
INTRAMUSCULAR | Status: DC | PRN
  Administered 2011-11-11 (×2): 25 ug via INTRAVENOUS

## 2011-11-11 MED ORDER — SODIUM CHLORIDE 0.9 % IV SOLN
Freq: Once | INTRAVENOUS | Status: AC
  Administered 2011-11-11: 500 mL via INTRAVENOUS

## 2011-11-11 NOTE — Progress Notes (Deleted)
[facility]  See egd and colonoscopy reports for instructions and med changes (no ASA/NSAID x 2 weeks and start pantoprazole 40 mg daily) Anticipate release to home later today.  Iva Boop, MD, [facility] [phone] (pager) 11/11/2011 11:25 AM

## 2011-11-11 NOTE — Progress Notes (Signed)
I called him about the missed rx and that buspirone rx was sent to pharmacy.

## 2011-11-11 NOTE — Op Note (Signed)
[facility] [address] [place], [address]   ENDOSCOPY PROCEDURE REPORT  PATIENT: Bobby Mcbride, Bobby Mcbride  MR#: [id number] BIRTHDATE: 1970/12/19 , 41  yrs. old GENDER: Male ENDOSCOPIST: Iva Boop, MD, [facility] REFERRED BY:  Crawford Givens, M.D. PROCEDURE DATE:  11/11/2011 PROCEDURE:  EGD, diagnostic ASA CLASS:     Class II INDICATIONS:  hematemesis. MEDICATIONS: Benadryl 12.5 mg IV, Versed 4 mg IV, and Fentanyl 50 mcg IV TOPICAL ANESTHETIC: Cetacaine Spray  DESCRIPTION OF PROCEDURE: After the risks benefits and alternatives of the procedure were thoroughly explained, informed consent was obtained.  The Pentax Gastroscope [id number] endoscope was introduced through the mouth and advanced to the second portion of the duodenum. Without limitations.  The instrument was slowly withdrawn as the mucosa was fully examined.      STOMACH: Mild gastropathy was found on the anterior wall of the gastric body.   A small patch of abnormal mucosa was found in the cardia.  The mucosa was edematous.   The stomach otherwise appeared normal.  ESOPHAGUS: The mucosa of the esophagus appeared normal.  DUODENUM: The duodenal mucosa showed no abnormalities in the bulb and second portion of the duodenum.  Retroflexed views revealed as described above..     The scope was then withdrawn from the patient and the procedure completed.  COMPLICATIONS: There were no complications. ENDOSCOPIC IMPRESSION: 1.   Gastropathy was found on the anterior wall of the gastric body - from retching and vomiting 9source of bleeding) 2.   Small abnormal mucosa was found in the cardia; The mucosa was edematous - prior bxs show reflux change 3.   The stomach otherwise appeared normal 4.   The mucosa of the esophagus appeared normal 5.   The duodenal mucosa showed no abnormalities in the bulb and second portion of the duodenum  RECOMMENDATIONS: 1.  Home today 2.  buspirone trial for anxiety (Discussed  with Dr.  Para March) 3.  NSAID's for abdominal wall pain - heating pad/local Tx also 4.  see Dr.  Para March in follow-up, does not need further GI eval 5.   resolve situational stressors if possible    eSigned:  Iva Boop, MD, [facility] 11/11/2011 11:48 AM   [id number] Para March, MD  PATIENT NAME:  [name], [name] MR#: [id number]

## 2011-11-11 NOTE — Discharge Summary (Signed)
[name] MRN: [id number] DOB: May 09, 1970 41 y.o. PCP:  Crawford Givens, MD  Date of Admission: 11/10/2011  9:25 AM Date of Discharge: 11/11/2011 Attending Physician: Iva Boop, MD  Discharge Diagnosis: Active Problems:  Upper abdominal pain  Hematemesis/vomiting blood  Anxiety  Gastropathy  Consultations: Treatment Team:  Iva Boop, MD  Procedures Performed:  Dg Abd Acute W/chest  11/10/2011  *RADIOLOGY REPORT*  Clinical Data: Epigastric abdominal pain.  Nausea and vomiting. Hematemesis.  ACUTE ABDOMEN SERIES (ABDOMEN 2 VIEW & CHEST 1 VIEW)  Comparison: Acute abdomen series 08/26/2011.  CT abdomen pelvis 08/17/2011.  Findings: Bowel gas pattern unremarkable without evidence of obstruction or significant ileus.  No evidence of free air or significant air fluid levels on the erect image.  Moderate stool burden.  Surgical clips in the right side of the pelvis from prior appendectomy.  No abnormal calcifications.  Regional skeleton intact.  Cardiomediastinal silhouette unremarkable, unchanged.  Lungs clear. Bronchovascular markings normal.  Pulmonary vascularity normal.  No pneumothorax.  No pleural effusions.  IMPRESSION: No acute abdominal or pulmonary abnormality.   Original Report Authenticated By: Arnell Sieving, M.D.     GI Procedures:  11/11/2011 EGD showed gastropathy in the cardia of the stomach from retching and vomiting (source of bleeding).  History/Physical Exam:  See Admission H&P  Admission HPI: Bobby Mcbride is a 41 y.o. male who was admitted to [facility] hospital by our service for complaints of upper abdominal pain and vomiting blood.  He was placed on IV PPI BID, pain control medications, and antiemetics.  His CMET was normal, lipase normal. WBC 11.7, hemoglobin is 17.3. Two view of abdomen is negative.  He underwent EGD this morning, 8/30 at which time he was found to have some gastropathy in the cardia of the  stomach related to his retching and vomiting.  Dr.  Leone Payor spoke with his PCP who has been following the patient closely for anxiety, especially surrounding a romantic relationship.  It was determined that the patient should restart his Celexa as an outpatient and take BID PPI.  Needs to resolve his situational issues.  NSAID's and heating pad prn for abdominal wall pain.  After his EGD he was given a dose of IV toradol.  He was placed on a diet and tolerated that well.  Hospital Course by problem list: Active Problems:  Upper abdominal pain  Hematemesis/vomiting blood  Anxiety  Gastropathy   Discharge Vitals:  BP 120/74  Pulse 57  Temp 97.1 F (36.2 C) (Oral)  Resp 17  Ht 5\' 7"  (1.702 m)  Wt 137 lb 12.6 oz (62.5 kg)  BMI 21.58 kg/m2  SpO2 100%  Discharge Labs:  Results for orders placed during the hospital encounter of 11/10/11 (from the past 24 hour(s))  URINALYSIS, ROUTINE W REFLEX MICROSCOPIC     Status: Abnormal   Collection Time   11/10/11  8:22 PM      Component Value Range   Color, Urine YELLOW  YELLOW   APPearance CLEAR  CLEAR   Specific Gravity, Urine 1.020  1.005 - 1.030   pH 7.0  5.0 - 8.0   Glucose, UA NEGATIVE  NEGATIVE mg/dL   Hgb urine dipstick NEGATIVE  NEGATIVE   Bilirubin Urine NEGATIVE  NEGATIVE   Ketones, ur 15 (*) NEGATIVE mg/dL   Protein, ur NEGATIVE  NEGATIVE mg/dL   Urobilinogen, UA 0.2  0.0 - 1.0 mg/dL   Nitrite NEGATIVE  NEGATIVE  Leukocytes, UA NEGATIVE  NEGATIVE  URINE RAPID DRUG SCREEN (HOSP PERFORMED)     Status: Abnormal   Collection Time   11/10/11  8:22 PM      Component Value Range   Opiates NONE DETECTED  NONE DETECTED   Cocaine NONE DETECTED  NONE DETECTED   Benzodiazepines NONE DETECTED  NONE DETECTED   Amphetamines NONE DETECTED  NONE DETECTED   Tetrahydrocannabinol NONE DETECTED  NONE DETECTED   Barbiturates POSITIVE (*) NONE DETECTED  CBC     Status: Normal   Collection Time   11/11/11  3:26 AM      Component Value Range    WBC 10.0  4.0 - 10.5 K/uL   RBC 4.94  4.22 - 5.81 MIL/uL   Hemoglobin 14.9  13.0 - 17.0 g/dL   HCT 78.2  95.6 - 21.3 %   MCV 84.6  78.0 - 100.0 fL   MCH 30.2  26.0 - 34.0 pg   MCHC 35.6  30.0 - 36.0 g/dL   RDW 08.6  57.8 - 46.9 %   Platelets 190  150 - 400 K/uL    Disposition and follow-up:   Bobby Mcbride was discharged from [facility] in stable condition.    Follow-up Appointments:  No further GI work-up.  Please follow-up with PCP.   Discharge Orders    Future Orders Please Complete By Expires   Resume previous diet      Call MD for:  temperature >100.5      Discharge instructions      Comments:   Your abdominal pain may be muscle wall pain from retching and vomiting.  Can use NSAID's prn.  Apply heating pad as well for 15 minutes at a time (15 minutes on and then 15 minutes off to avoid burns).   Activity as tolerated - No restrictions         Discharge Medications: Medication List  As of 11/11/2011  1:55 PM   STOP taking these medications         oxyCODONE-acetaminophen 5-325 MG per tablet      sucralfate 1 GM/10ML suspension         TAKE these medications         citalopram 20 MG tablet   Commonly known as: CELEXA   Take 0.5 tablets (10 mg total) by mouth 2 (two) times daily.      ketorolac 10 MG tablet   Commonly known as: TORADOL   Take 10 mg by mouth every 6 (six) hours as needed. For pain      ondansetron 8 MG tablet   Commonly known as: ZOFRAN   Take 8 mg by mouth every 8 (eight) hours as needed. Nausea      pantoprazole 20 MG tablet   Commonly known as: PROTONIX   Take 20 mg by mouth 2 (two) times daily.            SignedCristi Loron, JESSICA D. 11/11/2011, 1:55 PM    Will also Rx buspirone 10 mg bid.  Iva Boop, MD, [facility] [phone] (pager) 11/11/2011 5:26 PM

## 2011-11-11 NOTE — Progress Notes (Signed)
[facility]  EGD showed some retch gastropathy and mild reflux change in cardia.  Main problem is anxiety and situational stressors with women in his life.  Needs to resolve that.  Should continue Celexa and give it more chance.  Have discussed with PCP - will add buspirone - 10 mg bid start 1/2 tab bid x first week  See EGd report also   Iva Boop, MD, [facility] [phone] (pager) 11/11/2011 11:57 AM

## 2011-11-11 NOTE — Progress Notes (Signed)
Patient discharged to home. DC instructions given using teach back method. No concerns  Voiced. Prescription x 1 given for celexa. . Left unit in wheelchair pushed by nurse tech. Left in good condition.

## 2011-11-15 ENCOUNTER — Encounter (HOSPITAL_COMMUNITY): Payer: Self-pay | Admitting: Internal Medicine

## 2011-12-04 ENCOUNTER — Emergency Department (HOSPITAL_COMMUNITY)
Admission: EM | Admit: 2011-12-04 | Discharge: 2011-12-04 | Disposition: A | Discharge status: Home / Self Care | Payer: BC Managed Care – PPO | Attending: Emergency Medicine | Admitting: Emergency Medicine

## 2011-12-04 ENCOUNTER — Emergency Department (HOSPITAL_COMMUNITY): Payer: BC Managed Care – PPO

## 2011-12-04 ENCOUNTER — Encounter (HOSPITAL_COMMUNITY): Payer: Self-pay | Admitting: Emergency Medicine

## 2011-12-04 DIAGNOSIS — K219 Gastro-esophageal reflux disease without esophagitis: Secondary | ICD-10-CM | POA: Insufficient documentation

## 2011-12-04 DIAGNOSIS — R109 Unspecified abdominal pain: Secondary | ICD-10-CM | POA: Insufficient documentation

## 2011-12-04 DIAGNOSIS — F419 Anxiety disorder, unspecified: Secondary | ICD-10-CM

## 2011-12-04 DIAGNOSIS — Z8719 Personal history of other diseases of the digestive system: Secondary | ICD-10-CM

## 2011-12-04 DIAGNOSIS — I059 Rheumatic mitral valve disease, unspecified: Secondary | ICD-10-CM | POA: Insufficient documentation

## 2011-12-04 DIAGNOSIS — Z83719 Family history of colon polyps, unspecified: Secondary | ICD-10-CM | POA: Insufficient documentation

## 2011-12-04 DIAGNOSIS — Z823 Family history of stroke: Secondary | ICD-10-CM | POA: Insufficient documentation

## 2011-12-04 DIAGNOSIS — F411 Generalized anxiety disorder: Secondary | ICD-10-CM | POA: Insufficient documentation

## 2011-12-04 DIAGNOSIS — Z8249 Family history of ischemic heart disease and other diseases of the circulatory system: Secondary | ICD-10-CM | POA: Insufficient documentation

## 2011-12-04 DIAGNOSIS — Z8371 Family history of colonic polyps: Secondary | ICD-10-CM | POA: Insufficient documentation

## 2011-12-04 DIAGNOSIS — Z8711 Personal history of peptic ulcer disease: Secondary | ICD-10-CM

## 2011-12-04 DIAGNOSIS — Z836 Family history of other diseases of the respiratory system: Secondary | ICD-10-CM | POA: Insufficient documentation

## 2011-12-04 DIAGNOSIS — R079 Chest pain, unspecified: Secondary | ICD-10-CM

## 2011-12-04 LAB — CBC
HCT: 49.3 % (ref 39.0–52.0)
Hemoglobin: 17.4 g/dL — ABNORMAL HIGH (ref 13.0–17.0)
MCH: 30.3 pg (ref 26.0–34.0)
MCHC: 35.3 g/dL (ref 30.0–36.0)
MCV: 85.7 fL (ref 78.0–100.0)
RDW: 13 % (ref 11.5–15.5)

## 2011-12-04 LAB — BASIC METABOLIC PANEL
BUN: 7 mg/dL (ref 6–23)
Creatinine, Ser: 0.99 mg/dL (ref 0.50–1.35)
GFR calc Af Amer: >90 mL/min (ref >90)
GFR calc non Af Amer: >90 mL/min (ref >90)
Glucose, Bld: 84 mg/dL (ref 70–99)

## 2011-12-04 LAB — POCT I-STAT TROPONIN I: Troponin i, poc: 0 ng/mL (ref 0.00–0.08)

## 2011-12-04 MED ORDER — ONDANSETRON HCL 4 MG/2ML IJ SOLN
4.0000 mg | Freq: Once | INTRAMUSCULAR | Status: AC
  Administered 2011-12-04: 4 mg via INTRAVENOUS
  Filled 2011-12-04: qty 2

## 2011-12-04 MED ORDER — MORPHINE SULFATE 4 MG/ML IJ SOLN
4.0000 mg | Freq: Once | INTRAMUSCULAR | Status: AC
  Administered 2011-12-04: 4 mg via INTRAVENOUS
  Filled 2011-12-04: qty 1

## 2011-12-04 NOTE — ED Provider Notes (Signed)
History     CSN: [id number]  Arrival date & time 12/04/11  1017   First MD Initiated Contact with Patient 12/04/11 1132      Chief Complaint  Patient presents with  . Chest Pain    (Consider location/radiation/quality/duration/timing/severity/associated sxs/prior treatment) Patient is a 41 y.o. male presenting with chest pain. The history is provided by the patient, medical records and the spouse.  Chest Pain Primary symptoms include shortness of breath, abdominal pain and nausea. Pertinent negatives for primary symptoms include no fever and no vomiting.     Bobby Mcbride is a 41 y.o. male presents to the emergency department complaining of chest pain.  The onset of the symptoms was  gradual starting 2 days ago.  The patient has associated abdominal pain, shortness of breath, L arm pain.  The symptoms have been  persistent, gradually worsened.  nothing makes the symptoms worse and nothing makes symptoms better.  The patient denies fever, chills, headache, neck pain, vomiting, diarrhea, back pain, syncopal episode, weakness, dizziness.  Pt has abdominal pain as well with vomiting.  He describes his pain as  sharp, constant chest pain that increases with movement, waxing and waning but never resolving.  Hx of PUD dx with EGD.  Pt states he has 4-5 ulcers and a mallory weiss tear diagnosed with those in June and repeat EGD was done in Aug.  Hz of prolapsed mitral valve, but no Hx of CAD, MI or other cardiac disease.  No family Hx of cardiac disease. Chart review reveals: 11/11/2011 EGD showed gastropathy in the cardia of the stomach from retching and vomiting (source of bleeding).  He sees Dr Christella Hartigan in GI for his gastric issues.     Past Medical History  Diagnosis Date  . Headache   . MVP (mitral valve prolapse)   . Lateral meniscus tear     Right  . Rectal bleeding   . Mallory - Weiss tear   . Migraine with aura   . GERD (gastroesophageal reflux disease)   . History of stomach ulcers      Past Surgical History  Procedure Date  . Elbow surgery 1987    Right  . Appendectomy 1990's  . Knee arthroscopy     Right  . Shoulder surgery 2012    right rotator cuff  . Esophagogastroduodenoscopy 08/18/2011    Procedure: ESOPHAGOGASTRODUODENOSCOPY (EGD);  Surgeon: Beverley Fiedler, MD;  Location: [facility];  Service: Gastroenterology;  Laterality: N/A;  . Esophagogastroduodenoscopy 11/11/2011    Procedure: ESOPHAGOGASTRODUODENOSCOPY (EGD);  Surgeon: Iva Boop, MD;  Location: [facility];  Service: Endoscopy;  Laterality: N/A;    Family History  Problem Relation Age of Onset  . Hypertension Mother   . Stroke Mother   . Migraines Mother   . Diabetes Mother   . Hypertension Father   . Stroke Father   . Heart failure Father   . Asthma Father   . Colon polyps Father   . Colon cancer Neg Hx   . Prostate cancer Neg Hx     History  Substance Use Topics  . Smoking status: Never Smoker   . Smokeless tobacco: Never Used  . Alcohol Use: No      Review of Systems  Constitutional: Negative for fever and chills.  HENT: Negative for neck pain and neck stiffness.   Respiratory: Positive for shortness of breath.   Cardiovascular: Positive for chest pain.  Gastrointestinal: Positive for nausea and abdominal pain. Negative for vomiting, diarrhea and  constipation.  Genitourinary: Negative for dysuria, urgency, frequency and hematuria.  Musculoskeletal: Positive for arthralgias (L arm). Negative for back pain and gait problem.  Skin: Negative for rash.  Neurological: Negative for syncope, speech difficulty, light-headedness and headaches.  Hematological: Does not bruise/bleed easily.  Psychiatric/Behavioral: Negative for agitation.  All other systems reviewed and are negative.    Allergies  Codeine; Hydrocodone-acetaminophen; and Compazine  Home Medications   Current Outpatient Rx  Name Route Sig Dispense Refill  . BUSPIRONE HCL 10 MG PO TABS Oral Take 10 mg by  mouth daily. Take 1/2 tablet twice a day for first week then whole tablet    . CITALOPRAM HYDROBROMIDE 20 MG PO TABS Oral Take 0.5 tablets (10 mg total) by mouth 2 (two) times daily. 60 tablet 0    Take 1/2 tab BID for the first week. Then increase ...  . KETOROLAC TROMETHAMINE 10 MG PO TABS Oral Take 10 mg by mouth every 6 (six) hours as needed. For pain    . ONDANSETRON HCL 8 MG PO TABS Oral Take 8 mg by mouth every 8 (eight) hours as needed. Nausea    . PANTOPRAZOLE SODIUM 20 MG PO TBEC Oral Take 20 mg by mouth 2 (two) times daily.      BP 124/66  Pulse 56  Temp 97.9 F (36.6 C) (Oral)  Resp 18  SpO2 100%  Physical Exam  Nursing note and vitals reviewed. Constitutional: He appears well-developed and well-nourished. No distress.  HENT:  Head: Normocephalic and atraumatic.  Mouth/Throat: Oropharynx is clear and moist. No oropharyngeal exudate.  Eyes: Conjunctivae normal are normal. No scleral icterus.  Neck: Normal range of motion. Neck supple.  Cardiovascular: Normal rate, regular rhythm, normal heart sounds and intact distal pulses.  Exam reveals no gallop and no friction rub.   No murmur heard. Pulmonary/Chest: Effort normal and breath sounds normal. No respiratory distress. He has no wheezes. He exhibits tenderness.  Abdominal: Soft. Bowel sounds are normal. He exhibits no mass. There is tenderness (epigastric). There is guarding. There is no rebound.  Musculoskeletal: Normal range of motion. He exhibits no edema.  Lymphadenopathy:    He has no cervical adenopathy.  Neurological: He is alert. He has normal reflexes.       Speech is clear and goal oriented, follows commands Normal strength 5/5 in upper and lower extremities bilaterally including dorsiflexion and plantar flexion, strong and equal grip strength Sensation normal to light and sharp touch Moves extremities without ataxia, coordination intact Normal finger to nose and rapid alternating movements   Skin: Skin is  warm and dry. He is not diaphoretic.  Psychiatric: He has a normal mood and affect.    ED Course  Procedures (including critical care time)  Labs Reviewed  CBC - Abnormal; Notable for the following:    Hemoglobin 17.4 (*)     All other components within normal limits  BASIC METABOLIC PANEL  LIPASE, BLOOD  POCT I-STAT TROPONIN I   Dg Chest 2 View  12/04/2011  *RADIOLOGY REPORT*  Clinical Data: Chest pain  CHEST - 2 VIEW  Comparison: 03/01/2010  Findings: Cardiomediastinal silhouette is stable.  Stable mild thoracic dextroscoliosis mild hyperinflation again noted.  No acute infiltrate or pulmonary edema.  IMPRESSION: No active disease.  No significant change.   Original Report Authenticated By: Natasha Mead, M.D.    Results for orders placed during the hospital encounter of 12/04/11  CBC      Component Value Range   WBC 9.8  4.0 - 10.5 K/uL   RBC 5.75  4.22 - 5.81 MIL/uL   Hemoglobin 17.4 (*) 13.0 - 17.0 g/dL   HCT 14.7  82.9 - 56.2 %   MCV 85.7  78.0 - 100.0 fL   MCH 30.3  26.0 - 34.0 pg   MCHC 35.3  30.0 - 36.0 g/dL   RDW 13.0  86.5 - 78.4 %   Platelets 232  150 - 400 K/uL  BASIC METABOLIC PANEL      Component Value Range   Sodium 136  135 - 145 mEq/L   Potassium 4.3  3.5 - 5.1 mEq/L   Chloride 101  96 - 112 mEq/L   CO2 28  19 - 32 mEq/L   Glucose, Bld 84  70 - 99 mg/dL   BUN 7  6 - 23 mg/dL   Creatinine, Ser 6.96  0.50 - 1.35 mg/dL   Calcium 9.0  8.4 - 29.5 mg/dL   GFR calc non Af Amer >90  >90 mL/min   GFR calc Af Amer >90  >90 mL/min  LIPASE, BLOOD      Component Value Range   Lipase 23  11 - 59 U/L  POCT I-STAT TROPONIN I      Component Value Range   Troponin i, poc 0.00  0.00 - 0.08 ng/mL   Comment 3            Dg Abd Acute W/chest  11/10/2011  *RADIOLOGY REPORT*  Clinical Data: Epigastric abdominal pain.  Nausea and vomiting. Hematemesis.  ACUTE ABDOMEN SERIES (ABDOMEN 2 VIEW & CHEST 1 VIEW)  Comparison: Acute abdomen series 08/26/2011.  CT abdomen pelvis  08/17/2011.  Findings: Bowel gas pattern unremarkable without evidence of obstruction or significant ileus.  No evidence of free air or significant air fluid levels on the erect image.  Moderate stool burden.  Surgical clips in the right side of the pelvis from prior appendectomy.  No abnormal calcifications.  Regional skeleton intact.  Cardiomediastinal silhouette unremarkable, unchanged.  Lungs clear. Bronchovascular markings normal.  Pulmonary vascularity normal.  No pneumothorax.  No pleural effusions.  IMPRESSION: No acute abdominal or pulmonary abnormality.   Original Report Authenticated By: Arnell Sieving, M.D.     ECG:   Date: 12/04/2011  Rate: 62  Rhythm: normal sinus rhythm  QRS Axis: normal  Intervals: normal  ST/T Wave abnormalities: early repolarization  Conduction Disutrbances:none  Narrative Interpretation: NSR with early repol  Old EKG Reviewed: changes noted   1. Chest pain   2. Abdominal pain   3. History of stomach ulcers   4. Anxiety       MDM  Grier Mitts presents with chest pain and L arm discomfort.  Pt with sharp, constant chest pain that increases with palpation and movement, waxing and waning but never resolving.  BMP, lipase, CBC, CXR unremarkable.  Troponin negative.  Likely pleuritic chest pain or referred pain from his PUD.  Patient is to be discharged with recommendation to follow up with PCP in regards to today's hospital visit. Chest pain is not likely of cardiac or pulmonary etiology d/t presentation, perc negative, VSS, no tracheal deviation, no JVD or new murmur, RRR, breath sounds equal bilaterally, EKG without acute abnormalities, negative troponin, and negative CXR. Pt has been advised continue his PPI and return to the ED is CP becomes exertional, associated with diaphoresis or nausea, radiates to left jaw/arm, worsens or becomes concerning in any way. Pt appears reliable for follow up and is agreeable  to discharge.   Case has been discussed  with Dr. Lynelle Doctor who agrees with the above plan to discharge.   1. Medications: usual home medications 2. Treatment: rest, oral hydration, NSAIDs as prescribed by your GI doctor for pain 3. Follow Up: PCP tomorrow for further evaluation  Read instructions below for reasons to return to the Emergency Department. It is recommended that your follow up with your Primary Care Doctor in regards to today's visit. If you do not have a doctor, use the resource guide listed below to help you find one. Begin taking over the counter Prilosec or Zegrid as directed.   Chest Pain (Nonspecific)  HOME CARE INSTRUCTIONS  For the next few days, avoid physical activities that bring on chest pain. Continue physical activities as directed.  Do not smoke cigarettes or drink alcohol until your symptoms are gone.  Only take over-the-counter or prescription medicine for pain, discomfort, or fever as directed by your caregiver.  Follow your caregiver's suggestions for further testing if your chest pain does not go away.  Keep any follow-up appointments you made. If you do not go to an appointment, you could develop lasting (chronic) problems with pain. If there is any problem keeping an appointment, you must call to reschedule.  SEEK MEDICAL CARE IF:  You think you are having problems from the medicine you are taking. Read your medicine instructions carefully.  Your chest pain does not go away, even after treatment.  You develop a rash with blisters on your chest.  SEEK IMMEDIATE MEDICAL CARE IF:  You have increased chest pain or pain that spreads to your arm, neck, jaw, back, or belly (abdomen).  You develop shortness of breath, an increasing cough, or you are coughing up blood.  You have severe back or abdominal pain, feel sick to your stomach (nauseous) or throw up (vomit).  You develop severe weakness, fainting, or chills.  You have an oral temperature above 102 F (38.9 C), not controlled by medicine.   THIS IS  AN EMERGENCY. Do not wait to see if the pain will go away. Get medical help at once. Call your local emergency services (911 in [place] Do not drive yourself to the hospital.   RESOURCE GUIDE  Dental Problems  Patients with Medicaid: [facility] [phone] [address]                                           [phone] [address] Phone:  [phone]                                                  Phone:  [phone]  If unable to pay or uninsured, contact:  [facility] or [facility]. to become qualified for the adult dental clinic.  Chronic Pain Problems Contact [facility]  [phone] Patients need to be referred by their primary care doctor.  Insufficient Money for Medicine Contact [employer]:  call "211" or [facility] [phone].  No Primary Care Doctor Call [facility]  [phone] Other agencies that provide inexpensive medical care  [facility]  [phone]    [facility]  [phone]    [facility]  [phone]    [facility]  [phone]    [facility]  [phone]    [facility]  [phone]  Psychological Services [facility]  [phone] [facility]  [phone] [facility] Mental Health   2311404695 (emergency services ([phone])  Substance Abuse Resources [facility]  902-438-8500 [facility] [phone] [facility] [phone] Floydene Flock [phone] Residential & Outpatient Substance Abuse Program  ([phone]  Abuse/Neglect [facility] Child Abuse Hotline 608-434-9733 [facility] Child Abuse Hotline (930)316-2528 (After Hours)  Emergency Shelter [facility] 904-318-7591  Maternity Homes [facility] [place] 9344189037 [facility] Services ([phone]  MRSA Hotline #:   651-884-2996    [facility]  Resources  [facility] [place]     [employer]                          [facility] Dept. [address] [place]                       [address]      [address]  Blondell Reveal Phone:  [phone]                                   Phone:  [phone]                 Phone:  [phone]  [facility] Mental Health Phone:  [phone]  [facility] Child Abuse Hotline [phone] (After Hours)           Dierdre Forth, PA-C 12/04/11 [phone]

## 2011-12-04 NOTE — ED Provider Notes (Signed)
Medical screening examination/treatment/procedure(s) were performed by non-physician practitioner and as supervising physician I was immediately available for consultation/collaboration.   Celene Kras, MD 12/04/11 [phone]

## 2011-12-04 NOTE — ED Notes (Signed)
Pt presents with CP that started 2 days ago  Radiating to L arm with N/V and back pain

## 2011-12-06 ENCOUNTER — Ambulatory Visit: Payer: BC Managed Care – PPO | Admitting: Family Medicine

## 2011-12-07 ENCOUNTER — Ambulatory Visit: Payer: BC Managed Care – PPO | Admitting: Family Medicine

## 2011-12-07 DIAGNOSIS — Z0289 Encounter for other administrative examinations: Secondary | ICD-10-CM

## 2011-12-08 ENCOUNTER — Ambulatory Visit: Payer: BC Managed Care – PPO | Admitting: Family Medicine

## 2012-01-30 ENCOUNTER — Encounter (HOSPITAL_COMMUNITY): Payer: Self-pay | Admitting: Emergency Medicine

## 2012-01-30 ENCOUNTER — Emergency Department (HOSPITAL_COMMUNITY)
Admission: EM | Admit: 2012-01-30 | Discharge: 2012-01-30 | Disposition: A | Discharge status: Home / Self Care | Payer: BC Managed Care – PPO | Attending: Emergency Medicine | Admitting: Emergency Medicine

## 2012-01-30 ENCOUNTER — Telehealth (HOSPITAL_COMMUNITY): Payer: Self-pay | Admitting: *Deleted

## 2012-01-30 ENCOUNTER — Emergency Department (HOSPITAL_COMMUNITY): Payer: BC Managed Care – PPO

## 2012-01-30 DIAGNOSIS — Z8669 Personal history of other diseases of the nervous system and sense organs: Secondary | ICD-10-CM | POA: Insufficient documentation

## 2012-01-30 DIAGNOSIS — R042 Hemoptysis: Secondary | ICD-10-CM | POA: Insufficient documentation

## 2012-01-30 DIAGNOSIS — Z8679 Personal history of other diseases of the circulatory system: Secondary | ICD-10-CM | POA: Insufficient documentation

## 2012-01-30 DIAGNOSIS — Z8719 Personal history of other diseases of the digestive system: Secondary | ICD-10-CM | POA: Insufficient documentation

## 2012-01-30 DIAGNOSIS — R109 Unspecified abdominal pain: Secondary | ICD-10-CM | POA: Insufficient documentation

## 2012-01-30 DIAGNOSIS — Z8711 Personal history of peptic ulcer disease: Secondary | ICD-10-CM | POA: Insufficient documentation

## 2012-01-30 LAB — COMPREHENSIVE METABOLIC PANEL
Albumin: 4.3 g/dL (ref 3.5–5.2)
BUN: 11 mg/dL (ref 6–23)
Calcium: 9 mg/dL (ref 8.4–10.5)
Creatinine, Ser: 0.97 mg/dL (ref 0.50–1.35)
Potassium: 3.5 mEq/L (ref 3.5–5.1)
Total Protein: 7.2 g/dL (ref 6.0–8.3)

## 2012-01-30 LAB — LIPASE, BLOOD: Lipase: 28 U/L (ref 11–59)

## 2012-01-30 LAB — CBC WITH DIFFERENTIAL/PLATELET
Basophils Relative: 0 % (ref 0–1)
Eosinophils Absolute: 0.2 10*3/uL (ref 0.0–0.7)
Hemoglobin: 17.1 g/dL — ABNORMAL HIGH (ref 13.0–17.0)
MCH: 30.1 pg (ref 26.0–34.0)
MCHC: 36.2 g/dL — ABNORMAL HIGH (ref 30.0–36.0)
Monocytes Relative: 8 % (ref 3–12)
Neutrophils Relative %: 67 % (ref 43–77)

## 2012-01-30 LAB — OCCULT BLOOD, POC DEVICE: Fecal Occult Bld: NEGATIVE

## 2012-01-30 MED ORDER — OMEPRAZOLE 40 MG PO CPDR: 40.0000 mg | DELAYED_RELEASE_CAPSULE | Freq: Every day | ORAL | Status: DC

## 2012-01-30 MED ORDER — MORPHINE SULFATE 4 MG/ML IJ SOLN
4.0000 mg | Freq: Once | INTRAMUSCULAR | Status: AC
  Administered 2012-01-30: 4 mg via INTRAVENOUS
  Filled 2012-01-30: qty 1

## 2012-01-30 MED ORDER — IOHEXOL 300 MG/ML  SOLN
100.0000 mL | Freq: Once | INTRAMUSCULAR | Status: AC | PRN
  Administered 2012-01-30: 80 mL via INTRAVENOUS

## 2012-01-30 MED ORDER — FENTANYL CITRATE 0.05 MG/ML IJ SOLN
INTRAMUSCULAR | Status: AC
  Administered 2012-01-30: 50 ug
  Filled 2012-01-30: qty 2

## 2012-01-30 MED ORDER — PROMETHAZINE HCL 25 MG PO TABS: 25.0000 mg | ORAL_TABLET | Freq: Four times a day (QID) | ORAL | Status: DC | PRN

## 2012-01-30 MED ORDER — ONDANSETRON HCL 4 MG/2ML IJ SOLN
4.0000 mg | Freq: Once | INTRAMUSCULAR | Status: AC
  Administered 2012-01-30: 4 mg via INTRAVENOUS
  Filled 2012-01-30: qty 2

## 2012-01-30 NOTE — ED Notes (Signed)
Pt presenting to ed with c/o right side abdominal pain with positive nausea and vomiting. Pt states symptoms x 2 weeks with worsening pain . Pt states on his way to the hospital he started coughing up blood pt states but I have ulcers.

## 2012-01-30 NOTE — ED Notes (Signed)
MD noified that no urine obtained yet from patient.  No further orders given.

## 2012-01-30 NOTE — ED Provider Notes (Signed)
History     CSN: [id number] Arrival date & time 01/30/12  1752 First MD Initiated Contact with Patient 01/30/12 1858    Chief Complaint  Patient presents with  . Abdominal Pain  .    HPI Patient presents to the emergency room with complaints of right-sided abdominal pain. He has been having this trouble for the last 2 weeks. The pain has been in the right upper abdomen. It is sharp has been progressing. It is now severe. He has had some nausea but had not had any vomiting until this evening. As he was driving to the hospital he had to pull over and vomit. He did notice some blood in the emesis. He had not been having any hematemesis prior to this. He has not noticed any blood in his stool or any dark black stools. Patient does have history of ulcers and prior abdominal pain problems. He has been seen by [facility]. Patient states he did not see anyone prior to this evening because he is "stubborn."   Past Medical History  Diagnosis Date  . Headache   . MVP (mitral valve prolapse)   . Lateral meniscus tear     Right  . Rectal bleeding   . Mallory - Weiss tear   . Migraine with aura   . GERD (gastroesophageal reflux disease)   . History of stomach ulcers     Past Surgical History  Procedure Date  . Elbow surgery 1987    Right  . Appendectomy 1990's  . Knee arthroscopy     Right  . Shoulder surgery 2012    right rotator cuff  . Esophagogastroduodenoscopy 08/18/2011    Procedure: ESOPHAGOGASTRODUODENOSCOPY (EGD);  Surgeon: Beverley Fiedler, MD;  Location: [facility];  Service: Gastroenterology;  Laterality: N/A;  . Esophagogastroduodenoscopy 11/11/2011    Procedure: ESOPHAGOGASTRODUODENOSCOPY (EGD);  Surgeon: Iva Boop, MD;  Location: [facility];  Service: Endoscopy;  Laterality: N/A;    Family History  Problem Relation Age of Onset  . Hypertension Mother   . Stroke Mother   . Migraines Mother   . Diabetes Mother   . Hypertension Father   . Stroke Father   . Heart  failure Father   . Asthma Father   . Colon polyps Father   . Colon cancer Neg Hx   . Prostate cancer Neg Hx     History  Substance Use Topics  . Smoking status: Never Smoker   . Smokeless tobacco: Never Used  . Alcohol Use: No      Review of Systems  Respiratory: Negative for choking and shortness of breath.   Cardiovascular: Negative for chest pain.  Genitourinary: Negative for dysuria.  All other systems reviewed and are negative.    Allergies  Codeine; Hydrocodone-acetaminophen; and Compazine  Home Medications   Current Outpatient Rx  Name  Route  Sig  Dispense  Refill  . IBUPROFEN 200 MG PO TABS   Oral   Take 800 mg by mouth every 6 (six) hours as needed.           There were no vitals taken for this visit.  Physical Exam  Nursing note and vitals reviewed. Constitutional: He appears well-developed and well-nourished. No distress.  HENT:  Head: Normocephalic and atraumatic.  Right Ear: External ear normal.  Left Ear: External ear normal.  Eyes: Conjunctivae normal are normal. Right eye exhibits no discharge. Left eye exhibits no discharge. No scleral icterus.  Neck: Neck supple. No tracheal deviation present.  Cardiovascular:  Normal rate, regular rhythm and intact distal pulses.   Pulmonary/Chest: Effort normal and breath sounds normal. No stridor. No respiratory distress. He has no wheezes. He has no rales.  Abdominal: Soft. Bowel sounds are normal. He exhibits no distension and no mass. There is tenderness in the right upper quadrant and epigastric area. There is no rebound and no guarding. No hernia.  Genitourinary:       Internal hemorrhoids, brown stool  Musculoskeletal: He exhibits no edema and no tenderness.  Neurological: He is alert. He has normal strength. No sensory deficit. Cranial nerve deficit:  no gross defecits noted. He exhibits normal muscle tone. He displays no seizure activity. Coordination normal.  Skin: Skin is warm and dry. No rash  noted.  Psychiatric: He has a normal mood and affect.    ED Course  Procedures (including critical care time)  Labs Reviewed  CBC WITH DIFFERENTIAL - Abnormal; Notable for the following:    Hemoglobin 17.1 (*)     MCHC 36.2 (*)     All other components within normal limits  COMPREHENSIVE METABOLIC PANEL  LIPASE, BLOOD  OCCULT BLOOD, POC DEVICE  URINALYSIS, MICROSCOPIC ONLY   Ct Abdomen Pelvis W Contrast  01/30/2012  *RADIOLOGY REPORT*  Clinical Data: Right-sided abdominal pain  CT ABDOMEN AND PELVIS WITH CONTRAST  Technique:  Multidetector CT imaging of the abdomen and pelvis was performed following the standard protocol during bolus administration of intravenous contrast.  Contrast: 80mL OMNIPAQUE IOHEXOL 300 MG/ML  SOLN  Comparison: 11/10/2011 radiograph, 08/17/2011 CT  Findings: Limited images through the lung bases demonstrate no significant appreciable abnormality. The heart size is within normal limits. No pleural or pericardial effusion.  Unremarkable liver, biliary system, spleen, pancreas, adrenal glands. Upper pole right renal cystic lesion is similar to prior with attenuation slightly higher than that of simple fluid. No change in attenuation on delayed phase.  No hydronephrosis or hydroureter.  No bowel obstruction.  No CT evidence for colitis.  Surgical clips in the right lower quadrant.  No free intraperitoneal air or fluid. No lymphadenopathy.  Normal caliber aorta and branch vessels.  Thin-walled bladder.  Small fat containing left inguinal hernia versus spermatic cord lipoma.  No acute osseous finding.  IMPRESSION: No acute abdominopelvic process identified.   Original Report Authenticated By: Jearld Lesch, M.D.      1. Abdominal pain       MDM  No acute findings noted to account for his symptoms today. No sign of obstruction, acute infection, or other concerning abdominal pathology. No evidence of anemia or active GI bleed based on evaluation. Pt is feeling better  after treatment.  Currently is hungry and wants to eat.  Will dc home with prescription for antiemetic and antacid.        Celene Kras, MD 01/30/12 2250

## 2012-01-30 NOTE — ED Notes (Signed)
Attempted to collect urine once again and patient was unable to void. Consulted Dr. Knapp about an in and out cath and he said that he isn't all to concerned about the urine and if it happens it happens. 

## 2012-01-30 NOTE — ED Notes (Signed)
Patient transported to CT 

## 2012-01-30 NOTE — ED Notes (Signed)
Patient unable to void at this time

## 2012-02-16 ENCOUNTER — Encounter: Payer: Self-pay | Admitting: Family Medicine

## 2012-02-16 ENCOUNTER — Ambulatory Visit (INDEPENDENT_AMBULATORY_CARE_PROVIDER_SITE_OTHER): Payer: BC Managed Care – PPO | Admitting: Family Medicine

## 2012-02-16 VITALS — BP 102/60 | HR 63 | Temp 97.5°F | Wt 134.0 lb

## 2012-02-16 DIAGNOSIS — R109 Unspecified abdominal pain: Secondary | ICD-10-CM

## 2012-02-16 MED ORDER — DICYCLOMINE HCL 20 MG PO TABS: 20.0000 mg | ORAL_TABLET | Freq: Four times a day (QID) | ORAL | Status: DC

## 2012-02-16 NOTE — Patient Instructions (Addendum)
Take bentyl and see if that helps the pain.  Keep taking prilosec/omeprazole daily and phenergan as needed.  Take care.
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
## 2012-02-17 ENCOUNTER — Telehealth: Payer: Self-pay | Admitting: Gastroenterology

## 2012-02-17 NOTE — Telephone Encounter (Signed)
Pt has been scheduled for first available appt he is aware
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
## 2012-02-17 NOTE — Telephone Encounter (Signed)
Pt was seen in ER last week  and Dr Para March yesterday (see note).  Pt says he  was told to f/u with Dr Christella Hartigan.  Has abd pain on the right side, vomiting blood starting on Monday.  No fever, has occasional diarrhea.  Has bleeding hemorrhoids.  Do you want me to work him in today?

## 2012-02-17 NOTE — Telephone Encounter (Signed)
I completely agree DR. Duncan's assessment and plan from his office visit yesterday.  Rov in next few weeks, but does not need urgent double booking

## 2012-03-06 ENCOUNTER — Ambulatory Visit: Payer: BC Managed Care – PPO | Admitting: Gastroenterology

## 2012-03-12 ENCOUNTER — Encounter: Payer: Self-pay | Admitting: Family Medicine

## 2012-03-12 ENCOUNTER — Ambulatory Visit: Payer: BC Managed Care – PPO | Admitting: Family Medicine

## 2012-03-12 ENCOUNTER — Ambulatory Visit (INDEPENDENT_AMBULATORY_CARE_PROVIDER_SITE_OTHER): Payer: BC Managed Care – PPO | Admitting: Family Medicine

## 2012-03-12 VITALS — BP 112/74 | HR 56 | Temp 97.6°F | Wt 137.8 lb

## 2012-03-12 DIAGNOSIS — J029 Acute pharyngitis, unspecified: Secondary | ICD-10-CM

## 2012-03-12 DIAGNOSIS — J069 Acute upper respiratory infection, unspecified: Secondary | ICD-10-CM

## 2012-03-12 LAB — POCT RAPID STREP A (OFFICE): Rapid Strep A Screen: NEGATIVE

## 2012-03-12 MED ORDER — BENZONATATE 200 MG PO CAPS: 200.0000 mg | ORAL_CAPSULE | Freq: Three times a day (TID) | ORAL | Status: AC | PRN

## 2012-03-12 NOTE — Patient Instructions (Addendum)
Take the tessalon for cough.  Drink plenty of fluids, take tylenol as needed, and gargle with warm salt water for your throat.  This should gradually improve.  Take care.  Let us know if you have other concerns.

## 2012-03-12 NOTE — Progress Notes (Signed)
duration of symptoms: 5 days, had to leave work sick.   Started with cough, sleep disrupted.  Pain in chest from coughing.  HA from coughing.   rhinorrhea:no congestion:yes ear pain:no sore throat: yes, likely from cough.  Cough: yes Myalgias: not other than as above No fevers.   He had some diarrhea this AM.    RST neg.  Had a flu shot prev.    ROS: See HPI.  Otherwise negative.    Meds, vitals, and allergies reviewed.   GEN: nad, alert and oriented HEENT: mucous membranes moist, TM w/o erythema, nasal epithelium injected, OP with cobblestoning, sinuses not ttp NECK: supple w/o LA CV: rrr. PULM: ctab, no inc wob ABD: soft, +bs EXT: no edema

## 2012-03-13 DIAGNOSIS — J069 Acute upper respiratory infection, unspecified: Secondary | ICD-10-CM | POA: Insufficient documentation

## 2012-03-13 NOTE — Assessment & Plan Note (Signed)
Nontoxic, tessalon for cough and supportive tx o/w.  F/u prn.  He agrees.

## 2012-03-25 ENCOUNTER — Emergency Department (INDEPENDENT_AMBULATORY_CARE_PROVIDER_SITE_OTHER)
Admission: EM | Admit: 2012-03-25 | Discharge: 2012-03-25 | Disposition: A | Discharge status: Home / Self Care | Payer: BC Managed Care – PPO | Source: Home / Self Care | Attending: Emergency Medicine | Admitting: Emergency Medicine

## 2012-03-25 ENCOUNTER — Encounter (HOSPITAL_COMMUNITY): Payer: Self-pay | Admitting: *Deleted

## 2012-03-25 ENCOUNTER — Emergency Department (INDEPENDENT_AMBULATORY_CARE_PROVIDER_SITE_OTHER): Payer: BC Managed Care – PPO

## 2012-03-25 DIAGNOSIS — J209 Acute bronchitis, unspecified: Secondary | ICD-10-CM

## 2012-03-25 DIAGNOSIS — R0789 Other chest pain: Secondary | ICD-10-CM

## 2012-03-25 MED ORDER — PREDNISONE 20 MG PO TABS: 20.0000 mg | ORAL_TABLET | Freq: Two times a day (BID) | ORAL | Status: DC

## 2012-03-25 MED ORDER — ONDANSETRON 4 MG PO TBDP
8.0000 mg | ORAL_TABLET | Freq: Once | ORAL | Status: AC
  Administered 2012-03-25: 8 mg via ORAL

## 2012-03-25 MED ORDER — OXYCODONE-ACETAMINOPHEN 5-325 MG PO TABS: ORAL_TABLET | ORAL | Status: DC

## 2012-03-25 MED ORDER — ONDANSETRON 4 MG PO TBDP
ORAL_TABLET | ORAL | Status: AC
  Filled 2012-03-25: qty 2

## 2012-03-25 MED ORDER — TRAMADOL HCL 50 MG PO TABS: 100.0000 mg | ORAL_TABLET | Freq: Three times a day (TID) | ORAL | Status: DC | PRN

## 2012-03-25 MED ORDER — HYDROMORPHONE HCL PF 1 MG/ML IJ SOLN
INTRAMUSCULAR | Status: AC
  Filled 2012-03-25: qty 2

## 2012-03-25 MED ORDER — HYDROMORPHONE HCL PF 1 MG/ML IJ SOLN
2.0000 mg | Freq: Once | INTRAMUSCULAR | Status: AC
  Administered 2012-03-25: 2 mg via INTRAMUSCULAR

## 2012-03-25 MED ORDER — AZITHROMYCIN 250 MG PO TABS: ORAL_TABLET | ORAL | Status: DC

## 2012-03-25 NOTE — ED Notes (Signed)
Holding Hydromorphone and ondansetron until patient can get a responsible party to drive for him on site.
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
## 2012-03-25 NOTE — ED Provider Notes (Signed)
Chief Complaint  Patient presents with  . 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 The chest pain is worse if he coughs, moves, takes a deep breath, twist, or bends. He's had some numbness of his face. He has had some posttussive vomiting. He denies any headache, nasal congestion, rhinorrhea, or sore throat. He has not had any diarrhea. He has a history of stomach ulcers and a tear to his esophagus. He's allergic to several meds including codeine, Compazine, and hydrocodone. He has a history of mitral valve prolapse.  Review of Systems:  Other than noted above, the patient denies any of the following symptoms. Systemic:  No fever, chills, sweats, fatigue, myalgias, headache, or anorexia. Eye:  No redness, pain or drainage. ENT:  No earache, ear congestion, nasal congestion, sneezing, rhinorrhea, sinus pressure, sinus pain, post nasal drip, or sore throat. Lungs:  No cough, sputum production, wheezing, shortness of breath, or chest pain. GI:  No abdominal pain, nausea, vomiting, or diarrhea.  PMFSH:  Past medical history, family history, social history, meds, and allergies were reviewed.  Physical Exam:   Vital signs:  There were no vitals taken for this visit. General:  Alert, he appears in considerable distress due to the chest discomfort, has paroxysms of coughing, appears anxious and nervous and at times is hyperventilating. Eye:  No conjunctival injection or drainage. Lids were normal. ENT:  TMs and canals were normal, without erythema or inflammation.  Nasal mucosa was clear and uncongested, without drainage.  Mucous membranes were moist.  Pharynx was clear, without exudate or drainage.  There were no oral ulcerations or lesions. Neck:  Supple, no adenopathy, tenderness or mass. Lungs:  No respiratory  distress.  Lungs were clear to auscultation, without wheezes, rales or rhonchi.  Breath sounds were clear and equal bilaterally.  Heart:  Regular rhythm, without gallops, murmers or rubs. Chest: He has exquisite tenderness to palpation over the lower sternum and to a lesser extent over the rib cage. Abdomen: Soft, nontender, no organomegaly or mass. Skin:  Clear, warm, and dry, without rash or lesions.  Radiology:  Dg Chest 2 View  03/25/2012  *RADIOLOGY REPORT*  Clinical Data: Cough and chest pain  CHEST - 2 VIEW  Comparison: None  Findings: The heart size and mediastinal contours are within normal limits.  Both lungs are clear.  The visualized skeletal structures are unremarkable.  IMPRESSION: Negative exam.   Original Report Authenticated By: Signa Kell, M.D.    I reviewed the images independently and personally and concur with the radiologist's findings.   Date: 03/25/2012  Rate: 102  Rhythm: normal sinus rhythm  QRS Axis: normal  Intervals: normal  ST/T Wave abnormalities: normal  Conduction Disutrbances:none  Narrative Interpretation: Sinus tachycardia, otherwise normal EKG.  Old EKG Reviewed: none available  Course in Urgent Care Center:   He was given Zofran ODT 8 mg sublingually followed by Dilaudid 2 mg IM with some relief of his chest discomfort.  Assessment:  The primary encounter diagnosis was Acute bronchitis. A diagnosis of Muscular chest pain was also pertinent to this visit.  Plan:   1.  The following meds were prescribed:   New Prescriptions   AZITHROMYCIN (ZITHROMAX Z-PAK) 250 MG TABLET    Take as directed.   OXYCODONE-ACETAMINOPHEN (PERCOCET) 5-325 MG PER TABLET  1 to 2 tablets every 6 hours as needed for pain.   PREDNISONE (DELTASONE) 20 MG TABLET    Take 1 tablet (20 mg total) by mouth 2 (two) times daily.   TRAMADOL (ULTRAM) 50 MG TABLET    Take 2 tablets (100 mg total) by mouth every 8 (eight) hours as needed for pain.   His girlfriend has some albuterol  HFA inhalers at home as well as mobile nebulizer which she can give him. I recommended that he do this about every 4 hours and was given a note to remain out of work for several days.  2.  The patient was instructed in symptomatic care and handouts were given. 3.  The patient was told to return if becoming worse in any way, if no better in 3 or 4 days, and given some red flag symptoms that would indicate earlier return.   Reuben Likes, MD 03/25/12 [phone]

## 2012-03-25 NOTE — ED Notes (Signed)
Patient's girlfriend arrived and Dilaudid was administered per provider orders.

## 2012-03-25 NOTE — ED Notes (Signed)
Patient states his girl friend is on her way to office and will take him home. I will administer the medication once she is here.

## 2012-05-16 ENCOUNTER — Encounter (HOSPITAL_COMMUNITY): Payer: Self-pay | Admitting: Emergency Medicine

## 2012-05-16 ENCOUNTER — Emergency Department (HOSPITAL_COMMUNITY): Payer: BC Managed Care – PPO

## 2012-05-16 ENCOUNTER — Emergency Department (HOSPITAL_COMMUNITY)
Admission: EM | Admit: 2012-05-16 | Discharge: 2012-05-16 | Disposition: A | Discharge status: Home / Self Care | Payer: BC Managed Care – PPO | Attending: Emergency Medicine | Admitting: Emergency Medicine

## 2012-05-16 DIAGNOSIS — Z87828 Personal history of other (healed) physical injury and trauma: Secondary | ICD-10-CM | POA: Insufficient documentation

## 2012-05-16 DIAGNOSIS — Z8711 Personal history of peptic ulcer disease: Secondary | ICD-10-CM | POA: Insufficient documentation

## 2012-05-16 DIAGNOSIS — R0789 Other chest pain: Secondary | ICD-10-CM | POA: Insufficient documentation

## 2012-05-16 DIAGNOSIS — G8929 Other chronic pain: Secondary | ICD-10-CM | POA: Insufficient documentation

## 2012-05-16 DIAGNOSIS — R1013 Epigastric pain: Secondary | ICD-10-CM | POA: Insufficient documentation

## 2012-05-16 DIAGNOSIS — Z8719 Personal history of other diseases of the digestive system: Secondary | ICD-10-CM | POA: Insufficient documentation

## 2012-05-16 DIAGNOSIS — Z8679 Personal history of other diseases of the circulatory system: Secondary | ICD-10-CM | POA: Insufficient documentation

## 2012-05-16 LAB — COMPREHENSIVE METABOLIC PANEL
BUN: 11 mg/dL (ref 6–23)
CO2: 27 mEq/L (ref 19–32)
Chloride: 101 mEq/L (ref 96–112)
Creatinine, Ser: 0.89 mg/dL (ref 0.50–1.35)
GFR calc Af Amer: >90 mL/min (ref >90)
GFR calc non Af Amer: >90 mL/min (ref >90)
Glucose, Bld: 92 mg/dL (ref 70–99)
Total Bilirubin: 0.4 mg/dL (ref 0.3–1.2)

## 2012-05-16 LAB — POCT I-STAT TROPONIN I: Troponin i, poc: 0 ng/mL (ref 0.00–0.08)

## 2012-05-16 LAB — CBC WITH DIFFERENTIAL/PLATELET
HCT: 44.4 % (ref 39.0–52.0)
Hemoglobin: 16.5 g/dL (ref 13.0–17.0)
Lymphocytes Relative: 14 % (ref 12–46)
MCV: 82.4 fL (ref 78.0–100.0)
Monocytes Absolute: 0.7 10*3/uL (ref 0.1–1.0)
Monocytes Relative: 5 % (ref 3–12)
Neutro Abs: 10 10*3/uL — ABNORMAL HIGH (ref 1.7–7.7)
WBC: 12.6 10*3/uL — ABNORMAL HIGH (ref 4.0–10.5)

## 2012-05-16 MED ORDER — SODIUM CHLORIDE 0.9 % IV BOLUS (SEPSIS)
1000.0000 mL | Freq: Once | INTRAVENOUS | Status: AC
  Administered 2012-05-16: 1000 mL via INTRAVENOUS

## 2012-05-16 MED ORDER — HYDROMORPHONE HCL PF 1 MG/ML IJ SOLN
1.0000 mg | Freq: Once | INTRAMUSCULAR | Status: AC
  Administered 2012-05-16: 1 mg via INTRAVENOUS
  Filled 2012-05-16: qty 1

## 2012-05-16 MED ORDER — MORPHINE SULFATE 4 MG/ML IJ SOLN
4.0000 mg | Freq: Once | INTRAMUSCULAR | Status: AC
  Administered 2012-05-16: 4 mg via INTRAVENOUS
  Filled 2012-05-16: qty 1

## 2012-05-16 MED ORDER — ONDANSETRON HCL 4 MG/2ML IJ SOLN
4.0000 mg | Freq: Once | INTRAMUSCULAR | Status: AC
  Administered 2012-05-16: 4 mg via INTRAVENOUS
  Filled 2012-05-16: qty 2

## 2012-05-16 NOTE — ED Notes (Signed)
Pt. ambulated well in hallway. Stated "I'm a little dizzy."
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
## 2012-05-18 NOTE — ED Provider Notes (Signed)
Medical screening examination/treatment/procedure(s) were conducted as a shared visit with non-physician practitioner(s) and myself.  I personally evaluated the patient during the encounter  His history on my eval was not c/w with ACS I doubt PE Stable for d/c  Joya Gaskins, MD 05/18/12 [phone]

## 2012-08-27 ENCOUNTER — Emergency Department (HOSPITAL_COMMUNITY)
Admission: EM | Admit: 2012-08-27 | Discharge: 2012-08-27 | Disposition: A | Discharge status: Home / Self Care | Payer: BC Managed Care – PPO | Attending: Emergency Medicine | Admitting: Emergency Medicine

## 2012-08-27 ENCOUNTER — Emergency Department (HOSPITAL_COMMUNITY): Payer: BC Managed Care – PPO

## 2012-08-27 ENCOUNTER — Encounter (HOSPITAL_COMMUNITY): Payer: Self-pay | Admitting: Emergency Medicine

## 2012-08-27 DIAGNOSIS — S335XXA Sprain of ligaments of lumbar spine, initial encounter: Secondary | ICD-10-CM | POA: Insufficient documentation

## 2012-08-27 DIAGNOSIS — Z87828 Personal history of other (healed) physical injury and trauma: Secondary | ICD-10-CM | POA: Insufficient documentation

## 2012-08-27 DIAGNOSIS — Y9389 Activity, other specified: Secondary | ICD-10-CM | POA: Insufficient documentation

## 2012-08-27 DIAGNOSIS — Y9289 Other specified places as the place of occurrence of the external cause: Secondary | ICD-10-CM | POA: Insufficient documentation

## 2012-08-27 DIAGNOSIS — IMO0002 Reserved for concepts with insufficient information to code with codable children: Secondary | ICD-10-CM | POA: Insufficient documentation

## 2012-08-27 DIAGNOSIS — Z8719 Personal history of other diseases of the digestive system: Secondary | ICD-10-CM | POA: Insufficient documentation

## 2012-08-27 DIAGNOSIS — S39012A Strain of muscle, fascia and tendon of lower back, initial encounter: Secondary | ICD-10-CM

## 2012-08-27 DIAGNOSIS — M5416 Radiculopathy, lumbar region: Secondary | ICD-10-CM

## 2012-08-27 DIAGNOSIS — Z8679 Personal history of other diseases of the circulatory system: Secondary | ICD-10-CM | POA: Insufficient documentation

## 2012-08-27 DIAGNOSIS — Y990 Civilian activity done for income or pay: Secondary | ICD-10-CM | POA: Insufficient documentation

## 2012-08-27 DIAGNOSIS — X008XXA Other exposure to uncontrolled fire in building or structure, initial encounter: Secondary | ICD-10-CM | POA: Insufficient documentation

## 2012-08-27 MED ORDER — HYDROMORPHONE HCL PF 1 MG/ML IJ SOLN
1.0000 mg | Freq: Once | INTRAMUSCULAR | Status: AC
  Administered 2012-08-27: 1 mg via INTRAMUSCULAR
  Filled 2012-08-27: qty 1

## 2012-08-27 MED ORDER — OXYCODONE-ACETAMINOPHEN 5-325 MG PO TABS: 2.0000 | ORAL_TABLET | ORAL | Status: DC | PRN

## 2012-08-27 MED ORDER — PREDNISONE 50 MG PO TABS: 50.0000 mg | ORAL_TABLET | Freq: Every day | ORAL | Status: DC

## 2012-08-27 MED ORDER — HYDROMORPHONE HCL PF 1 MG/ML IJ SOLN
1.0000 mg | Freq: Once | INTRAMUSCULAR | Status: DC
  Filled 2012-08-27: qty 1

## 2012-08-27 MED ORDER — HYDROMORPHONE HCL PF 1 MG/ML IJ SOLN: 1.0000 mg | Freq: Once | INTRAMUSCULAR | Status: DC

## 2012-08-27 MED ORDER — DIAZEPAM 5 MG PO TABS: 5.0000 mg | ORAL_TABLET | Freq: Three times a day (TID) | ORAL | Status: DC | PRN

## 2012-08-27 MED ORDER — HYDROMORPHONE HCL PF 1 MG/ML IJ SOLN
1.0000 mg | Freq: Once | INTRAMUSCULAR | Status: AC
  Administered 2012-08-27: 1 mg via INTRAVENOUS

## 2012-08-27 MED ORDER — MORPHINE SULFATE 4 MG/ML IJ SOLN
6.0000 mg | Freq: Once | INTRAMUSCULAR | Status: AC
  Administered 2012-08-27: 6 mg via INTRAVENOUS
  Filled 2012-08-27: qty 2

## 2012-08-27 MED ORDER — ONDANSETRON HCL 4 MG/2ML IJ SOLN
4.0000 mg | Freq: Once | INTRAMUSCULAR | Status: AC
  Administered 2012-08-27: 4 mg via INTRAVENOUS
  Filled 2012-08-27: qty 2

## 2012-08-27 MED ORDER — DIAZEPAM 5 MG PO TABS
5.0000 mg | ORAL_TABLET | Freq: Once | ORAL | Status: AC
  Administered 2012-08-27: 5 mg via ORAL
  Filled 2012-08-27: qty 1

## 2012-08-27 MED ORDER — KETOROLAC TROMETHAMINE 60 MG/2ML IM SOLN
60.0000 mg | Freq: Once | INTRAMUSCULAR | Status: AC
  Administered 2012-08-27: 60 mg via INTRAMUSCULAR
  Filled 2012-08-27: qty 2

## 2012-08-27 MED ORDER — MORPHINE SULFATE 4 MG/ML IJ SOLN
4.0000 mg | Freq: Once | INTRAMUSCULAR | Status: AC
  Administered 2012-08-27: 4 mg via INTRAVENOUS
  Filled 2012-08-27: qty 1

## 2012-08-27 MED ORDER — DEXAMETHASONE SODIUM PHOSPHATE 10 MG/ML IJ SOLN
10.0000 mg | Freq: Once | INTRAMUSCULAR | Status: AC
  Administered 2012-08-27: 10 mg via INTRAVENOUS
  Filled 2012-08-27: qty 1

## 2012-08-27 NOTE — ED Notes (Addendum)
Pt daughter Mikki Harbor [phone]  Pt mother Emilio Baylock [phone]
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
## 2012-08-28 NOTE — ED Provider Notes (Signed)
Medical screening examination/treatment/procedure(s) were performed by non-physician practitioner and as supervising physician I was immediately available for consultation/collaboration.  Raeford Razor, MD 08/28/12 [phone]

## 2012-10-24 ENCOUNTER — Ambulatory Visit: Payer: BC Managed Care – PPO | Admitting: Family Medicine

## 2012-10-24 ENCOUNTER — Telehealth: Payer: Self-pay | Admitting: Family Medicine

## 2012-10-24 ENCOUNTER — Encounter: Payer: Self-pay | Admitting: Family Medicine

## 2012-10-24 ENCOUNTER — Ambulatory Visit (INDEPENDENT_AMBULATORY_CARE_PROVIDER_SITE_OTHER): Payer: BC Managed Care – PPO | Admitting: Family Medicine

## 2012-10-24 VITALS — BP 118/78 | HR 76 | Temp 97.8°F | Wt 146.8 lb

## 2012-10-24 DIAGNOSIS — M549 Dorsalgia, unspecified: Secondary | ICD-10-CM

## 2012-10-24 MED ORDER — CYCLOBENZAPRINE HCL 10 MG PO TABS: 5.0000 mg | ORAL_TABLET | Freq: Three times a day (TID) | ORAL | Status: DC | PRN

## 2012-10-24 NOTE — Telephone Encounter (Signed)
Pt him on with me at 4PM.  I'll need 30 min. Thanks.

## 2012-10-24 NOTE — Patient Instructions (Addendum)
Try the flexeril for now and if not improved then notify me.  You can take the oxycodone if needed.  Both meds can make you drowsy.  I'll work on Environmental manager.  Take care.

## 2012-10-24 NOTE — Telephone Encounter (Signed)
R/s w/Dr. Para March @ 4:15 for 30 mins.

## 2012-10-24 NOTE — Telephone Encounter (Signed)
Pt called in and says he's having severe lower back pain, says he had scoliosis as a child.  He says he was in the ER [facility]) a couple weeks ago and they seemed to think his pain is from the curvature of his spine and told him if he doesn't get better to follow up w/his PCP.  He called this morning saying his pain was so severe it kept him awake last night.  I have scheduled him an apptmt at 4:00 p.m. W/Dr. Ermalene Searing, but he was reluctant for that b/c he wants to see you.  He says you are his PCP and are aware of his condition.  Is there anyway you could work him in today?? He was very concerned about seeing only you.  Thank you.

## 2012-10-25 ENCOUNTER — Telehealth: Payer: Self-pay | Admitting: Family Medicine

## 2012-10-25 DIAGNOSIS — M549 Dorsalgia, unspecified: Secondary | ICD-10-CM | POA: Insufficient documentation

## 2012-10-25 NOTE — Assessment & Plan Note (Signed)
Advised to limit then stop nsaids given his hx.  Use left over percocet, add on flexeril.  If not improved, will likely need referral to spine/ortho/PT.  Prev imaging reviewed and discussed.  Papers done as pt should not be at work currently. >25 min spent with face to face with patient, >50% counseling and/or coordinating care.

## 2012-10-25 NOTE — Telephone Encounter (Signed)
Refer to ortho.

## 2012-10-25 NOTE — Telephone Encounter (Signed)
Pt wants to let you know that the percocet is not helping his pain and wants a referral to a dr for his back. He has not yet taken the flexril, but is going to pick it up now and see if it helps.  He says he is in severe pain and still not sleeping. Thank you.
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
## 2012-10-25 NOTE — Telephone Encounter (Signed)
Mr. Mannor says he has taken the Flexeril and is still in a fair amount of pain.  He said you mentioned a referral.  Please advise.

## 2012-10-29 ENCOUNTER — Other Ambulatory Visit: Payer: Self-pay | Admitting: Family Medicine

## 2012-10-29 ENCOUNTER — Ambulatory Visit
Admission: RE | Admit: 2012-10-29 | Discharge: 2012-10-29 | Disposition: A | Discharge status: Home / Self Care | Payer: BC Managed Care – PPO | Source: Ambulatory Visit | Attending: Family Medicine | Admitting: Family Medicine

## 2012-10-29 DIAGNOSIS — M549 Dorsalgia, unspecified: Secondary | ICD-10-CM

## 2012-10-29 MED ORDER — IOHEXOL 180 MG/ML  SOLN
1.0000 mL | Freq: Once | INTRAMUSCULAR | Status: AC | PRN
  Administered 2012-10-29: 1 mL via INTRA_ARTICULAR

## 2012-10-29 MED ORDER — METHYLPREDNISOLONE ACETATE 40 MG/ML INJ SUSP (RADIOLOG
120.0000 mg | Freq: Once | INTRAMUSCULAR | Status: AC
  Administered 2012-10-29: 120 mg via INTRA_ARTICULAR

## 2014-02-03 ENCOUNTER — Emergency Department (HOSPITAL_COMMUNITY): Payer: BC Managed Care – PPO

## 2014-02-03 ENCOUNTER — Emergency Department (HOSPITAL_COMMUNITY)
Admission: EM | Admit: 2014-02-03 | Discharge: 2014-02-04 | Disposition: A | Discharge status: Home / Self Care | Payer: BC Managed Care – PPO | Attending: Emergency Medicine | Admitting: Emergency Medicine

## 2014-02-03 ENCOUNTER — Encounter (HOSPITAL_COMMUNITY): Payer: Self-pay | Admitting: Emergency Medicine

## 2014-02-03 DIAGNOSIS — Z8719 Personal history of other diseases of the digestive system: Secondary | ICD-10-CM | POA: Insufficient documentation

## 2014-02-03 DIAGNOSIS — G43109 Migraine with aura, not intractable, without status migrainosus: Secondary | ICD-10-CM | POA: Insufficient documentation

## 2014-02-03 DIAGNOSIS — J40 Bronchitis, not specified as acute or chronic: Secondary | ICD-10-CM | POA: Diagnosis not present

## 2014-02-03 DIAGNOSIS — H00016 Hordeolum externum left eye, unspecified eyelid: Secondary | ICD-10-CM

## 2014-02-03 DIAGNOSIS — R079 Chest pain, unspecified: Secondary | ICD-10-CM | POA: Diagnosis present

## 2014-02-03 DIAGNOSIS — Z8679 Personal history of other diseases of the circulatory system: Secondary | ICD-10-CM | POA: Diagnosis not present

## 2014-02-03 DIAGNOSIS — Z79899 Other long term (current) drug therapy: Secondary | ICD-10-CM | POA: Insufficient documentation

## 2014-02-03 DIAGNOSIS — Z87828 Personal history of other (healed) physical injury and trauma: Secondary | ICD-10-CM | POA: Diagnosis not present

## 2014-02-03 DIAGNOSIS — H00015 Hordeolum externum left lower eyelid: Secondary | ICD-10-CM | POA: Insufficient documentation

## 2014-02-03 LAB — BASIC METABOLIC PANEL
ANION GAP: 13 (ref 5–15)
BUN: 12 mg/dL (ref 6–23)
CHLORIDE: 99 meq/L (ref 96–112)
CO2: 27 mEq/L (ref 19–32)
Calcium: 9.6 mg/dL (ref 8.4–10.5)
Creatinine, Ser: 1.23 mg/dL (ref 0.50–1.35)
GFR, EST AFRICAN AMERICAN: 81 mL/min — AB (ref >90)
GFR, EST NON AFRICAN AMERICAN: 70 mL/min — AB (ref >90)
Glucose, Bld: 95 mg/dL (ref 70–99)
POTASSIUM: 3.7 meq/L (ref 3.7–5.3)
SODIUM: 139 meq/L (ref 137–147)

## 2014-02-03 LAB — CBC
HCT: 46.2 % (ref 39.0–52.0)
Hemoglobin: 16.6 g/dL (ref 13.0–17.0)
MCH: 30.2 pg (ref 26.0–34.0)
MCHC: 35.9 g/dL (ref 30.0–36.0)
MCV: 84 fL (ref 78.0–100.0)
PLATELETS: 230 10*3/uL (ref 150–400)
RBC: 5.5 MIL/uL (ref 4.22–5.81)
RDW: 12.8 % (ref 11.5–15.5)
WBC: 9.7 10*3/uL (ref 4.0–10.5)

## 2014-02-03 LAB — D-DIMER, QUANTITATIVE (NOT AT ARMC)

## 2014-02-03 LAB — I-STAT TROPONIN, ED: Troponin i, poc: 0.01 ng/mL (ref 0.00–0.08)

## 2014-02-03 MED ORDER — PREDNISONE 10 MG PO TABS: 20.0000 mg | ORAL_TABLET | Freq: Every day | ORAL | Status: DC

## 2014-02-03 MED ORDER — ALBUTEROL SULFATE HFA 108 (90 BASE) MCG/ACT IN AERS: 1.0000 | INHALATION_SPRAY | Freq: Four times a day (QID) | RESPIRATORY_TRACT | Status: DC | PRN

## 2014-02-03 MED ORDER — FENTANYL CITRATE 0.05 MG/ML IJ SOLN
50.0000 ug | Freq: Once | INTRAMUSCULAR | Status: AC
  Administered 2014-02-03: 50 ug via INTRAVENOUS
  Filled 2014-02-03: qty 2

## 2014-02-03 MED ORDER — METHYLPREDNISOLONE SODIUM SUCC 125 MG IJ SOLR
125.0000 mg | Freq: Once | INTRAMUSCULAR | Status: AC
  Administered 2014-02-03: 125 mg via INTRAVENOUS
  Filled 2014-02-03: qty 2

## 2014-02-03 MED ORDER — ASPIRIN 325 MG PO TABS
325.0000 mg | ORAL_TABLET | ORAL | Status: AC
  Administered 2014-02-03: 325 mg via ORAL
  Filled 2014-02-03 (×2): qty 1

## 2014-02-03 MED ORDER — NITROGLYCERIN 0.4 MG SL SUBL
0.4000 mg | SUBLINGUAL_TABLET | SUBLINGUAL | Status: DC | PRN
  Administered 2014-02-03 (×3): 0.4 mg via SUBLINGUAL
  Filled 2014-02-03: qty 1

## 2014-02-03 MED ORDER — KETOROLAC TROMETHAMINE 30 MG/ML IJ SOLN
30.0000 mg | Freq: Once | INTRAMUSCULAR | Status: AC
  Administered 2014-02-03: 30 mg via INTRAVENOUS
  Filled 2014-02-03: qty 1

## 2014-02-03 MED ORDER — ALBUTEROL SULFATE (2.5 MG/3ML) 0.083% IN NEBU
5.0000 mg | INHALATION_SOLUTION | Freq: Once | RESPIRATORY_TRACT | Status: AC
  Administered 2014-02-03: 5 mg via RESPIRATORY_TRACT
  Filled 2014-02-03: qty 6

## 2014-02-03 NOTE — ED Notes (Signed)
43 yo pt presents with chest pain with eye and nose swelling, head and neck pain.  Denies N/V but has had diarrhea for past few days.  Pt stated this episode started at 1:00 pm today and grew worse as the day went on.  Pt has had nonproductive cough and diarrhea since Saturday.  Eye and nose swelling started today.

## 2014-02-03 NOTE — ED Provider Notes (Signed)
CSN: [id number]     Arrival date & time 02/03/14  2037 History   First MD Initiated Contact with Patient 02/03/14 2138     Chief Complaint  Patient presents with  . Chest Pain  . Facial Swelling     (Consider location/radiation/quality/duration/timing/severity/associated sxs/prior Treatment) HPI Comments: Patient is here complaining of cough or congestion 2 days. Cough has been nonproductive. Also notes left eye swelling without drainage. Swelling is localized to his lower lid. No fever or chills. Complains of  Chest pain has been constant 1 day rest worse with taking deep breath. Denies any syncope or near-syncope. No treatment use prior to arrival. He also had some diarrhea which is since resolved. No vomiting noted. Denies any urinary symptoms  Patient is a 43 y.o. male presenting with chest pain. The history is provided by the patient.  Chest Pain   Past Medical History  Diagnosis Date  . Headache(784.0)   . MVP (mitral valve prolapse)   . Lateral meniscus tear     Right  . Rectal bleeding   . Mallory - Weiss tear   . Migraine with aura   . GERD (gastroesophageal reflux disease)   . History of stomach ulcers    Past Surgical History  Procedure Laterality Date  . Elbow surgery  1987    Right  . Appendectomy  1990's  . Knee arthroscopy      Right  . Shoulder surgery  2012    right rotator cuff  . Esophagogastroduodenoscopy  08/18/2011    Procedure: ESOPHAGOGASTRODUODENOSCOPY (EGD);  Surgeon: Beverley FiedlerJay M Pyrtle, MD;  [facility];  Service: Gastroenterology;  Laterality: N/A;  . Esophagogastroduodenoscopy  11/11/2011    Procedure: ESOPHAGOGASTRODUODENOSCOPY (EGD);  Surgeon: Iva Booparl E Gessner, MD;  [facility];  Service: Endoscopy;  Laterality: N/A;   Family History  Problem Relation Age of Onset  . Hypertension Mother   . Stroke Mother   . Migraines Mother   . Diabetes Mother   . Hypertension Father   . Stroke Father   . Heart failure Father   . Asthma  Father   . Colon polyps Father   . Colon cancer Neg Hx   . Prostate cancer Neg Hx    History  Substance Use Topics  . Smoking status: Never Smoker   . Smokeless tobacco: Never Used  . Alcohol Use: No    Review of Systems  Cardiovascular: Positive for chest pain.  All other systems reviewed and are negative.     Allergies  Codeine; Hydrocodone-acetaminophen; and Compazine  Home Medications   Prior to Admission medications   Medication Sig Start Date End Date Taking? Authorizing Provider  acetaminophen (TYLENOL) 325 MG tablet Take 650 mg by mouth every 6 (six) hours as needed for moderate pain.   Yes Historical Provider, MD  cyclobenzaprine (FLEXERIL) 10 MG tablet Take 0.5-1 tablets (5-10 mg total) by mouth 3 (three) times daily as needed for muscle spasms. 10/24/12   Joaquim NamGraham S Duncan, MD  diazepam (VALIUM) 5 MG tablet Take 1 tablet (5 mg total) by mouth every 8 (eight) hours as needed for anxiety. 08/27/12   Carlyle Dollyhristopher W Lawyer, PA-C  oxyCODONE-acetaminophen (PERCOCET/ROXICET) 5-325 MG per tablet Take 2 tablets by mouth every 4 (four) hours as needed for pain. 08/27/12   Jamesetta Orleanshristopher W Lawyer, PA-C   BP 111/72 mmHg  Pulse 65  Temp(Src) 97.8 F (36.6 C) (Oral)  Resp 20  Ht 5\' 7"  (1.702 m)  Wt 162 lb (73.483 kg)  BMI  25.37 kg/m2  SpO2 97% Physical Exam  Constitutional: He is oriented to person, place, and time. He appears well-developed and well-nourished.  Non-toxic appearance. No distress.  HENT:  Head: Normocephalic and atraumatic.    Eyes: Conjunctivae, EOM and lids are normal. Pupils are equal, round, and reactive to light.  Neck: Normal range of motion. Neck supple. No tracheal deviation present. No thyroid mass present.  Cardiovascular: Normal rate, regular rhythm and normal heart sounds.  Exam reveals no gallop.   No murmur heard. Pulmonary/Chest: Effort normal. No stridor. No respiratory distress. He has decreased breath sounds. He has no wheezes. He has no  rhonchi. He has no rales.  Abdominal: Soft. Normal appearance and bowel sounds are normal. He exhibits no distension. There is no tenderness. There is no rebound and no CVA tenderness.  Musculoskeletal: Normal range of motion. He exhibits no edema or tenderness.  Neurological: He is alert and oriented to person, place, and time. He has normal strength. No cranial nerve deficit or sensory deficit. GCS eye subscore is 4. GCS verbal subscore is 5. GCS motor subscore is 6.  Skin: Skin is warm and dry. No abrasion and no rash noted.  Psychiatric: He has a normal mood and affect. His speech is normal and behavior is normal.  Nursing note and vitals reviewed.   ED Course  Procedures (including critical care time) Labs Review Labs Reviewed  CBC  BASIC METABOLIC PANEL  D-DIMER, QUANTITATIVE  I-STAT TROPOININ, ED    Imaging Review No results found.   EKG Interpretation   Date/Time:  Monday February 03 2014 20:45:05 EST Ventricular Rate:  74 PR Interval:  132 QRS Duration: 89 QT Interval:  367 QTC Calculation: 407 R Axis:   -39 Text Interpretation:  Sinus rhythm Left axis deviation No significant  change since last tracing Confirmed by Marcela Alatorre  MD, Leialoha Hanna ([id number]) on  02/03/2014 9:39:30 PM      MDM   Final diagnoses:  None    Patient given Toradol and feels better. D-dimer negative. No concern for ACS. Patient has a stye on his left eye. Suspect that he has bronchitis. Was also given same as on albuterol. We'll discharge with same and conservative treatment for his eye issue    Toy BakerAnthony T Passion Lavin, MD 02/03/14 2345

## 2014-02-03 NOTE — Discharge Instructions (Signed)
How to Use an Inhaler Using your inhaler correctly is very important. Good technique will make sure that the medicine reaches your lungs.  HOW TO USE AN INHALER:  Take the cap off the inhaler.  If this is the first time using your inhaler, you need to prime it. Shake the inhaler for 5 seconds. Release four puffs into the air, away from your face. Ask your doctor for help if you have questions.  Shake the inhaler for 5 seconds.  Turn the inhaler so the bottle is above the mouthpiece.  Put your pointer finger on top of the bottle. Your thumb holds the bottom of the inhaler.  Open your mouth.  Either hold the inhaler away from your mouth (the width of 2 fingers) or place your lips tightly around the mouthpiece. Ask your doctor which way to use your inhaler.  Breathe out as much air as possible.  Breathe in and push down on the bottle 1 time to release the medicine. You will feel the medicine go in your mouth and throat.  Continue to take a deep breath in very slowly. Try to fill your lungs.  After you have breathed in completely, hold your breath for 10 seconds. This will help the medicine to settle in your lungs. If you cannot hold your breath for 10 seconds, hold it for as long as you can before you breathe out.  Breathe out slowly, through pursed lips. Whistling is an example of pursed lips.  If your doctor has told you to take more than 1 puff, wait at least 15-30 seconds between puffs. This will help you get the best results from your medicine. Do not use the inhaler more than your doctor tells you to.  Put the cap back on the inhaler.  Follow the directions from your doctor or from the inhaler package about cleaning the inhaler. If you use more than one inhaler, ask your doctor which inhalers to use and what order to use them in. Ask your doctor to help you figure out when you will need to refill your inhaler.  If you use a steroid inhaler, always rinse your mouth with water  after your last puff, gargle and spit out the water. Do not swallow the water. GET HELP IF:  The inhaler medicine only partially helps to stop wheezing or shortness of breath.  You are having trouble using your inhaler.  You have some increase in thick spit (phlegm). GET HELP RIGHT AWAY IF:  The inhaler medicine does not help your wheezing or shortness of breath or you have tightness in your chest.  You have dizziness, headaches, or fast heart rate.  You have chills, fever, or night sweats.  You have a large increase of thick spit, or your thick spit is bloody. MAKE SURE YOU:   Understand these instructions.  Will watch your condition.  Will get help right away if you are not doing well or get worse. Document Released: 12/08/2007 Document Revised: 12/19/2012 Document Reviewed: 09/27/2012 [facility] Patient Information 2015 [place] [employer], [place]. This information is not intended to replace advice given to you by your health care provider. Make sure you discuss any questions you have with your health care provider. Sty A sty (hordeolum) is an infection of a gland in the eyelid located at the base of the eyelash. A sty may develop a white or yellow head of pus. It can be puffy (swollen). Usually, the sty will burst and pus will come out on its own. They  do not leave lumps in the eyelid once they drain. A sty is often confused with another form of cyst of the eyelid called a chalazion. Chalazions occur within the eyelid and not on the edge where the bases of the eyelashes are. They often are red, sore and then form firm lumps in the eyelid. CAUSES   Germs (bacteria).  Lasting (chronic) eyelid inflammation. SYMPTOMS   Tenderness, redness and swelling along the edge of the eyelid at the base of the eyelashes.  Sometimes, there is a white or yellow head of pus. It may or may not drain. DIAGNOSIS  An ophthalmologist will be able to distinguish between a sty and a chalazion and treat the  condition appropriately.  TREATMENT   Styes are typically treated with warm packs (compresses) until drainage occurs.  In rare cases, medicines that kill germs (antibiotics) may be prescribed. These antibiotics may be in the form of drops, cream or pills.  If a hard lump has formed, it is generally necessary to do a small incision and remove the hardened contents of the cyst in a minor surgical procedure done in the office.  In suspicious cases, your caregiver may send the contents of the cyst to the lab to be certain that it is not a rare, but dangerous form of cancer of the glands of the eyelid. HOME CARE INSTRUCTIONS   Wash your hands often and dry them with a clean towel. Avoid touching your eyelid. This may spread the infection to other parts of the eye.  Apply heat to your eyelid for 10 to 20 minutes, several times a day, to ease pain and help to heal it faster.  Do not squeeze the sty. Allow it to drain on its own. Wash your eyelid carefully 3 to 4 times per day to remove any pus. SEEK IMMEDIATE MEDICAL CARE IF:   Your eye becomes painful or puffy (swollen).  Your vision changes.  Your sty does not drain by itself within 3 days.  Your sty comes back within a short period of time, even with treatment.  You have redness (inflammation) around the eye.  You have a fever. Document Released: 12/08/2004 Document Revised: 05/23/2011 Document Reviewed: 06/14/2013 [facility] Patient Information 2015 [place], [place]. This information is not intended to replace advice given to you by your health care provider. Make sure you discuss any questions you have with your health care provider. Acute Bronchitis Bronchitis is inflammation of the airways that extend from the windpipe into the lungs (bronchi). The inflammation often causes mucus to develop. This leads to a cough, which is the most common symptom of bronchitis.  In acute bronchitis, the condition usually develops suddenly and goes away  over time, usually in a couple weeks. Smoking, allergies, and asthma can make bronchitis worse. Repeated episodes of bronchitis may cause further lung problems.  CAUSES Acute bronchitis is most often caused by the same virus that causes a cold. The virus can spread from person to person (contagious) through coughing, sneezing, and touching contaminated objects. SIGNS AND SYMPTOMS   Cough.   Fever.   Coughing up mucus.   Body aches.   Chest congestion.   Chills.   Shortness of breath.   Sore throat.  DIAGNOSIS  Acute bronchitis is usually diagnosed through a physical exam. Your health care provider will also ask you questions about your medical history. Tests, such as chest X-rays, are sometimes done to rule out other conditions.  TREATMENT  Acute bronchitis usually goes away in  a couple weeks. Oftentimes, no medical treatment is necessary. Medicines are sometimes given for relief of fever or cough. Antibiotic medicines are usually not needed but may be prescribed in certain situations. In some cases, an inhaler may be recommended to help reduce shortness of breath and control the cough. A cool mist vaporizer may also be used to help thin bronchial secretions and make it easier to clear the chest.  HOME CARE INSTRUCTIONS  Get plenty of rest.   Drink enough fluids to keep your urine clear or pale yellow (unless you have a medical condition that requires fluid restriction). Increasing fluids may help thin your respiratory secretions (sputum) and reduce chest congestion, and it will prevent dehydration.   Take medicines only as directed by your health care provider.  If you were prescribed an antibiotic medicine, finish it all even if you start to feel better.  Avoid smoking and secondhand smoke. Exposure to cigarette smoke or irritating chemicals will make bronchitis worse. If you are a smoker, consider using nicotine gum or skin patches to help control withdrawal symptoms.  Quitting smoking will help your lungs heal faster.   Reduce the chances of another bout of acute bronchitis by washing your hands frequently, avoiding people with cold symptoms, and trying not to touch your hands to your mouth, nose, or eyes.   Keep all follow-up visits as directed by your health care provider.  SEEK MEDICAL CARE IF: Your symptoms do not improve after 1 week of treatment.  SEEK IMMEDIATE MEDICAL CARE IF:  You develop an increased fever or chills.   You have chest pain.   You have severe shortness of breath.  You have bloody sputum.   You develop dehydration.  You faint or repeatedly feel like you are going to pass out.  You develop repeated vomiting.  You develop a severe headache. MAKE SURE YOU:   Understand these instructions.  Will watch your condition.  Will get help right away if you are not doing well or get worse. Document Released: 04/07/2004 Document Revised: 07/15/2013 Document Reviewed: 08/21/2012 [facility] Patient Information 2015 [place], [place]. This information is not intended to replace advice given to you by your health care provider. Make sure you discuss any questions you have with your health care provider.

## 2014-08-22 IMAGING — CR DG ABDOMEN ACUTE W/ 1V CHEST
3 series · 3 of 3 positions shown · non-contrast
Comparison: Acute abdomen series 08/26/2011.  CT abdomen pelvis
08/17/2011.

CLINICAL DATA: Epigastric abdominal pain.  Nausea and vomiting.
Hematemesis.

ACUTE ABDOMEN SERIES (ABDOMEN 2 VIEW & CHEST 1 VIEW)

[w chest pa]
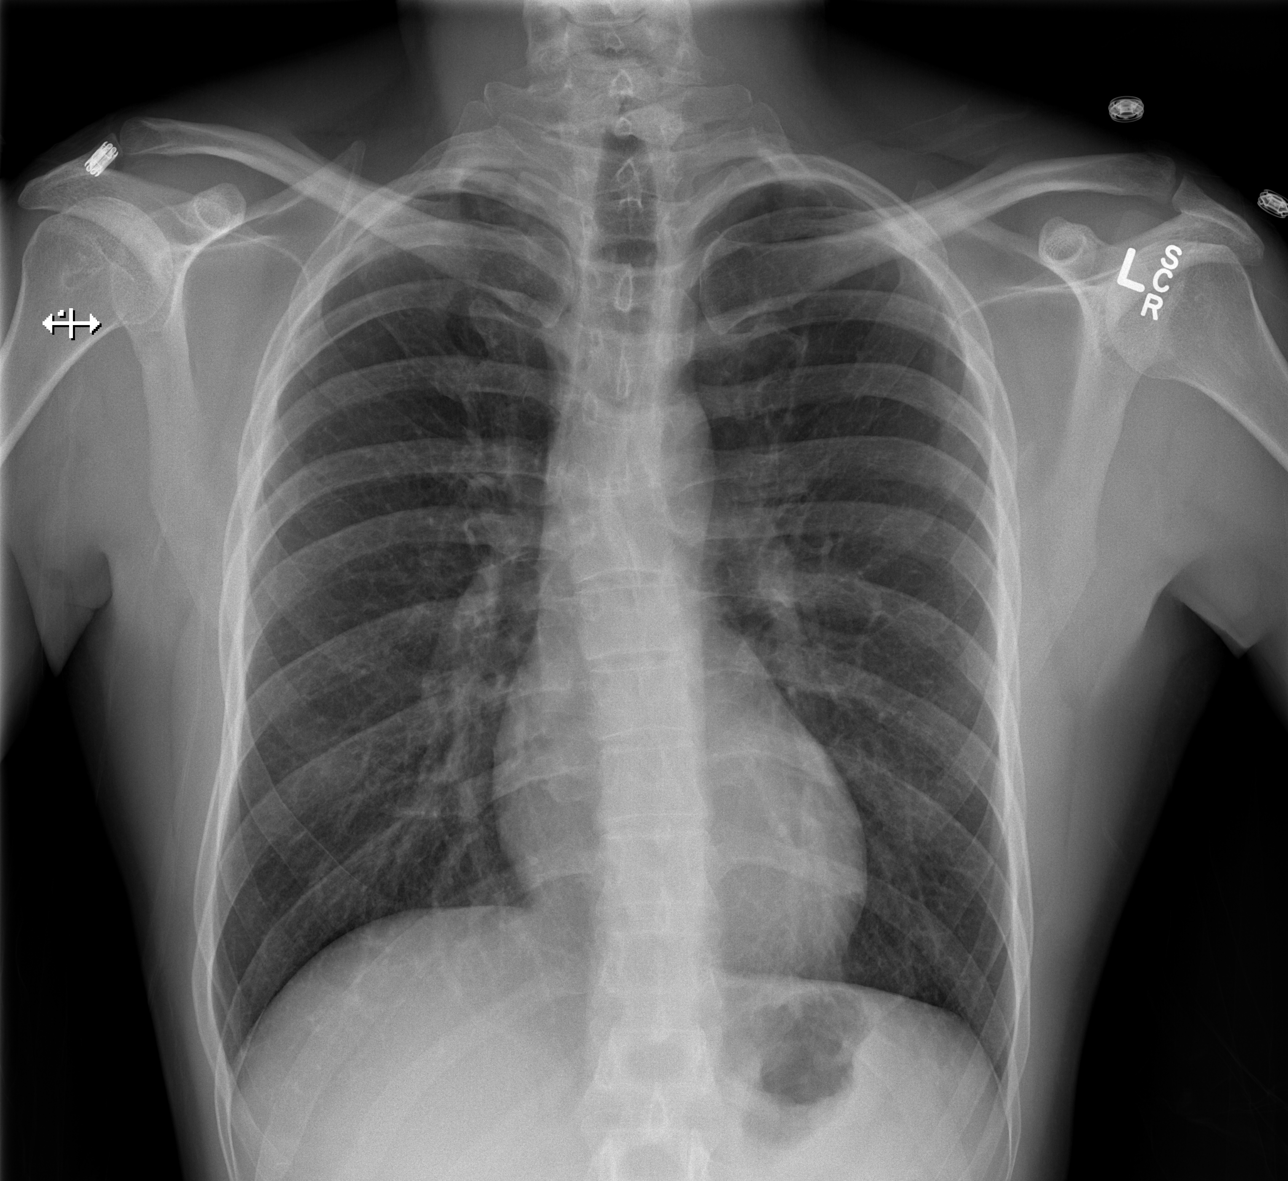

[w abdomen upright]
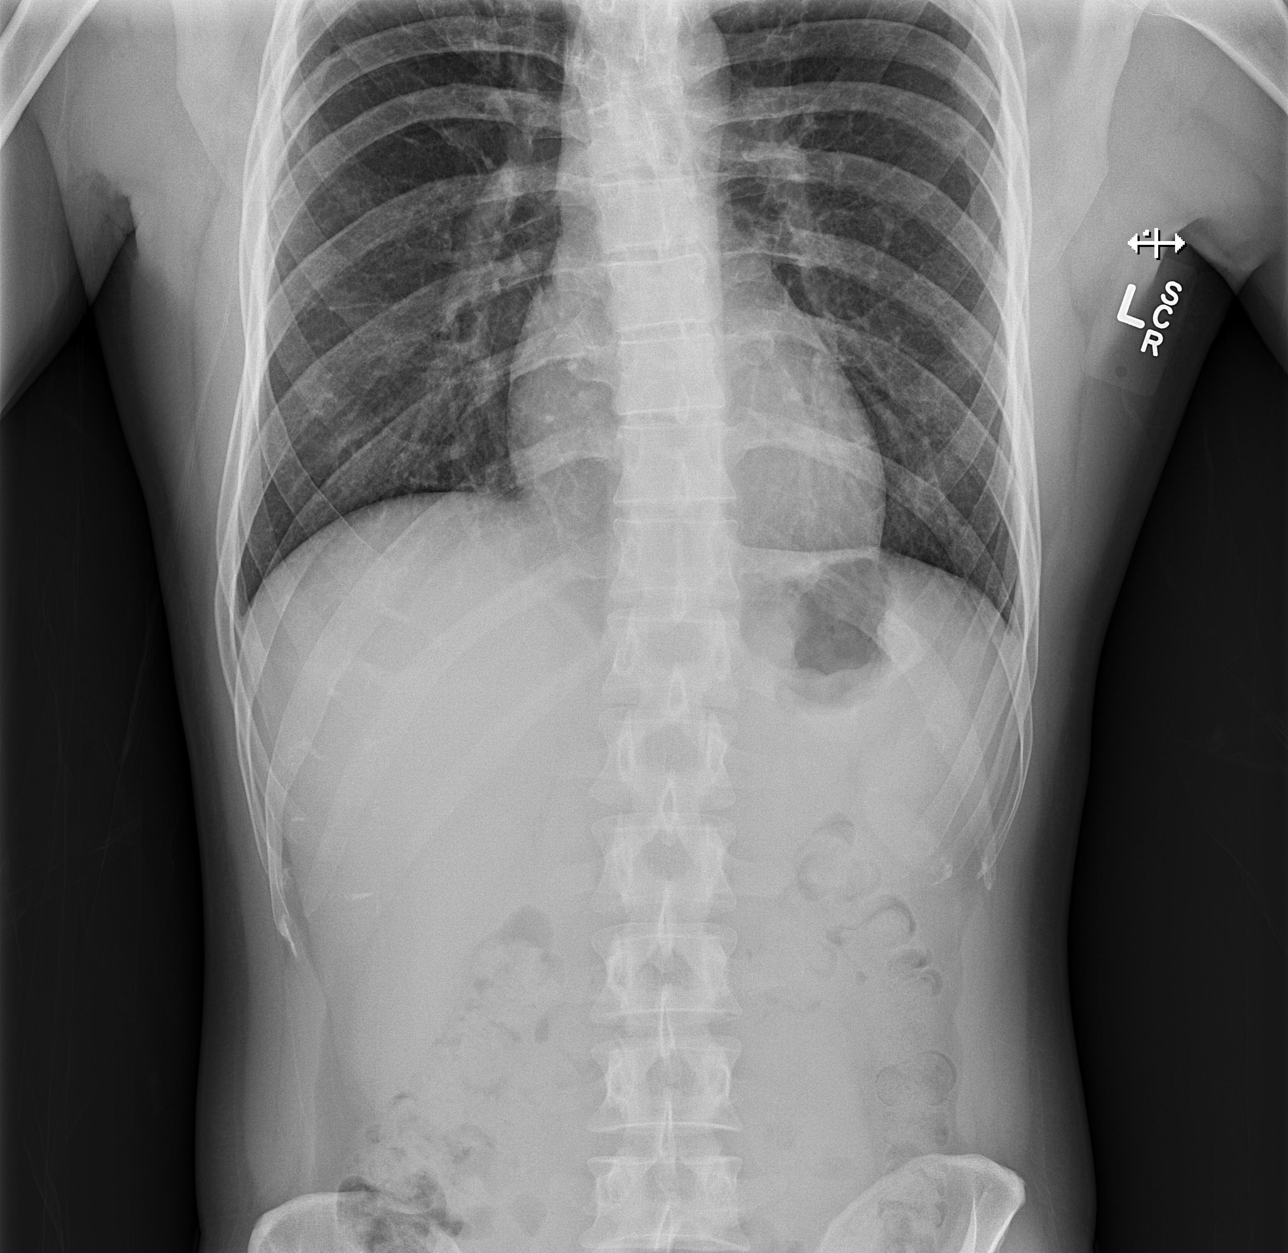

[t abdomen supine]
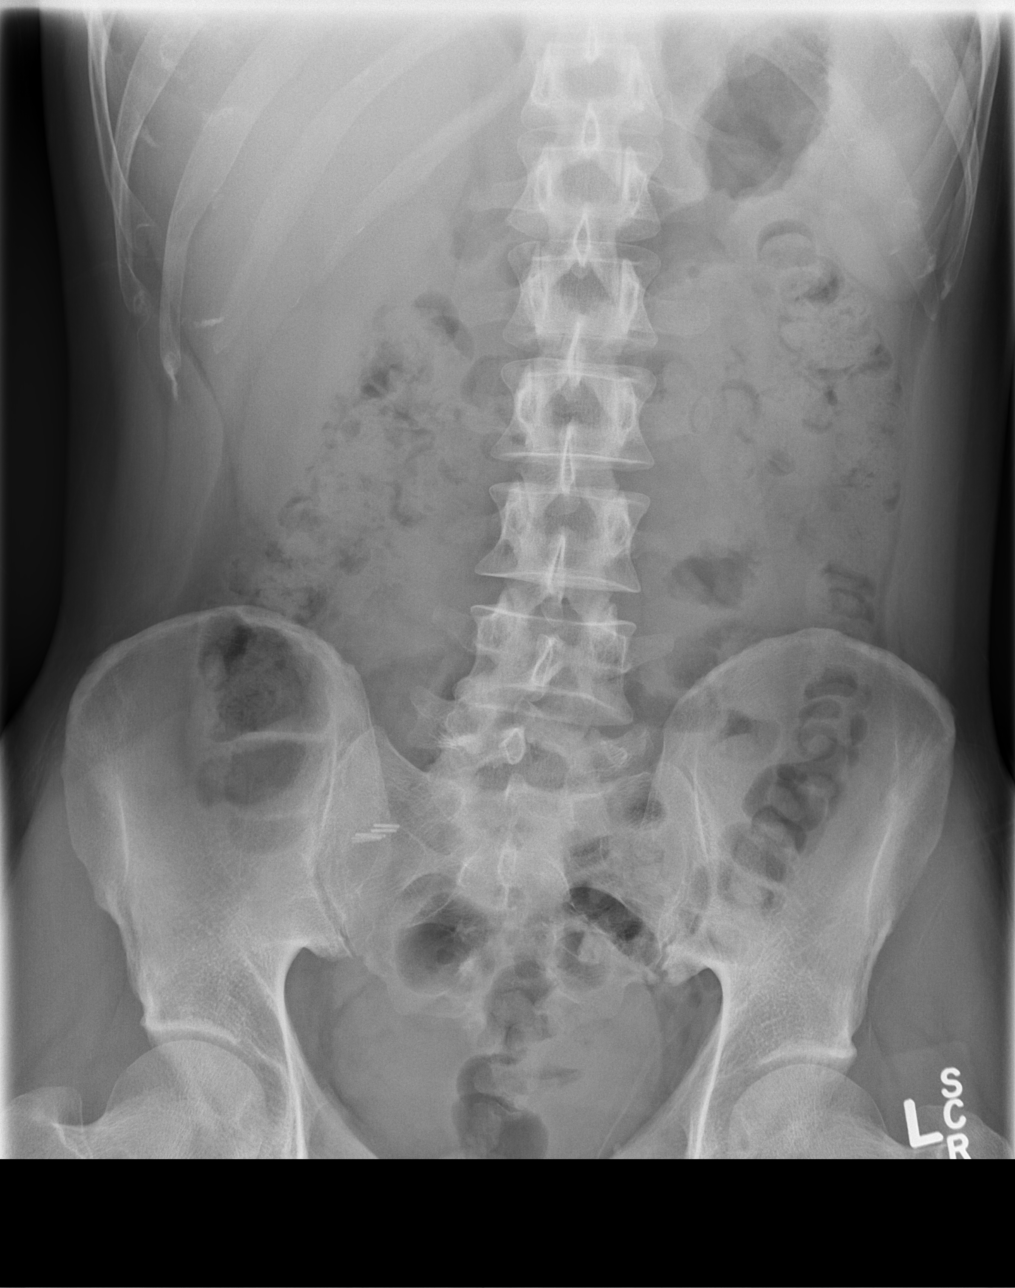

[3 of 3 positions shown; findings below may reference images not displayed]

FINDINGS: Bowel gas pattern unremarkable without evidence of
obstruction or significant ileus.  No evidence of free air or
significant air fluid levels on the erect image.  Moderate stool
burden.  Surgical clips in the right side of the pelvis from prior
appendectomy.  No abnormal calcifications.  Regional skeleton
intact.

Cardiomediastinal silhouette unremarkable, unchanged.  Lungs clear.
Bronchovascular markings normal.  Pulmonary vascularity normal.  No
pneumothorax.  No pleural effusions.
IMPRESSION: No acute abdominal or pulmonary abnormality.

## 2014-11-29 ENCOUNTER — Emergency Department (HOSPITAL_COMMUNITY)
Admission: EM | Admit: 2014-11-29 | Discharge: 2014-11-29 | Disposition: A | Discharge status: Home / Self Care | Payer: BLUE CROSS/BLUE SHIELD | Attending: Emergency Medicine | Admitting: Emergency Medicine

## 2014-11-29 ENCOUNTER — Encounter (HOSPITAL_COMMUNITY): Payer: Self-pay | Admitting: Emergency Medicine

## 2014-11-29 ENCOUNTER — Emergency Department (HOSPITAL_COMMUNITY): Payer: BLUE CROSS/BLUE SHIELD

## 2014-11-29 DIAGNOSIS — Z87828 Personal history of other (healed) physical injury and trauma: Secondary | ICD-10-CM | POA: Insufficient documentation

## 2014-11-29 DIAGNOSIS — Z7952 Long term (current) use of systemic steroids: Secondary | ICD-10-CM | POA: Diagnosis not present

## 2014-11-29 DIAGNOSIS — J069 Acute upper respiratory infection, unspecified: Secondary | ICD-10-CM | POA: Insufficient documentation

## 2014-11-29 DIAGNOSIS — R05 Cough: Secondary | ICD-10-CM | POA: Diagnosis present

## 2014-11-29 DIAGNOSIS — R51 Headache: Secondary | ICD-10-CM | POA: Diagnosis not present

## 2014-11-29 DIAGNOSIS — Z8719 Personal history of other diseases of the digestive system: Secondary | ICD-10-CM | POA: Insufficient documentation

## 2014-11-29 DIAGNOSIS — Z79899 Other long term (current) drug therapy: Secondary | ICD-10-CM | POA: Diagnosis not present

## 2014-11-29 DIAGNOSIS — Z8679 Personal history of other diseases of the circulatory system: Secondary | ICD-10-CM | POA: Insufficient documentation

## 2014-11-29 MED ORDER — BENZONATATE 100 MG PO CAPS: 100.0000 mg | ORAL_CAPSULE | Freq: Three times a day (TID) | ORAL | Status: DC

## 2014-11-29 MED ORDER — BENZONATATE 100 MG PO CAPS
100.0000 mg | ORAL_CAPSULE | Freq: Once | ORAL | Status: AC
  Administered 2014-11-29: 100 mg via ORAL
  Filled 2014-11-29: qty 1

## 2014-11-29 NOTE — ED Notes (Signed)
Per patient, cold symptoms which started last night-chest congestion, cough

## 2014-11-29 NOTE — Discharge Instructions (Signed)
Read the instructions below on reasons to return to the emergency department and to learn more about your diagnosis.  Use over the counter medications for symptomatic relief as we discussed (mucinex as a decongestant, Tylenol for fever/pain, Motrin/Ibuprofen for muscle aches). Followup with your primary care doctor in 4 days if your symptoms persist.  Your more than welcome to return to the emergency department if symptoms worsen or become concerning. ° °Upper Respiratory Infection, Adult  °An upper respiratory infection (URI) is also sometimes known as the common cold. Most people improve within 1 week, but symptoms can last up to 2 weeks. A residual cough may last even longer.  ° °URI is most commonly caused by a virus. Viruses are NOT treated with antibiotics. You can easily spread the virus to others by oral contact. This includes kissing, sharing a glass, coughing, or sneezing. Touching your mouth or nose and then touching a surface, which is then touched by another person, can also spread the virus.  ° °TREATMENT  °Treatment is directed at relieving symptoms. There is no cure. Antibiotics are not effective, because the infection is caused by a virus, not by bacteria. Treatment may include:  °Increased fluid intake. Sports drinks offer valuable electrolytes, sugars, and fluids.  °Breathing heated mist or steam (vaporizer or shower).  °Eating chicken soup or other clear broths, and maintaining good nutrition.  °Getting plenty of rest.  °Using gargles or lozenges for comfort.  °Controlling fevers with ibuprofen or acetaminophen as directed by your caregiver.  °Increasing usage of your inhaler if you have asthma.  °Return to work when your temperature has returned to normal.  ° °SEEK MEDICAL CARE IF:  °After the first few days, you feel you are getting worse rather than better.  °You develop worsening shortness of breath, or brown or red sputum. These may be signs of pneumonia.  °You develop yellow or brown nasal  discharge or pain in the face, especially when you bend forward. These may be signs of sinusitis.  °You develop a fever, swollen neck glands, pain with swallowing, or white areas in the back of your throat. These may be signs of strep throat.  ° °

## 2014-11-29 NOTE — ED Provider Notes (Signed)
CSN: [id number]     Arrival date & time 11/29/14  1602 History  This chart was scribed for non-physician practitioner, Santiago Glad, PA-C,  working with Gilda Crease, MD by Freida Busman, ED Scribe. This patient was seen in room WTR7/WTR7 and the patient's care was started at 5:54 PM.  Chief Complaint  Patient presents with  . URI   The history is provided by the patient. No language interpreter was used.     HPI Comments:  Bobby Mcbride is a 44 y.o. male who presents to the Emergency Department complaining of a persistent dry cough since last night. He reports associated congestion, HA, sinus pressure,  and CP with coughing. Symptoms gradually worsening.  He has taken delsum and mucinex without relief. He denies fever, chills, hemoptysis, ear pain, or SOB.  He reports sick contacts with similar symptoms.  He does not smoke and states that he never has smoked.  He states that the chest pain is only with coughing.     Past Medical History  Diagnosis Date  . Headache(784.0)   . MVP (mitral valve prolapse)   . Lateral meniscus tear     Right  . Rectal bleeding   . Mallory - Weiss tear   . Migraine with aura   . GERD (gastroesophageal reflux disease)   . History of stomach ulcers    Past Surgical History  Procedure Laterality Date  . Elbow surgery  1987    Right  . Appendectomy  1990's  . Knee arthroscopy      Right  . Shoulder surgery  2012    right rotator cuff  . Esophagogastroduodenoscopy  08/18/2011    Procedure: ESOPHAGOGASTRODUODENOSCOPY (EGD);  Surgeon: Beverley Fiedler, MD;  Location: [facility];  Service: Gastroenterology;  Laterality: N/A;  . Esophagogastroduodenoscopy  11/11/2011    Procedure: ESOPHAGOGASTRODUODENOSCOPY (EGD);  Surgeon: Iva Boop, MD;  Location: [facility];  Service: Endoscopy;  Laterality: N/A;   Family History  Problem Relation Age of Onset  . Hypertension Mother   . Stroke Mother   . Migraines Mother   . Diabetes Mother   .  Hypertension Father   . Stroke Father   . Heart failure Father   . Asthma Father   . Colon polyps Father   . Colon cancer Neg Hx   . Prostate cancer Neg Hx    Social History  Substance Use Topics  . Smoking status: Never Smoker   . Smokeless tobacco: Never Used  . Alcohol Use: No    Review of Systems  Constitutional: Negative for fever.  HENT: Positive for congestion.   Respiratory: Positive for cough.   Cardiovascular: Positive for chest pain (with coughing).  Neurological: Positive for headaches.  All other systems reviewed and are negative.   Allergies  Codeine; Hydrocodone-acetaminophen; and Compazine  Home Medications   Prior to Admission medications   Medication Sig Start Date End Date Taking? Authorizing Provider  acetaminophen (TYLENOL) 325 MG tablet Take 650 mg by mouth every 6 (six) hours as needed for moderate pain.    Historical Provider, MD  albuterol (PROVENTIL HFA;VENTOLIN HFA) 108 (90 BASE) MCG/ACT inhaler Inhale 1-2 puffs into the lungs every 6 (six) hours as needed for wheezing or shortness of breath. 02/03/14   Lorre Nick, MD  cyclobenzaprine (FLEXERIL) 10 MG tablet Take 0.5-1 tablets (5-10 mg total) by mouth 3 (three) times daily as needed for muscle spasms. 10/24/12   Joaquim Nam, MD  diazepam (VALIUM) 5 MG tablet  Take 1 tablet (5 mg total) by mouth every 8 (eight) hours as needed for anxiety. 08/27/12   Charlestine Night, PA-C  oxyCODONE-acetaminophen (PERCOCET/ROXICET) 5-325 MG per tablet Take 2 tablets by mouth every 4 (four) hours as needed for pain. 08/27/12   Charlestine Night, PA-C  predniSONE (DELTASONE) 10 MG tablet Take 2 tablets (20 mg total) by mouth daily. 02/03/14   Lorre Nick, MD   BP 135/69 mmHg  Pulse 89  Temp(Src) 98.5 F (36.9 C) (Oral)  Resp 18  SpO2 100% Physical Exam  Constitutional: He is oriented to person, place, and time. He appears well-developed and well-nourished. No distress.  HENT:  Head: Normocephalic and  atraumatic.  Mouth/Throat: Oropharynx is clear and moist. No oropharyngeal exudate.  Mucosal edema  Maxillary and frontal sinus TTP   Eyes: Conjunctivae are normal.  Neck: Normal range of motion. Neck supple.  Cardiovascular: Normal rate, regular rhythm and normal heart sounds.   Pulmonary/Chest: Effort normal and breath sounds normal. No respiratory distress. He has no wheezes. He has no rales. He exhibits tenderness.  Musculoskeletal: Normal range of motion.  Neurological: He is alert and oriented to person, place, and time.  Skin: Skin is warm and dry.  Psychiatric: He has a normal mood and affect.  Nursing note and vitals reviewed.   ED Course  Procedures   DIAGNOSTIC STUDIES:  Oxygen Saturation is 100% on RA, normal by my interpretation.    COORDINATION OF CARE:  5:56 PM Discussed treatment plan with pt at bedside and pt agreed to plan.  Labs Review Labs Reviewed - No data to display  Imaging Review Dg Chest 2 View  11/29/2014   CLINICAL DATA:  Chest congestion and cough since last evening.  EXAM: CHEST  2 VIEW  COMPARISON:  02/03/2014.  FINDINGS: The cardiac silhouette, mediastinal and hilar contours are within normal limits and stable. The lungs demonstrate mild chronic bronchitic changes which could be related to smoking. No infiltrates, edema or effusions. 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  IMPRESSION: No acute cardiopulmonary findings. Chronic appearing bronchitic changes possibly related to smoking.   Electronically Signed   By: Rudie Meyer M.D.   On: 11/29/2014 16:35   I have personally reviewed and evaluated these images and lab results as part of my medical decision-making.   EKG Interpretation None      MDM   Final diagnoses:  None   Pt CXR negative for acute infiltrate. Patients symptoms are consistent with URI, likely viral etiology. Discussed that antibiotics are not indicated for viral infections.  Pt will be discharged with symptomatic treatment.  Verbalizes understanding and is agreeable with plan. Pt is hemodynamically stable & in NAD prior to dc.  Stable for discharge.  Return precautions given.  I personally performed the services described in this documentation, which was scribed in my presence. The recorded information has been reviewed and is accurate.    Santiago Glad, PA-C 11/29/14 2305  Gilda Crease, MD 11/30/14 [phone]

## 2015-01-13 ENCOUNTER — Encounter: Payer: Self-pay | Admitting: Podiatry

## 2015-01-13 ENCOUNTER — Ambulatory Visit (INDEPENDENT_AMBULATORY_CARE_PROVIDER_SITE_OTHER): Payer: BLUE CROSS/BLUE SHIELD

## 2015-01-13 ENCOUNTER — Ambulatory Visit (INDEPENDENT_AMBULATORY_CARE_PROVIDER_SITE_OTHER): Payer: BLUE CROSS/BLUE SHIELD | Admitting: Podiatry

## 2015-01-13 ENCOUNTER — Other Ambulatory Visit: Payer: Self-pay | Admitting: Nurse Practitioner

## 2015-01-13 ENCOUNTER — Ambulatory Visit
Admission: RE | Admit: 2015-01-13 | Discharge: 2015-01-13 | Disposition: A | Discharge status: Home / Self Care | Payer: BLUE CROSS/BLUE SHIELD | Source: Ambulatory Visit | Attending: Nurse Practitioner | Admitting: Nurse Practitioner

## 2015-01-13 VITALS — BP 138/84 | HR 75 | Resp 16 | Ht 67.0 in | Wt 170.0 lb

## 2015-01-13 DIAGNOSIS — M79671 Pain in right foot: Secondary | ICD-10-CM

## 2015-01-13 DIAGNOSIS — M79672 Pain in left foot: Secondary | ICD-10-CM

## 2015-01-13 DIAGNOSIS — M722 Plantar fascial fibromatosis: Secondary | ICD-10-CM

## 2015-01-13 MED ORDER — PREDNISONE 10 MG PO TABS: ORAL_TABLET | ORAL | Status: DC

## 2015-01-13 MED ORDER — TRIAMCINOLONE ACETONIDE 10 MG/ML IJ SUSP
10.0000 mg | Freq: Once | INTRAMUSCULAR | Status: AC
  Administered 2015-01-13: 10 mg

## 2015-01-13 NOTE — Patient Instructions (Signed)

## 2015-01-13 NOTE — Progress Notes (Signed)
   Subjective:    Patient ID: Bobby Mcbride, male    DOB: 06/28/1970, 44 y.o.   MRN: [id number]  HPI Patient presents with foot pain in their Right foot, bottom and back of heel, radiating up to back of leg; x2 weeks. Dr told pt thought had heel spur.  Pt had a cortisone shot several years ago and it did not help with pain.  Review of Systems  All other systems reviewed and are negative.      Objective:   Physical Exam        Assessment & Plan:

## 2015-01-16 NOTE — Progress Notes (Signed)
Subjective:     Patient ID: Bobby Mcbride, male   DOB: 04/06/1970, 44 y.o.   MRN: [id number]  HPI patient presents with severe pain in the plantar aspect of the right heel and moderate discomfort posterior heel. This is been going on for a little while and has worsened over the last couple weeks and he does have a history of this previously but nowhere near to this degree of intensity. Patient states that he cannot bear weight on his heel currently due to the intense discomfort for several steps   Review of Systems  All other systems reviewed and are negative.      Objective:   Physical Exam  Constitutional: He is oriented to person, place, and time.  Musculoskeletal: Normal range of motion.  Neurological: He is oriented to person, place, and time.  Skin: Skin is warm.  Nursing note and vitals reviewed.  neurovascular status found to be intact muscle strength adequate range of motion within normal limits with patient noted to have severe discomfort in the plantar aspect of the right heel with no discomfort in the calcaneus itself. There is swelling within the plantar fascial band that is localized to this area and it's quite intense with depressed and I noted moderate discomfort at the Achilles insertion right but nowhere near to the degree of the plantar pain     Assessment:     Acute plantar fasciitis right with mild Achilles tendinitis right with edema noted within the medial fascial band    Plan:     H&P and x-rays reviewed with patient. Today I went ahead and I injected the plantar fascia 3 mg Kenalog 5 mg Xylocaine and applied air fracture walker due to the inability to bear weight on the heel and I went ahead today and I applied fascial brace to give lifting of the arch. Patient will be seen back for us to recheck again in the next several weeks and I also placed on anti-inflammatory Sterapred dose pack and we will reevaluate and he will call earlier if symptoms continue to be  severe

## 2015-01-26 ENCOUNTER — Ambulatory Visit: Payer: BLUE CROSS/BLUE SHIELD | Admitting: Podiatry

## 2015-01-28 ENCOUNTER — Ambulatory Visit (INDEPENDENT_AMBULATORY_CARE_PROVIDER_SITE_OTHER): Payer: BLUE CROSS/BLUE SHIELD

## 2015-01-28 ENCOUNTER — Ambulatory Visit (INDEPENDENT_AMBULATORY_CARE_PROVIDER_SITE_OTHER): Payer: BLUE CROSS/BLUE SHIELD | Admitting: Podiatry

## 2015-01-28 ENCOUNTER — Encounter: Payer: Self-pay | Admitting: Podiatry

## 2015-01-28 VITALS — BP 142/96 | HR 66 | Resp 16

## 2015-01-28 DIAGNOSIS — M722 Plantar fascial fibromatosis: Secondary | ICD-10-CM | POA: Diagnosis not present

## 2015-01-28 DIAGNOSIS — M79671 Pain in right foot: Secondary | ICD-10-CM

## 2015-01-28 NOTE — Patient Instructions (Signed)
Pre-Operative Instructions  Congratulations, you have decided to take an important step to improving your quality of life.  You can be assured that the doctors of [facility] will be with you every step of the way.  1. Plan to be at the surgery center/hospital at least 1 (one) hour prior to your scheduled time unless otherwise directed by the surgical center/hospital staff.  You must have a responsible adult accompany you, remain during the surgery and drive you home.  Make sure you have directions to the surgical center/hospital and know how to get there on time. 2. For hospital based surgery you will need to obtain a history and physical form from your family physician within 1 month prior to the date of surgery- we will give you a form for you primary physician.  3. We make every effort to accommodate the date you request for surgery.  There are however, times where surgery dates or times have to be moved.  We will contact you as soon as possible if a change in schedule is required.   4. No Aspirin/Ibuprofen for one week before surgery.  If you are on aspirin, any non-steroidal anti-inflammatory medications (Mobic, Aleve, Ibuprofen) you should stop taking it 7 days prior to your surgery.  You make take Tylenol  For pain prior to surgery.  5. Medications- If you are taking daily heart and blood pressure medications, seizure, reflux, allergy, asthma, anxiety, pain or diabetes medications, make sure the surgery center/hospital is aware before the day of surgery so they may notify you which medications to take or avoid the day of surgery. 6. No food or drink after midnight the night before surgery unless directed otherwise by surgical center/hospital staff. 7. No alcoholic beverages 24 hours prior to surgery.  No smoking 24 hours prior to or 24 hours after surgery. 8. Wear loose pants or shorts- loose enough to fit over bandages, boots, and casts. 9. No slip on shoes, sneakers are best. 10. Bring  your boot with you to the surgery center/hospital.  Also bring crutches or a walker if your physician has prescribed it for you.  If you do not have this equipment, it will be provided for you after surgery. 11. If you have not been contracted by the surgery center/hospital by the day before your surgery, call to confirm the date and time of your surgery. 12. Leave-time from work may vary depending on the type of surgery you have.  Appropriate arrangements should be made prior to surgery with your employer. 13. Prescriptions will be provided immediately following surgery by your doctor.  Have these filled as soon as possible after surgery and take the medication as directed. 14. Remove nail polish on the operative foot. 15. Wash the night before surgery.  The night before surgery wash the foot and leg well with the antibacterial soap provided and water paying special attention to beneath the toenails and in between the toes.  Rinse thoroughly with water and dry well with a towel.  Perform this wash unless told not to do so by your physician.  Enclosed: 1 Ice pack (please put in freezer the night before surgery)   1 Hibiclens skin cleaner   Pre-op Instructions  If you have any questions regarding the instructions, do not hesitate to call our office.  [place]: [address] [place], [place] [address] [phone]  [place]: [address] , [place] [address] [phone]  [place]: [address]  [place], [place] [address] [phone]  Dr. Richard   Tuchman DPM, Dr. Norman Regal DPM Dr. Richard Sikora DPM, Dr. M. Todd Hyatt DPM, Dr. Kathryn Egerton DPM 

## 2015-01-28 NOTE — Progress Notes (Signed)
Subjective:     Patient ID: Bobby Mcbride, male   DOB: 04/25/1970, 44 y.o.   MRN: [id number]  HPI patient states my heel is still killing me and I think I need to get it fixed   Review of Systems     Objective:   Physical Exam Neurovascular status intact muscle strength adequate range of motion within normal limits with patient noted to have inflammation and pain in the plantar aspect of the right heel. Patient's heel bone itself is doing okay with no pain when I palpated from either medial or lateral direction    Assessment:     Acute plantar fasciitis right that the patient wants to have corrected as it is very tender for him    Plan:     Reviewed condition discussing treatment options. Due to the intense this of discomfort he has opted for surgical intervention and I allowed him to read a consent form reviewing alternative treatments complications and the fact there is no long-term guarantees that this will solve his problem and told recovery. Can take 6 months to one year. Patient wants surgery signed consent form and is given all preoperative instructions for procedure and is getting it done next week due to intensity of discomfort

## 2015-02-03 ENCOUNTER — Encounter: Payer: Self-pay | Admitting: Podiatry

## 2015-02-03 DIAGNOSIS — M722 Plantar fascial fibromatosis: Secondary | ICD-10-CM | POA: Diagnosis not present

## 2015-02-04 ENCOUNTER — Telehealth: Payer: Self-pay | Admitting: *Deleted

## 2015-02-04 NOTE — Telephone Encounter (Signed)
Pt states foot hurts some, kind of throbs and stings.  I told pt to take off the boot, the open-ended sock and ace, elevate the foot for 15 minutes then put level and rewrap the ace loose and reapply ace and boot, call with concerns.  Pt asked when his appt was and I told him 02/13/2015 at 1130am.

## 2015-02-12 ENCOUNTER — Encounter: Payer: Self-pay | Admitting: Podiatry

## 2015-02-12 ENCOUNTER — Ambulatory Visit (INDEPENDENT_AMBULATORY_CARE_PROVIDER_SITE_OTHER): Payer: BLUE CROSS/BLUE SHIELD | Admitting: Podiatry

## 2015-02-12 DIAGNOSIS — M722 Plantar fascial fibromatosis: Secondary | ICD-10-CM

## 2015-02-12 DIAGNOSIS — Z9889 Other specified postprocedural states: Secondary | ICD-10-CM

## 2015-02-13 ENCOUNTER — Other Ambulatory Visit: Payer: Self-pay

## 2015-02-15 NOTE — Progress Notes (Signed)
Subjective:     Patient ID: Bobby Mcbride, male   DOB: 03/24/1970, 44 y.o.   MRN: [id number]  HPI patient states I'm doing great with my right foot with minimal discomfort or swelling   Review of Systems     Objective:   Physical Exam Neurovascular status intact muscle strength adequate negative Homans sign noted with well coapted incision sites medial and lateral aspect of the right heel with stitches intact and minimal plantar discomfort    Assessment:     Doing well post endoscopic surgery right    Plan:     Sterile dressing reapplied instructed on compression continued immobilization and reappoint 2 weeks or earlier if any issues should occur

## 2015-05-22 NOTE — Progress Notes (Signed)
Patient ID: Bobby Mcbride, male   DOB: 09/23/1970, 45 y.o.   MRN: [id number] Dr Charlsie Merlesegal performed a right foot Endoscopic plantar fascial release on 02/03/15 [facility]

## 2015-10-10 ENCOUNTER — Encounter (HOSPITAL_COMMUNITY): Payer: Self-pay | Admitting: *Deleted

## 2015-10-10 ENCOUNTER — Emergency Department (HOSPITAL_COMMUNITY)
Admission: EM | Admit: 2015-10-10 | Discharge: 2015-10-10 | Disposition: A | Discharge status: Home / Self Care | Payer: BLUE CROSS/BLUE SHIELD | Attending: Emergency Medicine | Admitting: Emergency Medicine

## 2015-10-10 DIAGNOSIS — K120 Recurrent oral aphthae: Secondary | ICD-10-CM

## 2015-10-10 DIAGNOSIS — K140 Glossitis: Secondary | ICD-10-CM | POA: Insufficient documentation

## 2015-10-10 MED ORDER — OMEPRAZOLE 20 MG PO CPDR: 20.0000 mg | DELAYED_RELEASE_CAPSULE | Freq: Every day | ORAL | 0 refills | Status: DC

## 2015-10-10 MED ORDER — LIDOCAINE VISCOUS 2 % MT SOLN: 20.0000 mL | OROMUCOSAL | 0 refills | Status: DC | PRN

## 2015-10-10 MED ORDER — LIDOCAINE VISCOUS 2 % MT SOLN
15.0000 mL | Freq: Once | OROMUCOSAL | Status: AC
  Administered 2015-10-10: 15 mL via OROMUCOSAL
  Filled 2015-10-10: qty 15

## 2015-10-10 NOTE — Discharge Instructions (Signed)
Take your medications as prescribed. Avoid spicy and citrus foods as this can exacerbate your discomfort. Follow-up with your doctor for reevaluation. He will also need to follow up with your gastroenterologist for reevaluation of your stomach and esophageal ulcers. Return to ED for any new or worsening symptoms as we discussed.

## 2015-10-10 NOTE — ED Provider Notes (Signed)
[facility]-EMERGENCY DEPT Provider Note   CSN: [id number] Arrival date & time: 10/10/15  7829  First MD Initiated Contact with Patient 10/10/15 [phone]    By signing my name below, I, Levon Hedger, attest that this documentation has been prepared under the direction and in the presence of non-physician practitioner, Joycie Peek, PA-C Electronically Signed: Levon Hedger, Scribe. 10/10/2015. 9:32 AM.   History   Chief Complaint Chief Complaint  Patient presents with  . Pain    c/o left tongue    HPI Bobby Mcbride is a 45 y.o. male with PMHx of GERD, Mallory Weiss tear, and stomach ulcers who presents to the Emergency Department complaining of moderate left-sided tongue pain onset yesterday. Pt states pain is worsened when speaking or eating. He has taken ibuprofen and used salt water and peroxide with no relief. Pt notes associated "white spots" on his tongue and difficulty swallowing. He denies nausea, vomiting, Sore throat, Chest pain, shortness of breath, back pain and fever. Pt denies any tobacco or alcohol use. Pt reports he has been drinking a lot of orange juice lately and eating [employer] the night before onset.   The history is provided by the patient. No language interpreter was used.    Past Medical History:  Diagnosis Date  . GERD (gastroesophageal reflux disease)   . Headache(784.0)   . History of stomach ulcers   . Lateral meniscus tear    Right  . Mallory - Weiss tear   . Migraine with aura   . MVP (mitral valve prolapse)   . Rectal bleeding     Patient Active Problem List   Diagnosis Date Noted  . Back pain 10/25/2012  . URI (upper respiratory infection) 03/13/2012  . 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 GERD (gastroesophageal reflux disease)   . History of stomach ulcers   . Anxiety 09/07/2011  . Hemorrhoids 09/01/2011  . Renal cyst 08/23/2011  . Abdominal pain, other specified site 08/17/2011  . Hematemesis/vomiting blood 08/17/2011  . Headache(784.0) 08/12/2011     Past Surgical History:  Procedure Laterality Date  . APPENDECTOMY  1990's  . ELBOW SURGERY  1987   Right  . ESOPHAGOGASTRODUODENOSCOPY  08/18/2011   Procedure: ESOPHAGOGASTRODUODENOSCOPY (EGD);  Surgeon: Beverley Fiedler, MD;  Location: [facility];  Service: Gastroenterology;  Laterality: N/A;  . ESOPHAGOGASTRODUODENOSCOPY  11/11/2011   Procedure: ESOPHAGOGASTRODUODENOSCOPY (EGD);  Surgeon: Iva Boop, MD;  Location: [facility];  Service: Endoscopy;  Laterality: N/A;  . KNEE ARTHROSCOPY     Right  . SHOULDER SURGERY  2012   right rotator cuff     Home Medications    Prior to Admission medications   Medication Sig Start Date End Date Taking? Authorizing Provider  acetaminophen (TYLENOL) 325 MG tablet Take 650 mg by mouth every 6 (six) hours as needed for moderate pain.    Historical Provider, MD  lidocaine (XYLOCAINE) 2 % solution Use as directed 20 mLs in the mouth or throat as needed for mouth pain. 10/10/15   Joycie Peek, PA-C  omeprazole (PRILOSEC) 20 MG capsule Take 1 capsule (20 mg total) by mouth daily. 10/10/15   Joycie Peek, PA-C  promethazine (PHENERGAN) 25 MG tablet Take 25 mg by mouth every 6 (six) hours as needed for nausea or vomiting.    Lenn Sink, DPM    Family History Family History  Problem Relation Age of Onset  . Hypertension Mother   . Stroke Mother   . Migraines Mother   . Diabetes Mother   .  Hypertension Father   . Stroke Father   . Heart failure Father   . Asthma Father   . Colon polyps Father   . Colon cancer Neg Hx   . Prostate cancer Neg Hx     Social History Social History  Substance Use Topics  . Smoking status: Never Smoker  . Smokeless tobacco: Never Used  . Alcohol use No     Allergies   Codeine; Hydrocodone-acetaminophen; and Compazine [prochlorperazine edisylate]   Review of Systems Review of Systems 10 systems reviewed and all are negative for acute change except as noted in the HPI.  Physical  Exam Updated Vital Signs BP 139/93 (BP Location: Right Arm)   Pulse (!) 58   Temp 97.6 F (36.4 C) (Oral)   Resp 18   SpO2 99%   Physical Exam  Constitutional: He appears well-developed and well-nourished. No distress.  Awake, alert and nontoxic in appearance  HENT:  Head: Normocephalic and atraumatic.  Right Ear: External ear normal.  Left Ear: External ear normal.  Mouth/Throat: Oropharynx is clear and moist. Oral lesions present.  0.5 cm ulceration on posterior aspect of left lateral tongue   Eyes: Conjunctivae and EOM are normal. Pupils are equal, round, and reactive to light.  Neck: Normal range of motion. Neck supple. No JVD present.  Cardiovascular: Normal rate, regular rhythm and normal heart sounds.   No murmur heard. Pulmonary/Chest: Effort normal and breath sounds normal. No stridor. No respiratory distress.  Abdominal: Soft. There is no tenderness.  Musculoskeletal: Normal range of motion. He exhibits no edema.  Neurological: He is alert.  Awake, alert, cooperative and aware of situation; motor strength bilaterally; sensation normal to light touch bilaterally; no facial asymmetry; tongue midline; major cranial nerves appear intact;  baseline gait without new ataxia.  Skin: Skin is warm and dry. No rash noted. He is not diaphoretic.  Psychiatric: He has a normal mood and affect. His behavior is normal. Thought content normal.  Nursing note and vitals reviewed.   ED Treatments / Results  DIAGNOSTIC STUDIES:  Oxygen Saturation is 99% on RA, normal by my interpretation.    COORDINATION OF CARE:  9:28 AM Will order lidocaine 2% viscous mouth solution. Pt will avoid acidic and spicy foods and follow up with GI doctor. Discussed treatment plan with pt at bedside and pt agreed to plan.  Labs (all labs ordered are listed, but only abnormal results are displayed) Labs Reviewed - No data to display  EKG  EKG Interpretation None       Radiology No results  found.  Procedures Procedures (including critical care time)  Medications Ordered in ED Medications  lidocaine (XYLOCAINE) 2 % viscous mouth solution 15 mL (15 mLs Mouth/Throat Given 10/10/15 0943)     Initial Impression / Assessment and Plan / ED Course  I have reviewed the triage vital signs and the nursing notes.  Pertinent labs & imaging results that were available during my care of the patient were reviewed by me and considered in my medical decision making (see chart for details).  Clinical Course    Patient with aphthous ulcer to left tongue. Treated with viscous lidocaine, prescription given for same. Also given prescription for omeprazole. Encouraged follow-up with PCP and gastroenterologist next week. No evidence of other acute or emergent pathology at this time. Overall appears well, nontoxic, hemodynamically stable and appropriate for discharge and outpatient follow-up.  Final Clinical Impressions(s) / ED Diagnoses   Final diagnoses:  Aphthous ulcer of tongue  I personally performed the services described in this documentation, which was scribed in my presence. The recorded information has been reviewed and is accurate.   New Prescriptions Discharge Medication List as of 10/10/2015  9:38 AM    START taking these medications   Details  lidocaine (XYLOCAINE) 2 % solution Use as directed 20 mLs in the mouth or throat as needed for mouth pain., Starting Sat 10/10/2015, Print    omeprazole (PRILOSEC) 20 MG capsule Take 1 capsule (20 mg total) by mouth daily., Starting Sat 10/10/2015, Print         Joycie Peek, PA-C 10/10/15 1026    Doug Sou, MD 10/10/15 [phone]

## 2015-10-10 NOTE — ED Triage Notes (Signed)
Pt c/o left tongue pain - states has 2 ulcers that began yesterday am. States experienced dry heaves yesterday am also. States has used salt water and peroxide - no relief.

## 2015-10-18 ENCOUNTER — Encounter (HOSPITAL_COMMUNITY): Payer: Self-pay | Admitting: Oncology

## 2015-10-18 ENCOUNTER — Emergency Department (HOSPITAL_COMMUNITY)
Admission: EM | Admit: 2015-10-18 | Discharge: 2015-10-19 | Disposition: A | Discharge status: Home / Self Care | Payer: 59 | Attending: Emergency Medicine | Admitting: Emergency Medicine

## 2015-10-18 DIAGNOSIS — K2970 Gastritis, unspecified, without bleeding: Secondary | ICD-10-CM

## 2015-10-18 DIAGNOSIS — Z791 Long term (current) use of non-steroidal anti-inflammatories (NSAID): Secondary | ICD-10-CM | POA: Diagnosis not present

## 2015-10-18 DIAGNOSIS — M25551 Pain in right hip: Secondary | ICD-10-CM | POA: Diagnosis not present

## 2015-10-18 DIAGNOSIS — R1013 Epigastric pain: Secondary | ICD-10-CM

## 2015-10-18 DIAGNOSIS — K649 Unspecified hemorrhoids: Secondary | ICD-10-CM | POA: Diagnosis not present

## 2015-10-18 LAB — CBC
HEMATOCRIT: 46.9 % (ref 39.0–52.0)
Hemoglobin: 16.7 g/dL (ref 13.0–17.0)
MCH: 30.1 pg (ref 26.0–34.0)
MCHC: 35.6 g/dL (ref 30.0–36.0)
MCV: 84.7 fL (ref 78.0–100.0)
Platelets: 233 10*3/uL (ref 150–400)
RBC: 5.54 MIL/uL (ref 4.22–5.81)
RDW: 13.1 % (ref 11.5–15.5)
WBC: 10.3 10*3/uL (ref 4.0–10.5)

## 2015-10-18 LAB — COMPREHENSIVE METABOLIC PANEL
ALK PHOS: 74 U/L (ref 38–126)
ALT: 66 U/L — AB (ref 17–63)
AST: 45 U/L — AB (ref 15–41)
Albumin: 4.5 g/dL (ref 3.5–5.0)
Anion gap: 8 (ref 5–15)
BUN: 13 mg/dL (ref 6–20)
CALCIUM: 9.2 mg/dL (ref 8.9–10.3)
CHLORIDE: 101 mmol/L (ref 101–111)
CO2: 28 mmol/L (ref 22–32)
CREATININE: 1.13 mg/dL (ref 0.61–1.24)
GFR calc Af Amer: >60 mL/min (ref >60)
Glucose, Bld: 82 mg/dL (ref 65–99)
Potassium: 3.6 mmol/L (ref 3.5–5.1)
SODIUM: 137 mmol/L (ref 135–145)
Total Bilirubin: 0.6 mg/dL (ref 0.3–1.2)
Total Protein: 7.5 g/dL (ref 6.5–8.1)

## 2015-10-18 LAB — TYPE AND SCREEN
ABO/RH(D): A POS
Antibody Screen: NEGATIVE

## 2015-10-18 MED ORDER — GI COCKTAIL ~~LOC~~
30.0000 mL | Freq: Once | ORAL | Status: AC
  Administered 2015-10-18: 30 mL via ORAL
  Filled 2015-10-18: qty 30

## 2015-10-18 MED ORDER — PANTOPRAZOLE SODIUM 40 MG PO TBEC: 40.0000 mg | DELAYED_RELEASE_TABLET | Freq: Every day | ORAL | Status: DC

## 2015-10-18 MED ORDER — HYDROCODONE-ACETAMINOPHEN 5-325 MG PO TABS
1.0000 | ORAL_TABLET | Freq: Once | ORAL | Status: DC
  Filled 2015-10-18: qty 1

## 2015-10-18 NOTE — ED Provider Notes (Signed)
[facility]-EMERGENCY DEPT Provider Note   CSN: [id number] Arrival date & time: 10/18/15  2023  First Provider Contact:  None       History   Chief Complaint Chief Complaint  Patient presents with  . GI Bleeding    HPI Bobby Mcbride is a 45 y.o. male.  HPI  With BM having bright red Epigastric abdominal pain, sharp burning pain Feels like pain ulcer Right hip, not sure if pulled a muscle, having trouble crossing legs Pain in chest  Threw up, looked like coffee grounds Severe pain No black tarry stool Bright red mixed in to the stool. On toilet paper and commode.  Small amount.  Feeling fatigued   Past Medical History:  Diagnosis Date  . GERD (gastroesophageal reflux disease)   . Headache(784.0)   . History of stomach ulcers   . Lateral meniscus tear    Right  . Mallory - Weiss tear   . Migraine with aura   . MVP (mitral valve prolapse)   . Rectal bleeding     Patient Active Problem List   Diagnosis Date Noted  . Back pain 10/25/2012  . URI (upper respiratory infection) 03/13/2012  . 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 GERD (gastroesophageal reflux disease)   . History of stomach ulcers   . Anxiety 09/07/2011  . Hemorrhoids 09/01/2011  . Renal cyst 08/23/2011  . Abdominal pain, other specified site 08/17/2011  . Hematemesis/vomiting blood 08/17/2011  . Headache(784.0) 08/12/2011    Past Surgical History:  Procedure Laterality Date  . APPENDECTOMY  1990's  . ELBOW SURGERY  1987   Right  . ESOPHAGOGASTRODUODENOSCOPY  08/18/2011   Procedure: ESOPHAGOGASTRODUODENOSCOPY (EGD);  Surgeon: Beverley Fiedler, MD;  Location: [facility];  Service: Gastroenterology;  Laterality: N/A;  . ESOPHAGOGASTRODUODENOSCOPY  11/11/2011   Procedure: ESOPHAGOGASTRODUODENOSCOPY (EGD);  Surgeon: Iva Boop, MD;  Location: [facility];  Service: Endoscopy;  Laterality: N/A;  . KNEE ARTHROSCOPY     Right  . SHOULDER SURGERY  2012   right rotator cuff       Home Medications    Prior  to Admission medications   Medication Sig Start Date End Date Taking? Authorizing Provider  ibuprofen (ADVIL,MOTRIN) 200 MG tablet Take 200 mg by mouth every 6 (six) hours as needed (for pain.).   Yes Historical Provider, MD  Menthol, Topical Analgesic, (BENGAY EX) Apply 1 application topically 4 (four) times daily as needed (for body aches.).   Yes Historical Provider, MD  RaNITidine HCl (ACID REDUCER PO) Take 1 tablet by mouth once.   Yes Historical Provider, MD  cyclobenzaprine (FLEXERIL) 10 MG tablet Take 1 tablet (10 mg total) by mouth 2 (two) times daily as needed for muscle spasms. 10/19/15   Alvira Monday, MD  lidocaine (XYLOCAINE) 2 % solution Use as directed 20 mLs in the mouth or throat as needed for mouth pain. Patient not taking: Reported on 10/18/2015 10/10/15   Joycie Peek, PA-C  pantoprazole (PROTONIX) 20 MG tablet Take 2 tablets (40 mg total) by mouth 2 (two) times daily. 10/19/15   Alvira Monday, MD    Family History Family History  Problem Relation Age of Onset  . Hypertension Mother   . Stroke Mother   . Migraines Mother   . Diabetes Mother   . Hypertension Father   . Stroke Father   . Heart failure Father   . Asthma Father   . Colon polyps Father   . Colon cancer Neg Hx   . Prostate cancer Neg Hx  Social History Social History  Substance Use Topics  . Smoking status: Never Smoker  . Smokeless tobacco: Never Used  . Alcohol use No     Allergies   Codeine; Hydrocodone-acetaminophen; Food; and Compazine [prochlorperazine edisylate]   Review of Systems Review of Systems  Constitutional: Negative for fever.  HENT: Negative for sore throat.   Eyes: Negative for visual disturbance.  Respiratory: Negative for shortness of breath.   Cardiovascular: Positive for chest pain (lower chest).  Gastrointestinal: Positive for abdominal pain, blood in stool, nausea and vomiting. Negative for constipation and diarrhea.  Genitourinary: Negative for difficulty  urinating.  Musculoskeletal: Positive for arthralgias. Negative for back pain and neck stiffness.  Skin: Negative for rash.  Neurological: Negative for syncope and headaches.     Physical Exam Updated Vital Signs BP 126/88   Pulse 66   Temp 97.8 F (36.6 C) (Oral)   Resp 12   Ht 5\' 7"  (1.702 m)   Wt 172 lb (78 kg)   SpO2 99%   BMI 26.94 kg/m   Physical Exam  Constitutional: He is oriented to person, place, and time. He appears well-developed and well-nourished. No distress.  HENT:  Head: Normocephalic and atraumatic.  Eyes: Conjunctivae and EOM are normal.  Neck: Normal range of motion.  Cardiovascular: Normal rate, regular rhythm, normal heart sounds and intact distal pulses.  Exam reveals no gallop and no friction rub.   No murmur heard. Pulmonary/Chest: Effort normal and breath sounds normal. No respiratory distress. He has no wheezes. He has no rales.  Abdominal: Soft. He exhibits no distension. There is tenderness (epigastric/xyphoid process). There is no guarding, no CVA tenderness, no tenderness at McBurney's point and negative Murphy's sign.  Musculoskeletal: He exhibits no edema.  Neurological: He is alert and oriented to person, place, and time.  Skin: Skin is warm and dry. He is not diaphoretic.  Nursing note and vitals reviewed.    ED Treatments / Results  Labs (all labs ordered are listed, but only abnormal results are displayed) Labs Reviewed  COMPREHENSIVE METABOLIC PANEL - Abnormal; Notable for the following:       Result Value   AST 45 (*)    ALT 66 (*)    All other components within normal limits  CBC  LIPASE, BLOOD  I-STAT TROPOININ, ED  POC OCCULT BLOOD, ED  TYPE AND SCREEN    EKG  EKG Interpretation  Date/Time:  Sunday October 18 2015 23:31:52 EDT Ventricular Rate:  49 PR Interval:    QRS Duration: 100 QT Interval:  432 QTC Calculation: 390 R Axis:   -44 Text Interpretation:  Sinus bradycardia Left axis deviation RSR' in V1 or V2,  probably normal variant Nonspecfic change V2, otherwise no significant change since prior ECG Confirmed by [facility] MD, Jevon Littlepage ([id number]) on 10/19/2015 12:54:13 AM       Radiology Dg Chest 2 View  Result Date: 10/19/2015 CLINICAL DATA:  Hematemesis, history of gastric ulcers. EXAM: CHEST  2 VIEW COMPARISON:  Chest radiograph November 29, 2014 FINDINGS: Cardiomediastinal silhouette is normal. The lungs are clear without pleural effusions or focal consolidations. Trachea projects midline and there is no pneumothorax. Soft tissue planes and included osseous structures are non-suspicious. 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 IMPRESSION: No acute cardiopulmonary process. Electronically Signed   By: Awilda Metroourtnay  Bloomer M.D.   On: 10/19/2015 01:01    Procedures Procedures (including critical care time)  Medications Ordered in ED Medications  pantoprazole (PROTONIX) EC tablet 40 mg (not administered)  diphenhydrAMINE (BENADRYL) capsule 25 mg (not administered)  gi cocktail (Maalox,Lidocaine,Donnatal) (30 mLs Oral Given 10/18/15 2348)  HYDROmorphone (DILAUDID) injection 1 mg (1 mg Intravenous Given 10/19/15 0044)     Initial Impression / Assessment and Plan / ED Course  I have reviewed the triage vital signs and the nursing notes.  Pertinent labs & imaging results that were available during my care of the patient were reviewed by me and considered in my medical decision making (see chart for details).  Clinical Course   45 year-old male with reported history of ulcers, mitral valve prolapse, gastroesophageal reflux, hemorrhoids presents with concern of coffee-ground emesis, epigastric abdominal pain, bright red rectal bleeding, and right hip pain.  On reviewing records, patient had endoscopy 11/11/2011 which showed gastropathy in the cardia the stomach, endoscopy 6/62013 showing duodenitis and mallory weiss tear. Patient without significant right upper quadrant tenderness, negative  Murphy sign and doubt cholecystitis. Patient's hemoglobin is normal, he has no melena on exam, and normal vital signs over hours of observation the emergency department, and have low suspicion for clinically significant GI bleed. Patient does have evidence of hemorrhoids on exam, and feel this likely explains the bright red rectal bleeding he describes.  Patient was given Protonix and GI cocktail, however denies any significant relief of his pain. Given IV dilaudid prior to lipase returning. Lipase returned negative. Hemoccult negative however poor sample.  Given location of the lower chest/epigastrium, ordered EKG and troponin which was showed no signs of ACS. Patient with good bilateral pulses in the upper and lower extremities, and doubt dissection. Chest x-ray shows no acute findings.  Patient reports pain in his right hip. He has good range of motion, doubt septic arthritis, no trauma to suggest fracture. Possible trochanteric bursitis versus other arthritic pain versus muscular pain. Given rx for flexeril. Recommend close PCP follow up.  Suspect gastritis versus PUD without significant GI bleed as cause of patient's discomfort. Recommend GI follow-up and PCP follow up as well as BID PPI.   Final Clinical Impressions(s) / ED Diagnoses   Final diagnoses:  Epigastric abdominal pain  Gastritis  Hemorrhoids, unspecified hemorrhoid type  Right hip pain    New Prescriptions New Prescriptions   CYCLOBENZAPRINE (FLEXERIL) 10 MG TABLET    Take 1 tablet (10 mg total) by mouth 2 (two) times daily as needed for muscle spasms.   PANTOPRAZOLE (PROTONIX) 20 MG TABLET    Take 2 tablets (40 mg total) by mouth 2 (two) times daily.     Alvira Monday, MD 10/19/15 [phone]

## 2015-10-18 NOTE — ED Triage Notes (Signed)
Pt is c/o hematemesis x 1, which pt describes at dark red as well as 2 episodes of bright red blood in his stool.  Pt has a known hx of gastric ulcers.

## 2015-10-19 ENCOUNTER — Telehealth: Payer: Self-pay | Admitting: Gastroenterology

## 2015-10-19 ENCOUNTER — Emergency Department (HOSPITAL_COMMUNITY): Payer: 59

## 2015-10-19 LAB — I-STAT TROPONIN, ED: TROPONIN I, POC: 0.01 ng/mL (ref 0.00–0.08)

## 2015-10-19 LAB — LIPASE, BLOOD: LIPASE: 32 U/L (ref 11–51)

## 2015-10-19 LAB — POC OCCULT BLOOD, ED: Fecal Occult Bld: NEGATIVE

## 2015-10-19 MED ORDER — PANTOPRAZOLE SODIUM 20 MG PO TBEC: 40.0000 mg | DELAYED_RELEASE_TABLET | Freq: Two times a day (BID) | ORAL | 0 refills | Status: DC

## 2015-10-19 MED ORDER — CYCLOBENZAPRINE HCL 10 MG PO TABS: 10.0000 mg | ORAL_TABLET | Freq: Two times a day (BID) | ORAL | 0 refills | Status: DC | PRN

## 2015-10-19 MED ORDER — HYDROMORPHONE HCL 1 MG/ML IJ SOLN
1.0000 mg | Freq: Once | INTRAMUSCULAR | Status: AC
  Administered 2015-10-19: 1 mg via INTRAVENOUS
  Filled 2015-10-19: qty 1

## 2015-10-19 MED ORDER — TRAMADOL HCL 50 MG PO TABS: 50.0000 mg | ORAL_TABLET | Freq: Once | ORAL | Status: DC

## 2015-10-19 MED ORDER — DIPHENHYDRAMINE HCL 25 MG PO CAPS
25.0000 mg | ORAL_CAPSULE | Freq: Once | ORAL | Status: AC
  Administered 2015-10-19: 25 mg via ORAL
  Filled 2015-10-19: qty 1

## 2015-10-20 ENCOUNTER — Telehealth: Payer: Self-pay | Admitting: Gastroenterology

## 2015-10-20 NOTE — Telephone Encounter (Signed)
See alternate note  

## 2015-10-20 NOTE — Telephone Encounter (Signed)
[facility] ED Sunday night with epigastric pain, coffee ground emesis.   No hematemesis today.  Pt was told to follow up with GI and PCP.  Verified he is taking pantoprazole twice daily.  He has not called PCP for appt yet, he will do that today.  Appt given for 01/05/16 with Dr Christella HartiganJacobs, is this to far out Dr Christella HartiganJacobs?  Would you like me to double book you or get him in with extender sooner?  The pt is aware that Dr Christella HartiganJacobs will be back Wed morning.

## 2015-10-20 NOTE — Telephone Encounter (Signed)
His CBC was normal, he was heme negative, was put on twice daily PPI.  I think next available ROV is OK, also add him to wait list in case a spot opens up sooner.  thanks

## 2015-10-21 NOTE — Telephone Encounter (Signed)
The pt is aware that the appt for 01/05/16 is ok, he was added to the wait list and will call with any concerns prior.

## 2016-01-05 ENCOUNTER — Ambulatory Visit: Payer: BLUE CROSS/BLUE SHIELD | Admitting: Gastroenterology

## 2016-04-02 ENCOUNTER — Emergency Department (HOSPITAL_COMMUNITY)
Admission: EM | Admit: 2016-04-02 | Discharge: 2016-04-03 | Disposition: A | Discharge status: Home / Self Care | Payer: 59 | Attending: Emergency Medicine | Admitting: Emergency Medicine

## 2016-04-02 ENCOUNTER — Encounter (HOSPITAL_COMMUNITY): Payer: Self-pay | Admitting: Nurse Practitioner

## 2016-04-02 DIAGNOSIS — Z79899 Other long term (current) drug therapy: Secondary | ICD-10-CM | POA: Insufficient documentation

## 2016-04-02 DIAGNOSIS — G43909 Migraine, unspecified, not intractable, without status migrainosus: Secondary | ICD-10-CM | POA: Insufficient documentation

## 2016-04-02 DIAGNOSIS — R51 Headache: Secondary | ICD-10-CM | POA: Diagnosis present

## 2016-04-02 DIAGNOSIS — G43009 Migraine without aura, not intractable, without status migrainosus: Secondary | ICD-10-CM

## 2016-04-02 LAB — BASIC METABOLIC PANEL
ANION GAP: 8 (ref 5–15)
BUN: 13 mg/dL (ref 6–20)
CALCIUM: 8.6 mg/dL — AB (ref 8.9–10.3)
CO2: 25 mmol/L (ref 22–32)
Chloride: 103 mmol/L (ref 101–111)
Creatinine, Ser: 1.03 mg/dL (ref 0.61–1.24)
GFR calc Af Amer: >60 mL/min (ref >60)
GFR calc non Af Amer: >60 mL/min (ref >60)
GLUCOSE: 97 mg/dL (ref 65–99)
POTASSIUM: 3.4 mmol/L — AB (ref 3.5–5.1)
Sodium: 136 mmol/L (ref 135–145)

## 2016-04-02 LAB — CBC
HEMATOCRIT: 43.8 % (ref 39.0–52.0)
HEMOGLOBIN: 16 g/dL (ref 13.0–17.0)
MCH: 30.1 pg (ref 26.0–34.0)
MCHC: 36.5 g/dL — AB (ref 30.0–36.0)
MCV: 82.3 fL (ref 78.0–100.0)
Platelets: 217 10*3/uL (ref 150–400)
RBC: 5.32 MIL/uL (ref 4.22–5.81)
RDW: 13.1 % (ref 11.5–15.5)
WBC: 9.5 10*3/uL (ref 4.0–10.5)

## 2016-04-02 MED ORDER — METOCLOPRAMIDE HCL 5 MG/ML IJ SOLN
10.0000 mg | Freq: Once | INTRAMUSCULAR | Status: AC
  Administered 2016-04-03: 10 mg via INTRAVENOUS
  Filled 2016-04-02: qty 2

## 2016-04-02 MED ORDER — DIPHENHYDRAMINE HCL 50 MG/ML IJ SOLN
25.0000 mg | Freq: Once | INTRAMUSCULAR | Status: AC
  Administered 2016-04-03: 50 mg via INTRAVENOUS
  Filled 2016-04-02: qty 1

## 2016-04-02 MED ORDER — SODIUM CHLORIDE 0.9 % IV BOLUS (SEPSIS)
1000.0000 mL | Freq: Once | INTRAVENOUS | Status: AC
  Administered 2016-04-03: 1000 mL via INTRAVENOUS

## 2016-04-02 NOTE — ED Provider Notes (Signed)
[facility] Provider Note   CSN: [id number] Arrival date & time: 04/02/16  1810   By signing my name below, I, Cynda Acres, attest that this documentation has been prepared under the direction and in the presence of Dione Booze, MD. Electronically Signed: Cynda Acres, Scribe. 04/02/16. 11:42 PM.  History   Chief Complaint Chief Complaint  Patient presents with  . Dizziness  . Headache    HPI Comments: QUANTAE MARTEL is a 46 y.o. male with a hx of migraines, who presents to the Emergency Department complaining of a sudden-onset head tightness that began today. Patient has associated  knot on the occipital aspect of the head, left sided arm tingling, nausea, dizziness, blurred vision (complete blur) and a headache. Patient reports the knot decreased in size. Patient states the light makes his vision worse. Nothing improves his pain. No modifying factors indicated. He denies any vomiting, chest pain, or medication ingestion.   The history is provided by the patient. No language interpreter was used.    Past Medical History:  Diagnosis Date  . GERD (gastroesophageal reflux disease)   . Headache(784.0)   . History of stomach ulcers   . Lateral meniscus tear    Right  . Mallory - Weiss tear   . Migraine with aura   . MVP (mitral valve prolapse)   . Rectal bleeding     Patient Active Problem List   Diagnosis Date Noted  . Back pain 10/25/2012  . URI (upper respiratory infection) 03/13/2012  . 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 GERD (gastroesophageal reflux disease)   . History of stomach ulcers   . Anxiety 09/07/2011  . Hemorrhoids 09/01/2011  . Renal cyst 08/23/2011  . Abdominal pain, other specified site 08/17/2011  . Hematemesis/vomiting blood 08/17/2011  . Headache(784.0) 08/12/2011    Past Surgical History:  Procedure Laterality Date  . APPENDECTOMY  1990's  . ELBOW SURGERY  1987   Right  . ESOPHAGOGASTRODUODENOSCOPY  08/18/2011   Procedure:  ESOPHAGOGASTRODUODENOSCOPY (EGD);  Surgeon: Beverley Fiedler, MD;  Location: [facility];  Service: Gastroenterology;  Laterality: N/A;  . ESOPHAGOGASTRODUODENOSCOPY  11/11/2011   Procedure: ESOPHAGOGASTRODUODENOSCOPY (EGD);  Surgeon: Iva Boop, MD;  Location: [facility];  Service: Endoscopy;  Laterality: N/A;  . KNEE ARTHROSCOPY     Right  . SHOULDER SURGERY  2012   right rotator cuff       Home Medications    Prior to Admission medications   Medication Sig Start Date End Date Taking? Authorizing Provider  cyclobenzaprine (FLEXERIL) 10 MG tablet Take 1 tablet (10 mg total) by mouth 2 (two) times daily as needed for muscle spasms. 10/19/15   Alvira Monday, MD  ibuprofen (ADVIL,MOTRIN) 200 MG tablet Take 200 mg by mouth every 6 (six) hours as needed (for pain.).    Historical Provider, MD  lidocaine (XYLOCAINE) 2 % solution Use as directed 20 mLs in the mouth or throat as needed for mouth pain. Patient not taking: Reported on 10/18/2015 10/10/15   Joycie Peek, PA-C  Menthol, Topical Analgesic, (BENGAY EX) Apply 1 application topically 4 (four) times daily as needed (for body aches.).    Historical Provider, MD  pantoprazole (PROTONIX) 20 MG tablet Take 2 tablets (40 mg total) by mouth 2 (two) times daily. 10/19/15   Alvira Monday, MD  RaNITidine HCl (ACID REDUCER PO) Take 1 tablet by mouth once.    Historical Provider, MD    Family History Family History  Problem Relation Age of Onset  . Hypertension Mother   .  Stroke Mother   . Migraines Mother   . Diabetes Mother   . Hypertension Father   . Stroke Father   . Heart failure Father   . Asthma Father   . Colon polyps Father   . Colon cancer Neg Hx   . Prostate cancer Neg Hx     Social History Social History  Substance Use Topics  . Smoking status: Never Smoker  . Smokeless tobacco: Never Used  . Alcohol use No     Allergies   Codeine; Hydrocodone-acetaminophen; Food; and Compazine [prochlorperazine  edisylate]   Review of Systems Review of Systems  Eyes: Positive for visual disturbance.  Gastrointestinal: Positive for nausea. Negative for vomiting.  Neurological: Positive for dizziness and headaches.  All other systems reviewed and are negative.    Physical Exam Updated Vital Signs BP 137/95 (BP Location: Left Arm)   Pulse 70   Temp 97.4 F (36.3 C) (Oral)   Resp 18   SpO2 100%   Physical Exam  Constitutional: He is oriented to person, place, and time. He appears well-developed and well-nourished.  HENT:  Head: Normocephalic and atraumatic.  Slight fluctuance superior to the left ear without definite mass.   Eyes: EOM are normal. Pupils are equal, round, and reactive to light.  Fundi are normal.   Neck: Normal range of motion. Neck supple. No JVD present.  Cardiovascular: Normal rate, regular rhythm and normal heart sounds.   No murmur heard. Pulmonary/Chest: Effort normal and breath sounds normal. He has no wheezes. He has no rales. He exhibits no tenderness.  Abdominal: Soft. Bowel sounds are normal. He exhibits no distension and no mass. There is no tenderness.  Musculoskeletal: Normal range of motion. He exhibits no edema.  Lymphadenopathy:    He has no cervical adenopathy.  Neurological: He is alert and oriented to person, place, and time. No cranial nerve deficit. He exhibits normal muscle tone. Coordination normal.  Skin: Skin is warm and dry. No rash noted.  Psychiatric: He has a normal mood and affect. His behavior is normal. Judgment and thought content normal.  Nursing note and vitals reviewed.    ED Treatments / Results  DIAGNOSTIC STUDIES: Oxygen Saturation is 100% on RA, normal by my interpretation.    COORDINATION OF CARE: 11:37 PM Discussed treatment plan with pt at bedside and pt agreed to plan.  Labs (all labs ordered are listed, but only abnormal results are displayed) Labs Reviewed  BASIC METABOLIC PANEL - Abnormal; Notable for the  following:       Result Value   Potassium 3.4 (*)    Calcium 8.6 (*)    All other components within normal limits  CBC - Abnormal; Notable for the following:    MCHC 36.5 (*)    All other components within normal limits  CBG MONITORING, ED    EKG  EKG Interpretation  Date/Time:  Saturday April 02 2016 20:41:27 EST Ventricular Rate:  57 PR Interval:    QRS Duration: 94 QT Interval:  418 QTC Calculation: 407 R Axis:   -28 Text Interpretation:  Sinus rhythm Borderline left axis deviation When compared with ECG of 10/18/2015, No significant change was found Confirmed by [facility]  MD, Kanaya Gunnarson ([id number]) on 04/03/2016 1:35:25 AM       Procedures Procedures (including critical care time)  Medications Ordered in ED Medications  sodium chloride 0.9 % bolus 1,000 mL (0 mLs Intravenous Stopped 04/03/16 0128)  metoCLOPramide (REGLAN) injection 10 mg (10 mg Intravenous Given 04/03/16 0025)  diphenhydrAMINE (BENADRYL) injection 25 mg (50 mg Intravenous Given 04/03/16 0025)     Initial Impression / Assessment and Plan / ED Course  I have reviewed the triage vital signs and the nursing notes.  Pertinent labs & imaging results that were available during my care of the patient were reviewed by me and considered in my medical decision making (see chart for details).  Head and discomfort which seems most consistent with migraine S-especially with vision disturbance, nausea, photophobia. Old records are reviewed, and he has no relevant past visits. The knot which had been on the left side of his head appears to be a cyst which has spontaneously ruptured. He is given a migraine cocktail of normal saline, metoclopramide, diphenhydramine with excellent relief of symptoms including is blurred vision. Left eye workup is unremarkable. He is discharged with prescription for metoclopramide and referred back to his PCP for follow-up.   Final Clinical Impressions(s) / ED Diagnoses   Final diagnoses:  Migraine  without aura and without status migrainosus, not intractable    New Prescriptions New Prescriptions   METOCLOPRAMIDE (REGLAN) 10 MG TABLET    Take 1 tablet (10 mg total) by mouth every 6 (six) hours as needed for nausea (or headache).   I personally performed the services described in this documentation, which was scribed in my presence. The recorded information has been reviewed and is accurate.       Dione Booze, MD 04/03/16 [phone]

## 2016-04-02 NOTE — ED Triage Notes (Signed)
Pt c/o dizziness,bluured vision, headache and tightness 10/10 and noticing a knot on the occipital aspect of his head.

## 2016-04-03 LAB — CBG MONITORING, ED: GLUCOSE-CAPILLARY: 79 mg/dL (ref 65–99)

## 2016-04-03 MED ORDER — METOCLOPRAMIDE HCL 10 MG PO TABS: 10.0000 mg | ORAL_TABLET | Freq: Four times a day (QID) | ORAL | 0 refills | Status: DC | PRN

## 2016-12-28 ENCOUNTER — Ambulatory Visit (HOSPITAL_COMMUNITY)
Admission: EM | Admit: 2016-12-28 | Discharge: 2016-12-28 | Disposition: A | Discharge status: Home / Self Care | Payer: 59 | Attending: Family Medicine | Admitting: Family Medicine

## 2016-12-28 ENCOUNTER — Encounter (HOSPITAL_COMMUNITY): Payer: Self-pay | Admitting: Emergency Medicine

## 2016-12-28 DIAGNOSIS — J069 Acute upper respiratory infection, unspecified: Secondary | ICD-10-CM

## 2016-12-28 NOTE — Discharge Instructions (Signed)
Continue with over the counter cough and flu medication as discussed, take as label recommended. Push fluids to increase hydration. If symptoms worsen, develop increased shortness of breath or pain to return to be seen.

## 2016-12-28 NOTE — ED Provider Notes (Signed)
[facility]    CSN: [id number] Arrival date & time: 12/28/16  1520     History   Chief Complaint Chief Complaint  Patient presents with  . Generalized Body Aches  . Cough    HPI Bobby Mcbride is a [age] y.o. male.   Bobby Mcbride presents with his wife with complaints of cough, vomiting and fatigue which started three days ago. He has bodyaches, headaches and had diarrhea as well. He has not vomited since yesterday. He had diarrhea earlier today. He rates his pain 10/10. He states he has chest pain when he coughs, it does not hurt while at rest. He was exposed to an ill coworker as well. Cough is non productive. Denies shortness of breath runny nose or ear pain. Mild sore throat. Has been taking OTC flu medication which has tylenol and decongestants which have helped some. Has been urinating without difficulty. No known specific fevers.       Past Medical History:  Diagnosis Date  . GERD (gastroesophageal reflux disease)   . Headache(784.0)   . History of stomach ulcers   . Lateral meniscus tear    Right  . Mallory - Weiss tear   . Migraine with aura   . MVP (mitral valve prolapse)   . Rectal bleeding     Patient Active Problem List   Diagnosis Date Noted  . Back pain 10/25/2012  . URI (upper respiratory infection) 03/13/2012  . 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 GERD (gastroesophageal reflux disease)   . History of stomach ulcers   . Anxiety 09/07/2011  . Hemorrhoids 09/01/2011  . Renal cyst 08/23/2011  . Abdominal pain, other specified site 08/17/2011  . Hematemesis/vomiting blood 08/17/2011  . Headache(784.0) 08/12/2011    Past Surgical History:  Procedure Laterality Date  . APPENDECTOMY  1990's  . ELBOW SURGERY  1987   Right  . ESOPHAGOGASTRODUODENOSCOPY  08/18/2011   Procedure: ESOPHAGOGASTRODUODENOSCOPY (EGD);  Surgeon: Beverley FiedlerJay M Pyrtle, MD;  [facility];  Service: Gastroenterology;  Laterality: N/A;  . ESOPHAGOGASTRODUODENOSCOPY  11/11/2011   Procedure: ESOPHAGOGASTRODUODENOSCOPY (EGD);  Surgeon: Iva Booparl E Gessner, MD;  [facility];  Service: Endoscopy;  Laterality: N/A;  . KNEE ARTHROSCOPY     Right  . SHOULDER SURGERY  2012   right rotator cuff       Home Medications    Prior to Admission medications   Medication Sig Start Date End Date Taking? Authorizing Provider  metoCLOPramide (REGLAN) 10 MG tablet Take 1 tablet (10 mg total) by mouth every 6 (six) hours as needed for nausea (or headache). 04/03/16   Dione BoozeGlick, David, MD    Family History Family History  Problem Relation Age of Onset  . Hypertension Mother   . Stroke Mother   . Migraines Mother   . Diabetes Mother   . Hypertension Father   . Stroke Father   . Heart failure Father   . Asthma Father   . Colon polyps Father   . Colon cancer Neg Hx   . Prostate cancer Neg Hx     Social History Social History  Substance Use Topics  . Smoking status: Never Smoker  . Smokeless tobacco: Never Used  . Alcohol use No     Allergies   Codeine; Hydrocodone-acetaminophen; Food; Ibuprofen; and Compazine [prochlorperazine edisylate]   Review of Systems Review of Systems  Constitutional: Positive for fatigue.  HENT: Positive for sore throat.   Respiratory: Positive for cough. Negative for shortness of breath.   Cardiovascular: Negative.  Gastrointestinal: Positive for diarrhea and vomiting. Negative for abdominal pain.  Genitourinary: Negative.   Musculoskeletal: Positive for arthralgias.     Physical Exam Triage Vital Signs ED Triage Vitals [12/28/16 1558]  Enc Vitals Group     BP 126/87     Pulse Rate 91     Resp 18     Temp 98.2 F (36.8 C)     Temp Source Oral     SpO2 98 %     Weight      Height      Head Circumference      Peak Flow      Pain Score 10     Pain Loc      Pain Edu?      Excl. in GC?    No data found.   Updated Vital Signs BP 126/87 (BP Location: Left Arm)   Pulse 91   Temp 98.2 F (36.8 C) (Oral)   Resp 18    SpO2 98%   Visual Acuity Right Eye Distance:   Left Eye Distance:   Bilateral Distance:    Right Eye Near:   Left Eye Near:    Bilateral Near:     Physical Exam  Constitutional: He is oriented to person, place, and time. He appears well-developed and well-nourished.  HENT:  Head: Normocephalic and atraumatic.  Right Ear: External ear normal.  Left Ear: External ear normal.  Nose: Nose normal.  Mouth/Throat: Oropharynx is clear and moist.  Eyes: Pupils are equal, round, and reactive to light. Conjunctivae and EOM are normal.  Neck: Normal range of motion.  Cardiovascular: Normal rate and regular rhythm.   Pulmonary/Chest: Effort normal and breath sounds normal. No respiratory distress.  Strong congested cough noted  Abdominal: Soft.  Neurological: He is alert and oriented to person, place, and time.  Skin: Skin is warm.     UC Treatments / Results  Labs (all labs ordered are listed, but only abnormal results are displayed) Labs Reviewed - No data to display  EKG  EKG Interpretation None       Radiology No results found.  Procedures Procedures (including critical care time)  Medications Ordered in UC Medications - No data to display   Initial Impression / Assessment and Plan / UC Course  I have reviewed the triage vital signs and the nursing notes.  Pertinent labs & imaging results that were available during my care of the patient were reviewed by me and considered in my medical decision making (see chart for details).     Exam and vitals consistent with viral illness. Recommended continued supportive cares at this time. Push fluids. Rest. Diet as tolerated. Continue with OTC medication for symptoms. If develop increased shortness of breath, pain, or signs of dehydration to return to be seen. Patient and spouse agreeable to plan and verbalized understanding of instructions.   Final Clinical Impressions(s) / UC Diagnoses   Final diagnoses:  Upper  respiratory tract infection, unspecified type    New Prescriptions Discharge Medication List as of 12/28/2016  4:24 PM       Controlled Substance Prescriptions [place] Controlled Substance Registry consulted? Not Applicable   Georgetta Haber, NP 12/28/16 [phone]

## 2016-12-28 NOTE — ED Triage Notes (Signed)
Pt c/o vomiting, coughing, states "i dont know if i've got the flu or bronchitis". Chest congestion. Body aches.

## 2017-01-02 ENCOUNTER — Ambulatory Visit: Payer: 59 | Admitting: Family Medicine

## 2017-11-16 ENCOUNTER — Other Ambulatory Visit (INDEPENDENT_AMBULATORY_CARE_PROVIDER_SITE_OTHER): Payer: BLUE CROSS/BLUE SHIELD

## 2017-11-16 ENCOUNTER — Ambulatory Visit (INDEPENDENT_AMBULATORY_CARE_PROVIDER_SITE_OTHER): Payer: BLUE CROSS/BLUE SHIELD | Admitting: Nurse Practitioner

## 2017-11-16 ENCOUNTER — Encounter: Payer: Self-pay | Admitting: Nurse Practitioner

## 2017-11-16 ENCOUNTER — Encounter (INDEPENDENT_AMBULATORY_CARE_PROVIDER_SITE_OTHER): Payer: Self-pay

## 2017-11-16 ENCOUNTER — Encounter

## 2017-11-16 VITALS — BP 126/90 | HR 68 | Ht 67.0 in | Wt 171.0 lb

## 2017-11-16 DIAGNOSIS — R110 Nausea: Secondary | ICD-10-CM

## 2017-11-16 DIAGNOSIS — K219 Gastro-esophageal reflux disease without esophagitis: Secondary | ICD-10-CM

## 2017-11-16 DIAGNOSIS — R432 Parageusia: Secondary | ICD-10-CM

## 2017-11-16 LAB — CBC
HCT: 47.9 % (ref 39.0–52.0)
Hemoglobin: 16.8 g/dL (ref 13.0–17.0)
MCHC: 35.1 g/dL (ref 30.0–36.0)
MCV: 86.2 fl (ref 78.0–100.0)
Platelets: 245 10*3/uL (ref 150.0–400.0)
RBC: 5.56 Mil/uL (ref 4.22–5.81)
RDW: 13.2 % (ref 11.5–15.5)
WBC: 10.8 10*3/uL — ABNORMAL HIGH (ref 4.0–10.5)

## 2017-11-16 MED ORDER — OMEPRAZOLE 40 MG PO CPDR: 40.0000 mg | DELAYED_RELEASE_CAPSULE | ORAL | 2 refills | Status: DC

## 2017-11-16 NOTE — Patient Instructions (Addendum)
If you are age 48 or older, your body mass index should be between 23-30. Your Body mass index is 26.78 kg/m. If this is out of the aforementioned range listed, please consider follow up with your Primary Care Provider.  If you are age 74 or younger, your body mass index should be between 19-25. Your Body mass index is 26.78 kg/m. If this is out of the aformentioned range listed, please consider follow up with your Primary Care Provider.   You have been scheduled for an endoscopy. Please follow written instructions given to you at your visit today. If you use inhalers (even only as needed), please bring them with you on the day of your procedure. Your physician has requested that you go to www.startemmi.com and enter the access code given to you at your visit today. This web site gives a general overview about your procedure. However, you should still follow specific instructions given to you by our office regarding your preparation for the procedure.  Your provider has requested that you go to the basement level for lab work before leaving today. Press "B" on the elevator. The lab is located at the first door on the left as you exit the elevator. CBC  We have sent the following medications to your pharmacy for you to pick up at your convenience: Omeprazole 40 mg  Use wedge pillow.  You have been given GERD literature handout.  Thank you for choosing me and [facility].   Willette Cluster, NP

## 2017-11-16 NOTE — Progress Notes (Signed)
GI Provider:  Wendall Papa, MD  Chief Complaint: Multiple complaints including dysphagia, nausea, tasting blood in mouth, fatigue   ASSESSMENT AND PLAN;   1. 47 yo male here with complaints of tasting blood in mouth for last few years, now fatigued and concerned given history of "stomach ulcers". He can't elaborate about the taste other than to say it taste like blood but isn't metallic like. No black stool. Suspect dysgeusia rather than actual presence of blood in mouth unless sinus drainage? On on treatment for GERD so maybe reflux related?  ste sensation seem less likely to be blood. Could be sinus drainage or just reflux?? 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  -He is having heartburn 1-2 times a week, untreated. Will start daily PPI -anti-reflux measures discussed. Recommend wedge pillow -tried to provide reassurance.  He mentions the "six ulcers' in stomach on EGD in 2013.  I reviewed findings on both EGDs in 2013 with the patient. We discussed gastritis, duodenitis and the Mallory Weiss tear. No ulcers found, the hematemesis was likely from [employer] tear. He is still anxious. He has dysphagia, postprandial nausea, and it has been several years since last EGD so not unreasonable to take another look. The risks and benefits of EGD were discussed and the patient agrees to proceed.   2. Solid food dysphagia, present for a couple of years. Mainly solids tend to hold up in esophagus.  -need to treat GERD sx, starting PPI today  -further recommendations at time of EGD   HPI:    Antone is a 47 year old male saw Dr.Jacobs May 2013 with rectal bleeding and perianal pain.  He was scheduled for colonoscopy but in the interim went to the hospital with hematemesis and upper abdominal pain. EGD showed non-bleeding Mallory Weiss tear, mild duodenitis and also nodularity at GE junction, biopsies actually showed inflamed cardia. No h.pylori.  His  hemoglobin did not drop with the hematemesisl, stayed in the 16-17 range . A couple of weeks later patient was able to undergo the scheduled colonoscopy which was normal except for hemorrhoids, ten year follow up recommended. Two months later patient went back to ED with recurrent hematemesis. Repeat EGD revealed gastropathy in the anterior wall of the gastric body and small area of abnormal mucosa in the cardia (not rebiopsied). He didn't require blood transfusion with this second episode of hematemesis either, hgb continued to stay in 16-17 range.   Patient is here with complaints of tasting blood. He frequently gets nauseated after meals and then tastes blood in mouth.  Doesn't take any medications.  He doesn't have any sinus drainage, no epistaxis. No black stools. He takes Ibuprofen and Goody's but rarely. Weight stable. He has occasional nausea. 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 He also complains of solid food dysphagia over the last two years, especially to meats / breads. Eats slow, takes small bites. Patient gets frequent heartburn, worse at night. Sleeps on pillows.    Past Medical History:  Diagnosis Date  . GERD (gastroesophageal reflux disease)   . Headache(784.0)   . History of stomach ulcers   . Lateral meniscus tear    Right  . Mallory - Weiss tear   . Migraine with aura   . MVP (mitral valve prolapse)   . Rectal bleeding      Past Surgical History:  Procedure Laterality Date  . APPENDECTOMY  1990's  . ELBOW  SURGERY  1987   Right  . ESOPHAGOGASTRODUODENOSCOPY  08/18/2011   Procedure: ESOPHAGOGASTRODUODENOSCOPY (EGD);  Surgeon: Beverley Fiedler, MD;  Location: [facility];  Service: Gastroenterology;  Laterality: N/A;  . ESOPHAGOGASTRODUODENOSCOPY  11/11/2011   Procedure: ESOPHAGOGASTRODUODENOSCOPY (EGD);  Surgeon: Iva Boop, MD;  Location: [facility];  Service: Endoscopy;  Laterality: N/A;  . KNEE ARTHROSCOPY     Right  . SHOULDER SURGERY  2012     right rotator cuff   Family History  Problem Relation Age of Onset  . Hypertension Mother   . Stroke Mother   . Migraines Mother   . Diabetes Mother   . Hypertension Father   . Stroke Father   . Heart failure Father   . Asthma Father   . Colon polyps Father   . Colon cancer Neg Hx   . Prostate cancer Neg Hx    Social History   Tobacco Use  . Smoking status: Never Smoker  . Smokeless tobacco: Never Used  Substance Use Topics  . Alcohol use: No  . Drug use: No   No current outpatient medications on file.   No current facility-administered medications for this visit.    Allergies  Allergen Reactions  . Codeine Other (See Comments)    Hallucinations  . Hydrocodone-Acetaminophen Other (See Comments)    Hallucinations  . Food     Pepperoni-nausea/vomiting Lettuce-nausea/vomiting Oranges-nausea/vomiting Cheese-nausea/vomiting  . Ibuprofen     "ulcers in my stomach"  . Compazine [Prochlorperazine Edisylate] Anxiety     Review of Systems: All systems reviewed and negative except where noted in HPI.   Creatinine clearance cannot be calculated (Patient's most recent lab result is older than the maximum 21 days allowed.)   Physical Exam:    Wt Readings from Last 3 Encounters:  11/16/17 171 lb (77.6 kg)  10/18/15 172 lb (78 kg)  01/13/15 170 lb (77.1 kg)    BP 126/90   Pulse 68   Ht 5\' 7"  (1.702 m)   Wt 171 lb (77.6 kg)   BMI 26.78 kg/m   Constitutional:  Pleasant male in no acute distress. Psychiatric: Normal mood and affect. Behavior is normal. EENT: Pupils normal.  Conjunctivae are normal. No scleral icterus. Neck supple.  Cardiovascular: Normal rate, regular rhythm. No edema Pulmonary/chest: Effort normal and breath sounds normal. No wheezing, rales or rhonchi. Abdominal: Soft, nondistended, nontender. Bowel sounds active throughout. There are no masses palpable. No hepatomegaly. Neurological: Alert and oriented to person place and time. Skin: Skin  is warm and dry. No rashes noted.  Willette Cluster, NP  11/16/2017, 1:58 PM

## 2017-11-17 ENCOUNTER — Encounter: Payer: Self-pay | Admitting: Nurse Practitioner

## 2017-11-17 ENCOUNTER — Encounter: Payer: Self-pay | Admitting: Gastroenterology

## 2017-11-17 NOTE — Progress Notes (Signed)
I agree with the above note, plan 

## 2017-11-24 ENCOUNTER — Ambulatory Visit (AMBULATORY_SURGERY_CENTER): Payer: BLUE CROSS/BLUE SHIELD | Admitting: Gastroenterology

## 2017-11-24 ENCOUNTER — Encounter: Payer: Self-pay | Admitting: Gastroenterology

## 2017-11-24 VITALS — BP 116/76 | HR 64 | Temp 99.6°F | Resp 16 | Ht 67.0 in | Wt 171.0 lb

## 2017-11-24 DIAGNOSIS — R110 Nausea: Secondary | ICD-10-CM | POA: Diagnosis not present

## 2017-11-24 DIAGNOSIS — K920 Hematemesis: Secondary | ICD-10-CM

## 2017-11-24 DIAGNOSIS — K209 Esophagitis, unspecified without bleeding: Secondary | ICD-10-CM

## 2017-11-24 DIAGNOSIS — K219 Gastro-esophageal reflux disease without esophagitis: Secondary | ICD-10-CM | POA: Diagnosis not present

## 2017-11-24 MED ORDER — SODIUM CHLORIDE 0.9 % IV SOLN: 500.0000 mL | Freq: Once | INTRAVENOUS | Status: DC

## 2017-11-24 NOTE — Patient Instructions (Signed)
**   Handouts given on esophagitis **   YOU HAD AN ENDOSCOPIC PROCEDURE TODAY AT THE [facility]:   Refer to the procedure report that was given to you for any specific questions about what was found during the examination.  If the procedure report does not answer your questions, please call your gastroenterologist to clarify.  If you requested that your care partner not be given the details of your procedure findings, then the procedure report has been included in a sealed envelope for you to review at your convenience later.  YOU SHOULD EXPECT: Some feelings of bloating in the abdomen. Passage of more gas than usual.  Walking can help get rid of the air that was put into your GI tract during the procedure and reduce the bloating. If you had a lower endoscopy (such as a colonoscopy or flexible sigmoidoscopy) you may notice spotting of blood in your stool or on the toilet paper. If you underwent a bowel prep for your procedure, you may not have a normal bowel movement for a few days.  Please Note:  You might notice some irritation and congestion in your nose or some drainage.  This is from the oxygen used during your procedure.  There is no need for concern and it should clear up in a day or so.  SYMPTOMS TO REPORT IMMEDIATELY:   Following upper endoscopy (EGD)  Vomiting of blood or coffee ground material  New chest pain or pain under the shoulder blades  Painful or persistently difficult swallowing  New shortness of breath  Fever of 100F or higher  Black, tarry-looking stools  For urgent or emergent issues, a gastroenterologist can be reached at any hour by calling [phone].   DIET:  We do recommend a small meal at first, but then you may proceed to your regular diet.  Drink plenty of fluids but you should avoid alcoholic beverages for 24 hours.  ACTIVITY:  You should plan to take it easy for the rest of today and you should NOT DRIVE or use heavy machinery until tomorrow  (because of the sedation medicines used during the test).    FOLLOW UP: Our staff will call the number listed on your records the next business day following your procedure to check on you and address any questions or concerns that you may have regarding the information given to you following your procedure. If we do not reach you, we will leave a message.  However, if you are feeling well and you are not experiencing any problems, there is no need to return our call.  We will assume that you have returned to your regular daily activities without incident.  If any biopsies were taken you will be contacted by phone or by letter within the next 1-3 weeks.  Please call us at [phone] if you have not heard about the biopsies in 3 weeks.    SIGNATURES/CONFIDENTIALITY: You and/or your care partner have signed paperwork which will be entered into your electronic medical record.  These signatures attest to the fact that that the information above on your After Visit Summary has been reviewed and is understood.  Full responsibility of the confidentiality of this discharge information lies with you and/or your care-partner.

## 2017-11-24 NOTE — Progress Notes (Signed)
Report to PACU, RN, vss, BBS= Clear.  

## 2017-11-24 NOTE — Op Note (Signed)
[facility] Patient Name: Bobby Mcbride Procedure Date: 11/24/2017 8:52 AM MRN: [id number] Endoscopist: Rachael Feeaniel P Jacobs , MD Age: [age] Referring MD:  Date of Birth: 07/07/1970 Gender: Male Account #: [id number] Procedure:                Upper GI endoscopy Indications:              Dysphagia, Heartburn, Hematemesis (taste of blood                            in his mouth), Epigastric pain after eating. Medicines:                Monitored Anesthesia Care Procedure:                Pre-Anesthesia Assessment:                           - Prior to the procedure, a History and Physical                            was performed, and patient medications and                            allergies were reviewed. The patient's tolerance of                            previous anesthesia was also reviewed. The risks                            and benefits of the procedure and the sedation                            options and risks were discussed with the patient.                            All questions were answered, and informed consent                            was obtained. Prior Anticoagulants: The patient has                            taken no previous anticoagulant or antiplatelet                            agents. ASA Grade Assessment: III - A patient with                            severe systemic disease. After reviewing the risks                            and benefits, the patient was deemed in                            satisfactory condition to undergo the procedure.  After obtaining informed consent, the endoscope was                            passed under direct vision. Throughout the                            procedure, the patient's blood pressure, pulse, and                            oxygen saturations were monitored continuously. The                            Endoscope was introduced through the mouth, and                            advanced to  the second part of duodenum. The upper                            GI endoscopy was accomplished without difficulty.                            The patient tolerated the procedure well. Scope In: Scope Out: Findings:                 LA Grade A (one or more mucosal breaks less than 5                            mm, not extending between tops of 2 mucosal folds)                            esophagitis was found at the gastroesophageal                            junction.                           The exam was otherwise without abnormality. Complications:            No immediate complications. Estimated blood loss:                            None. Estimated Blood Loss:     Estimated blood loss: none. Impression:               - LA Grade A reflux esophagitis.                           - The examination was otherwise normal.                           - No specimens collected. Recommendation:           - Patient has a contact number available for                            emergencies. The signs and symptoms of potential  delayed complications were discussed with the                            patient. Return to normal activities tomorrow.                            Written discharge instructions were provided to the                            patient.                           - Resume previous diet.                           - Continue present medications. Please continue the                            omeprazole 40mg  pills that were called in last                            week. Take one pill 20-30 min prior to breakfast                            meal daily. Please call the office in 6-7 weeks to                            report on how you feel after taking the omeprazole                            every day until then. Rachael Fee, MD 11/24/2017 9:03:46 AM This report has been signed electronically.

## 2017-11-27 ENCOUNTER — Telehealth: Payer: Self-pay

## 2017-11-27 NOTE — Telephone Encounter (Signed)
  Follow up Call-  Call Jarrod Bodkins number 11/24/2017  Post procedure Call Dessie Tatem phone  # [phone]  Permission to leave phone message Yes  Some recent data might be hidden     Patient questions:  Do you have a fever, pain , or abdominal swelling? No. Pain Score  0 *  Have you tolerated food without any problems? Yes.    Have you been able to return to your normal activities? Yes.    Do you have any questions about your discharge instructions: Diet   No. Medications  No. Follow up visit  No.  Do you have questions or concerns about your Care? No.  Actions: * If pain score is 4 or above: No action needed, pain <4.

## 2018-02-02 ENCOUNTER — Telehealth: Payer: Self-pay | Admitting: Gastroenterology

## 2018-02-02 NOTE — Telephone Encounter (Signed)
Pt wife return your call °

## 2018-02-02 NOTE — Telephone Encounter (Signed)
Left message on machine to call back  

## 2018-02-02 NOTE — Telephone Encounter (Signed)
The pt called back to let Dr Christella HartiganJacobs know that he is on omeprazole 40 mg daily 30 min prior to breakfast.  He states he not doing any better. He got choked on his lunch last week.  Please advise

## 2018-02-02 NOTE — Telephone Encounter (Signed)
Pt's wife calling to inform that omeprazole is not working, pt sxs have gotten worse. Needs some advise.

## 2018-02-05 MED ORDER — OMEPRAZOLE 40 MG PO CPDR: 40.0000 mg | DELAYED_RELEASE_CAPSULE | Freq: Two times a day (BID) | ORAL | 5 refills | Status: DC

## 2018-02-05 NOTE — Telephone Encounter (Signed)
Okay, can you double the prescription so that he is taking omeprazole 40 mg orally twice daily.  Shortly before breakfast and also shortly before dinner.  Offer him a new prescription for 60 pills with 5 refills.  He should again call in 4 to 5 weeks to report on his response.

## 2018-02-05 NOTE — Telephone Encounter (Signed)
Pt notified and prescription sent to the pharmacy

## 2018-02-18 ENCOUNTER — Encounter (HOSPITAL_COMMUNITY): Payer: Self-pay | Admitting: Emergency Medicine

## 2018-02-18 ENCOUNTER — Other Ambulatory Visit: Payer: Self-pay

## 2018-02-18 ENCOUNTER — Emergency Department (HOSPITAL_COMMUNITY): Payer: BLUE CROSS/BLUE SHIELD

## 2018-02-18 ENCOUNTER — Emergency Department (HOSPITAL_COMMUNITY)
Admission: EM | Admit: 2018-02-18 | Discharge: 2018-02-18 | Disposition: A | Discharge status: Home / Self Care | Payer: BLUE CROSS/BLUE SHIELD | Attending: Emergency Medicine | Admitting: Emergency Medicine

## 2018-02-18 DIAGNOSIS — R05 Cough: Secondary | ICD-10-CM

## 2018-02-18 DIAGNOSIS — J208 Acute bronchitis due to other specified organisms: Secondary | ICD-10-CM | POA: Insufficient documentation

## 2018-02-18 DIAGNOSIS — B9789 Other viral agents as the cause of diseases classified elsewhere: Secondary | ICD-10-CM | POA: Diagnosis not present

## 2018-02-18 DIAGNOSIS — R0602 Shortness of breath: Secondary | ICD-10-CM | POA: Diagnosis not present

## 2018-02-18 DIAGNOSIS — R079 Chest pain, unspecified: Secondary | ICD-10-CM | POA: Diagnosis not present

## 2018-02-18 DIAGNOSIS — R059 Cough, unspecified: Secondary | ICD-10-CM

## 2018-02-18 DIAGNOSIS — Z209 Contact with and (suspected) exposure to unspecified communicable disease: Secondary | ICD-10-CM | POA: Insufficient documentation

## 2018-02-18 MED ORDER — BENZONATATE 100 MG PO CAPS
200.0000 mg | ORAL_CAPSULE | Freq: Once | ORAL | Status: AC
  Administered 2018-02-18: 200 mg via ORAL
  Filled 2018-02-18: qty 2

## 2018-02-18 MED ORDER — ALBUTEROL SULFATE (2.5 MG/3ML) 0.083% IN NEBU
5.0000 mg | INHALATION_SOLUTION | Freq: Once | RESPIRATORY_TRACT | Status: AC
  Administered 2018-02-18: 5 mg via RESPIRATORY_TRACT
  Filled 2018-02-18: qty 6

## 2018-02-18 MED ORDER — PREDNISONE 50 MG PO TABS: ORAL_TABLET | ORAL | 0 refills | Status: DC

## 2018-02-18 MED ORDER — BENZONATATE 100 MG PO CAPS: 100.0000 mg | ORAL_CAPSULE | Freq: Three times a day (TID) | ORAL | 0 refills | Status: DC

## 2018-02-18 MED ORDER — ALBUTEROL SULFATE HFA 108 (90 BASE) MCG/ACT IN AERS: 1.0000 | INHALATION_SPRAY | Freq: Four times a day (QID) | RESPIRATORY_TRACT | 0 refills | Status: DC | PRN

## 2018-02-18 NOTE — ED Notes (Signed)
Patient transported to X-ray 

## 2018-02-18 NOTE — ED Triage Notes (Signed)
Pt c/o cough that is non-productive, body aches and congestion since Thursday. Been taking OTC medications that arent helping symptoms.

## 2018-02-18 NOTE — ED Notes (Signed)
Bed: WTR5 Expected date:  Expected time:  Means of arrival:  Comments: 

## 2018-02-18 NOTE — ED Provider Notes (Signed)
[facility]-EMERGENCY DEPT Provider Note   CSN: [id number] Arrival date & time: 02/18/18  0847     History   Chief Complaint Chief Complaint  Patient presents with  . Cough  . Nasal Congestion  . Generalized Body Aches    HPI ZAMARION LONGEST is a 47 y.o. male.  47 year old male presents with 2-day history of URI symptoms consisting of nonproductive cough, central facial congestion, mild sore throat.  Subjective fever but no vomiting or diarrhea.  No headache or neck discomfort.  Denies any photophobia or rashes.  Has been using over-the-counter medications without relief.  Does note positive sick exposures.     Past Medical History:  Diagnosis Date  . GERD (gastroesophageal reflux disease)   . Headache(784.0)   . History of stomach ulcers   . Lateral meniscus tear    Right  . Mallory - Weiss tear   . Migraine with aura   . MVP (mitral valve prolapse)   . Rectal bleeding     Patient Active Problem List   Diagnosis Date Noted  . Back pain 10/25/2012  . URI (upper respiratory infection) 03/13/2012  . 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 GERD (gastroesophageal reflux disease)   . History of stomach ulcers   . Anxiety 09/07/2011  . Hemorrhoids 09/01/2011  . Renal cyst 08/23/2011  . Abdominal pain, other specified site 08/17/2011  . Hematemesis/vomiting blood 08/17/2011  . Headache(784.0) 08/12/2011    Past Surgical History:  Procedure Laterality Date  . APPENDECTOMY  1990's  . ELBOW SURGERY  1987   Right  . ESOPHAGOGASTRODUODENOSCOPY  08/18/2011   Procedure: ESOPHAGOGASTRODUODENOSCOPY (EGD);  Surgeon: Beverley Fiedler, MD;  Location: [facility];  Service: Gastroenterology;  Laterality: N/A;  . ESOPHAGOGASTRODUODENOSCOPY  11/11/2011   Procedure: ESOPHAGOGASTRODUODENOSCOPY (EGD);  Surgeon: Iva Boop, MD;  Location: [facility];  Service: Endoscopy;  Laterality: N/A;  . KNEE ARTHROSCOPY     Right  . SHOULDER SURGERY  2012   right rotator cuff         Home Medications    Prior to Admission medications   Medication Sig Start Date End Date Taking? Authorizing Provider  omeprazole (PRILOSEC) 40 MG capsule Take 1 capsule (40 mg total) by mouth 2 (two) times daily. Take 30 minutes before breakfast 02/05/18 03/07/18  Rachael Fee, MD    Family History Family History  Problem Relation Age of Onset  . Hypertension Mother   . Stroke Mother   . Migraines Mother   . Diabetes Mother   . Hypertension Father   . Stroke Father   . Heart failure Father   . Asthma Father   . Colon polyps Father   . Colon cancer Neg Hx   . Prostate cancer Neg Hx   . Esophageal cancer Neg Hx   . Stomach cancer Neg Hx     Social History Social History   Tobacco Use  . Smoking status: Never Smoker  . Smokeless tobacco: Never Used  Substance Use Topics  . Alcohol use: No  . Drug use: No     Allergies   Codeine; Hydrocodone-acetaminophen; Food; Ibuprofen; and Compazine [prochlorperazine edisylate]   Review of Systems Review of Systems  All other systems reviewed and are negative.    Physical Exam Updated Vital Signs BP 126/86   Pulse 70   Temp 97.9 F (36.6 C)   Resp 16   SpO2 99%   Physical Exam  Constitutional: He is oriented to person, place, and time. He appears  well-developed and well-nourished.  Non-toxic appearance. No distress.  HENT:  Head: Normocephalic and atraumatic.  Mouth/Throat: Oropharynx is clear and moist and mucous membranes are normal.  Eyes: Pupils are equal, round, and reactive to light. Conjunctivae, EOM and lids are normal.  Neck: Normal range of motion. Neck supple. No tracheal deviation present. No thyroid mass present.  Cardiovascular: Normal rate, regular rhythm and normal heart sounds. Exam reveals no gallop.  No murmur heard. Pulmonary/Chest: Effort normal. No stridor. No respiratory distress. He has decreased breath sounds in the right lower field and the left lower field. He has no wheezes.  He has no rhonchi. He has no rales.  Abdominal: Soft. Normal appearance and bowel sounds are normal. He exhibits no distension. There is no tenderness. There is no rebound and no CVA tenderness.  Musculoskeletal: Normal range of motion. He exhibits no edema or tenderness.  Neurological: He is alert and oriented to person, place, and time. He has normal strength. No cranial nerve deficit or sensory deficit. GCS eye subscore is 4. GCS verbal subscore is 5. GCS motor subscore is 6.  Skin: Skin is warm and dry. No abrasion and no rash noted.  Psychiatric: He has a normal mood and affect. His speech is normal and behavior is normal.  Nursing note and vitals reviewed.    ED Treatments / Results  Labs (all labs ordered are listed, but only abnormal results are displayed) Labs Reviewed - No data to display  EKG None  Radiology No results found.  Procedures Procedures (including critical care time)  Medications Ordered in ED Medications  albuterol (PROVENTIL) (2.5 MG/3ML) 0.083% nebulizer solution 5 mg (has no administration in time range)     Initial Impression / Assessment and Plan / ED Course  I have reviewed the triage vital signs and the nursing notes.  Pertinent labs & imaging results that were available during my care of the patient were reviewed by me and considered in my medical decision making (see chart for details).     Patient given Jerilynn Somessalon Perles here for his cough.  Chest x-ray without acute findings.  Also given albuterol treatment he does feel better.  Suspect viral bronchitis and will discharge home with appropriate medications.  Final Clinical Impressions(s) / ED Diagnoses   Final diagnoses:  Cough    ED Discharge Orders    None       Lorre NickAllen, Tylique Aull, MD 02/18/18 1015

## 2018-04-23 ENCOUNTER — Emergency Department (HOSPITAL_COMMUNITY)
Admission: EM | Admit: 2018-04-23 | Discharge: 2018-04-24 | Disposition: A | Discharge status: Home / Self Care | Payer: BLUE CROSS/BLUE SHIELD | Attending: Emergency Medicine | Admitting: Emergency Medicine

## 2018-04-23 ENCOUNTER — Encounter (HOSPITAL_COMMUNITY): Payer: Self-pay

## 2018-04-23 ENCOUNTER — Emergency Department (HOSPITAL_COMMUNITY): Payer: BLUE CROSS/BLUE SHIELD

## 2018-04-23 DIAGNOSIS — R109 Unspecified abdominal pain: Secondary | ICD-10-CM

## 2018-04-23 DIAGNOSIS — R1031 Right lower quadrant pain: Secondary | ICD-10-CM | POA: Insufficient documentation

## 2018-04-23 DIAGNOSIS — Z79899 Other long term (current) drug therapy: Secondary | ICD-10-CM | POA: Insufficient documentation

## 2018-04-23 LAB — LIPASE, BLOOD: LIPASE: 42 U/L (ref 11–51)

## 2018-04-23 LAB — COMPREHENSIVE METABOLIC PANEL
ALT: 52 U/L — AB (ref 0–44)
AST: 31 U/L (ref 15–41)
Albumin: 4.3 g/dL (ref 3.5–5.0)
Alkaline Phosphatase: 62 U/L (ref 38–126)
Anion gap: 9 (ref 5–15)
BUN: 11 mg/dL (ref 6–20)
CHLORIDE: 103 mmol/L (ref 98–111)
CO2: 26 mmol/L (ref 22–32)
CREATININE: 1.06 mg/dL (ref 0.61–1.24)
Calcium: 8.7 mg/dL — ABNORMAL LOW (ref 8.9–10.3)
GFR calc Af Amer: >60 mL/min (ref >60)
Glucose, Bld: 88 mg/dL (ref 70–99)
Potassium: 3.8 mmol/L (ref 3.5–5.1)
SODIUM: 138 mmol/L (ref 135–145)
Total Bilirubin: 0.6 mg/dL (ref 0.3–1.2)
Total Protein: 7.4 g/dL (ref 6.5–8.1)

## 2018-04-23 LAB — CBC
HEMATOCRIT: 49.5 % (ref 39.0–52.0)
Hemoglobin: 16.8 g/dL (ref 13.0–17.0)
MCH: 30.1 pg (ref 26.0–34.0)
MCHC: 33.9 g/dL (ref 30.0–36.0)
MCV: 88.7 fL (ref 80.0–100.0)
Platelets: 237 10*3/uL (ref 150–400)
RBC: 5.58 MIL/uL (ref 4.22–5.81)
RDW: 13 % (ref 11.5–15.5)
WBC: 10.1 10*3/uL (ref 4.0–10.5)
nRBC: 0 % (ref 0.0–0.2)

## 2018-04-23 MED ORDER — DICYCLOMINE HCL 10 MG PO CAPS
10.0000 mg | ORAL_CAPSULE | Freq: Once | ORAL | Status: AC
  Administered 2018-04-24: 10 mg via ORAL
  Filled 2018-04-23: qty 1

## 2018-04-23 NOTE — ED Provider Notes (Signed)
[facility]-EMERGENCY DEPT Provider Note   CSN: [id number] Arrival date & time: 04/23/18  1951     History   Chief Complaint Chief Complaint  Patient presents with  . Abdominal Pain    HPI Grier Mittsimothy E Mcomber is a 48 y.o. male.  48 year old male with history of esophageal reflux, mitral valve prolapse presents to the emergency department for evaluation of abdominal pain.  He states that he has been having a constant, dull ache in his right lower quadrant for the past week.  Denies any modifying factors of his symptoms.  Feels that his pain slightly radiates into his right groin and thigh.  He describes the discomfort in his thigh as a "burning" discomfort.  He has been experiencing diarrhea characterized as looser, watery stool, for the past 2 days.  Denies any melena, hematochezia, vomiting, dysuria, scrotal swelling, testicular tenderness, penile discharge, fevers.  Abdominal surgical history significant for appendectomy.  No medications taken PTA for symptoms.  The history is provided by the patient. No language interpreter was used.  Abdominal Pain    Past Medical History:  Diagnosis Date  . GERD (gastroesophageal reflux disease)   . Headache(784.0)   . History of stomach ulcers   . Lateral meniscus tear    Right  . Mallory - Weiss tear   . Migraine with aura   . MVP (mitral valve prolapse)   . Rectal bleeding     Patient Active Problem List   Diagnosis Date Noted  . Back pain 10/25/2012  . URI (upper respiratory infection) 03/13/2012  . 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 GERD (gastroesophageal reflux disease)   . History of stomach ulcers   . Anxiety 09/07/2011  . Hemorrhoids 09/01/2011  . Renal cyst 08/23/2011  . Abdominal pain, other specified site 08/17/2011  . Hematemesis/vomiting blood 08/17/2011  . Headache(784.0) 08/12/2011    Past Surgical History:  Procedure Laterality Date  . APPENDECTOMY  1990's  . ELBOW SURGERY  1987   Right  .  ESOPHAGOGASTRODUODENOSCOPY  08/18/2011   Procedure: ESOPHAGOGASTRODUODENOSCOPY (EGD);  Surgeon: Beverley FiedlerJay M Pyrtle, MD;  Location: [facility];  Service: Gastroenterology;  Laterality: N/A;  . ESOPHAGOGASTRODUODENOSCOPY  11/11/2011   Procedure: ESOPHAGOGASTRODUODENOSCOPY (EGD);  Surgeon: Iva Booparl E Gessner, MD;  Location: [facility];  Service: Endoscopy;  Laterality: N/A;  . KNEE ARTHROSCOPY     Right  . SHOULDER SURGERY  2012   right rotator cuff        Home Medications    Prior to Admission medications   Medication Sig Start Date End Date Taking? Authorizing Provider  Aspirin-Acetaminophen-Caffeine (GOODY HEADACHE PO) Take 1 each by mouth every 8 (eight) hours as needed (pain).   Yes [provider]  albuterol (PROVENTIL HFA;VENTOLIN HFA) 108 (90 Base) MCG/ACT inhaler Inhale 1-2 puffs into the lungs every 6 (six) hours as needed for wheezing or shortness of breath. Patient not taking: Reported on 04/23/2018 02/18/18   Lorre NickAllen, Anthony, MD  benzonatate (TESSALON) 100 MG capsule Take 1 capsule (100 mg total) by mouth every 8 (eight) hours. Patient not taking: Reported on 04/23/2018 02/18/18   Lorre NickAllen, Anthony, MD  predniSONE (DELTASONE) 50 MG tablet 1 p.o. daily x5 Patient not taking: Reported on 04/23/2018 02/18/18   Lorre NickAllen, Anthony, MD    Family History Family History  Problem Relation Age of Onset  . Hypertension Mother   . Stroke Mother   . Migraines Mother   . Diabetes Mother   . Hypertension Father   . Stroke Father   .  Heart failure Father   . Asthma Father   . Colon polyps Father   . Colon cancer Neg Hx   . Prostate cancer Neg Hx   . Esophageal cancer Neg Hx   . Stomach cancer Neg Hx     Social History Social History   Tobacco Use  . Smoking status: Never Smoker  . Smokeless tobacco: Never Used  Substance Use Topics  . Alcohol use: No  . Drug use: No     Allergies   Codeine; Hydrocodone-acetaminophen; Food; Ibuprofen; and Compazine [prochlorperazine  edisylate]   Review of Systems Review of Systems  Gastrointestinal: Positive for abdominal pain.  Ten systems reviewed and are negative for acute change, except as noted in the HPI.    Physical Exam Updated Vital Signs BP 138/71 (BP Location: Left Arm)   Pulse 60   Temp 97.7 F (36.5 C) (Oral)   Resp 17   Ht 5\' 7"  (1.702 m)   Wt 79.4 kg   SpO2 96%   BMI 27.41 kg/m   Physical Exam Vitals signs and nursing note reviewed.  Constitutional:      General: He is not in acute distress.    Appearance: He is well-developed. He is not diaphoretic.     Comments: Nontoxic appearing and in NAD  HENT:     Head: Normocephalic and atraumatic.  Eyes:     General: No scleral icterus.    Conjunctiva/sclera: Conjunctivae normal.  Neck:     Musculoskeletal: Normal range of motion.  Cardiovascular:     Rate and Rhythm: Normal rate and regular rhythm.     Pulses: Normal pulses.  Pulmonary:     Effort: Pulmonary effort is normal. No respiratory distress.     Breath sounds: No stridor. No wheezing.     Comments: Respirations even and unlabored. Lungs CTAB. Abdominal:     Comments: Obese, nontender abdomen. No peritoneal signs.  Musculoskeletal: Normal range of motion.  Skin:    General: Skin is warm and dry.     Coloration: Skin is not pale.     Findings: No erythema or rash.  Neurological:     Mental Status: He is alert and oriented to person, place, and time.     Coordination: Coordination normal.  Psychiatric:        Behavior: Behavior normal.      ED Treatments / Results  Labs (all labs ordered are listed, but only abnormal results are displayed) Labs Reviewed  COMPREHENSIVE METABOLIC PANEL - Abnormal; Notable for the following components:      Result Value   Calcium 8.7 (*)    ALT 52 (*)    All other components within normal limits  LIPASE, BLOOD  CBC    EKG None  Radiology Dg Abd 2 Views  Result Date: 04/23/2018 CLINICAL DATA:  Dull abdominal pain EXAM:  ABDOMEN - 2 VIEW COMPARISON:  05/16/2012 FINDINGS: Lung bases are clear. No free air beneath the diaphragm. Nonobstructed bowel-gas pattern with moderate stool. No radiopaque calculi. IMPRESSION: Nonobstructed gas pattern with moderate stool Electronically Signed   By: Jasmine Pang M.D.   On: 04/23/2018 23:33    Procedures Procedures (including critical care time)  Medications Ordered in ED Medications  dicyclomine (BENTYL) capsule 10 mg (10 mg Oral Given 04/24/18 0008)     1:50 AM Patient reassessed.  He states that he is unable to urinate.  Is not willing to wait in the emergency department for completion of work-up.  Given chronicity of discomfort,  I not feel discharge at this time is unreasonable.  Have discussed that his symptoms may be due to constipation as there is some increased stool burden noted in the a sending colon which is in proximity to the patient's discomfort.  Discussed home use of Tylenol and MiraLAX.   Initial Impression / Assessment and Plan / ED Course  I have reviewed the triage vital signs and the nursing notes.  Pertinent labs & imaging results that were available during my care of the patient were reviewed by me and considered in my medical decision making (see chart for details).     48 year old male presents to the emergency department for evaluation of 1 week of constant, dull pain in his right lower quadrant.  He has a history of appendectomy and no reproducible abdominal tenderness on exam.  No peritoneal signs.    Laboratory evaluation reassuring without leukocytosis, anemia, electrolyte derangements.  Liver and kidney function preserved.  Lipase normal.  Reports some looser stool over the past 2 days, but x-ray is suggestive of moderate stool burden.  Most of retained stool appears to be in the a sending colon which may be contributing to patient's discomfort.  The patient has been unable to provide a urine sample.  Denies any difficulty voiding.  The  patient is unwilling to remain in the emergency department until able to provide a urine specimen.  Given chronicity of his pain, I have a low suspicion for emergent process.  He has been encouraged to use Tylenol and MiraLAX and to follow-up with a primary care doctor.  Return precautions discussed and provided. Patient discharged in stable condition with no unaddressed concerns.  Vitals:   04/23/18 2006 04/23/18 2007 04/24/18 0009 04/24/18 0156  BP: (!) 135/99  135/81 138/71  Pulse: 74  (!) 58 60  Resp: 16  17   Temp: 97.7 F (36.5 C)     TempSrc: Oral     SpO2: 100%  99% 96%  Weight:  79.4 kg    Height:  5\' 7"  (1.702 m)      Final Clinical Impressions(s) / ED Diagnoses   Final diagnoses:  Abdominal pain  Right lower quadrant abdominal pain    ED Discharge Orders    None       Antony Madura, PA-C 04/24/18 0247    Loren Racer, MD 04/27/18 2124

## 2018-04-23 NOTE — ED Triage Notes (Signed)
Pt arrived with complaints of right sided abdominal pain since last Monday. Pt reports having his appendix removed. Pt states he has also had diarrhea for the last two day.

## 2018-04-24 NOTE — ED Notes (Signed)
Pt. Reminded to collect urine for urinalysis, attempted twice. Pt. Stated he "still don't feel like I can urinate at this time. " pt. Had two cups of ice water already.

## 2018-04-24 NOTE — Discharge Instructions (Signed)
Your work-up in the emergency department has been reassuring.  We recommend the use of Tylenol for management of ongoing discomfort.  You may benefit from use of daily MiraLAX to promote continued soft stool.  This can be purchased over-the-counter at your local pharmacy.  Drink plenty of water to prevent dehydration.  Follow-up with a primary doctor.

## 2018-04-24 NOTE — ED Notes (Addendum)
Pts wife came to the nurses station stating this nurse needs to come in right now. When entering the room patient began yelling that he wants to go home, saying he cannot pee so will not give a urine sample and he is tired of being here that I need to get someone in now. This nurse explained that I am not the primary nurse but I will speak with his nurse and PA about wanting to leave. Lilibeth made aware and PA Tresa Endo on way to speak with patient.

## 2018-09-03 ENCOUNTER — Ambulatory Visit (HOSPITAL_COMMUNITY)
Admission: EM | Admit: 2018-09-03 | Discharge: 2018-09-03 | Disposition: A | Discharge status: Home / Self Care | Payer: BC Managed Care – PPO | Attending: Emergency Medicine | Admitting: Emergency Medicine

## 2018-09-03 ENCOUNTER — Encounter (HOSPITAL_COMMUNITY): Payer: Self-pay | Admitting: *Deleted

## 2018-09-03 ENCOUNTER — Other Ambulatory Visit: Payer: Self-pay

## 2018-09-03 ENCOUNTER — Emergency Department (HOSPITAL_COMMUNITY): Payer: BC Managed Care – PPO

## 2018-09-03 ENCOUNTER — Encounter (HOSPITAL_COMMUNITY)
Admission: EM | Disposition: A | Discharge status: Home / Self Care | Payer: Self-pay | Source: Home / Self Care | Attending: Emergency Medicine

## 2018-09-03 ENCOUNTER — Telehealth: Payer: Self-pay

## 2018-09-03 ENCOUNTER — Emergency Department (HOSPITAL_COMMUNITY): Payer: BC Managed Care – PPO | Admitting: Registered Nurse

## 2018-09-03 DIAGNOSIS — K209 Esophagitis, unspecified: Secondary | ICD-10-CM | POA: Diagnosis not present

## 2018-09-03 DIAGNOSIS — Z1159 Encounter for screening for other viral diseases: Secondary | ICD-10-CM | POA: Diagnosis not present

## 2018-09-03 DIAGNOSIS — K210 Gastro-esophageal reflux disease with esophagitis: Secondary | ICD-10-CM | POA: Diagnosis not present

## 2018-09-03 DIAGNOSIS — K228 Other specified diseases of esophagus: Secondary | ICD-10-CM | POA: Diagnosis not present

## 2018-09-03 DIAGNOSIS — Z03818 Encounter for observation for suspected exposure to other biological agents ruled out: Secondary | ICD-10-CM | POA: Diagnosis not present

## 2018-09-03 DIAGNOSIS — K2980 Duodenitis without bleeding: Secondary | ICD-10-CM | POA: Insufficient documentation

## 2018-09-03 DIAGNOSIS — T18128A Food in esophagus causing other injury, initial encounter: Secondary | ICD-10-CM | POA: Diagnosis not present

## 2018-09-03 DIAGNOSIS — X58XXXA Exposure to other specified factors, initial encounter: Secondary | ICD-10-CM | POA: Diagnosis not present

## 2018-09-03 DIAGNOSIS — K3189 Other diseases of stomach and duodenum: Secondary | ICD-10-CM | POA: Diagnosis not present

## 2018-09-03 DIAGNOSIS — R079 Chest pain, unspecified: Secondary | ICD-10-CM | POA: Diagnosis not present

## 2018-09-03 DIAGNOSIS — R072 Precordial pain: Secondary | ICD-10-CM | POA: Diagnosis present

## 2018-09-03 DIAGNOSIS — T18108A Unspecified foreign body in esophagus causing other injury, initial encounter: Secondary | ICD-10-CM | POA: Diagnosis not present

## 2018-09-03 HISTORY — PX: BIOPSY: SHX5522

## 2018-09-03 HISTORY — PX: ESOPHAGOGASTRODUODENOSCOPY (EGD) WITH PROPOFOL: SHX5813

## 2018-09-03 LAB — COMPREHENSIVE METABOLIC PANEL
ALT: 56 U/L — ABNORMAL HIGH (ref 0–44)
AST: 32 U/L (ref 15–41)
Albumin: 4.8 g/dL (ref 3.5–5.0)
Alkaline Phosphatase: 72 U/L (ref 38–126)
Anion gap: 11 (ref 5–15)
BUN: 16 mg/dL (ref 6–20)
CO2: 26 mmol/L (ref 22–32)
Calcium: 9.4 mg/dL (ref 8.9–10.3)
Chloride: 102 mmol/L (ref 98–111)
Creatinine, Ser: 1.06 mg/dL (ref 0.61–1.24)
GFR calc Af Amer: >60 mL/min (ref >60)
GFR calc non Af Amer: >60 mL/min (ref >60)
Glucose, Bld: 103 mg/dL — ABNORMAL HIGH (ref 70–99)
Potassium: 4.5 mmol/L (ref 3.5–5.1)
Sodium: 139 mmol/L (ref 135–145)
Total Bilirubin: 0.5 mg/dL (ref 0.3–1.2)
Total Protein: 8.3 g/dL — ABNORMAL HIGH (ref 6.5–8.1)

## 2018-09-03 LAB — CBC WITH DIFFERENTIAL/PLATELET
Abs Immature Granulocytes: 0.04 10*3/uL (ref 0.00–0.07)
Basophils Absolute: 0 10*3/uL (ref 0.0–0.1)
Basophils Relative: 0 %
Eosinophils Absolute: 0.3 10*3/uL (ref 0.0–0.5)
Eosinophils Relative: 3 %
HCT: 52.8 % — ABNORMAL HIGH (ref 39.0–52.0)
Hemoglobin: 18.1 g/dL — ABNORMAL HIGH (ref 13.0–17.0)
Immature Granulocytes: 0 %
Lymphocytes Relative: 15 %
Lymphs Abs: 1.6 10*3/uL (ref 0.7–4.0)
MCH: 30.2 pg (ref 26.0–34.0)
MCHC: 34.3 g/dL (ref 30.0–36.0)
MCV: 88.1 fL (ref 80.0–100.0)
Monocytes Absolute: 0.7 10*3/uL (ref 0.1–1.0)
Monocytes Relative: 6 %
Neutro Abs: 8.2 10*3/uL — ABNORMAL HIGH (ref 1.7–7.7)
Neutrophils Relative %: 76 %
Platelets: 268 10*3/uL (ref 150–400)
RBC: 5.99 MIL/uL — ABNORMAL HIGH (ref 4.22–5.81)
RDW: 13.2 % (ref 11.5–15.5)
WBC: 10.9 10*3/uL — ABNORMAL HIGH (ref 4.0–10.5)
nRBC: 0 % (ref 0.0–0.2)

## 2018-09-03 LAB — LIPASE, BLOOD: Lipase: 27 U/L (ref 11–51)

## 2018-09-03 LAB — SARS CORONAVIRUS 2 BY RT PCR (HOSPITAL ORDER, PERFORMED IN ~~LOC~~ HOSPITAL LAB): SARS Coronavirus 2: NEGATIVE

## 2018-09-03 SURGERY — ESOPHAGOGASTRODUODENOSCOPY (EGD) WITH PROPOFOL
Anesthesia: Monitor Anesthesia Care

## 2018-09-03 MED ORDER — LIDOCAINE 2% (20 MG/ML) 5 ML SYRINGE
INTRAMUSCULAR | Status: DC | PRN
  Administered 2018-09-03: 80 mg via INTRAVENOUS

## 2018-09-03 MED ORDER — PROPOFOL 10 MG/ML IV BOLUS
INTRAVENOUS | Status: DC | PRN
  Administered 2018-09-03: 200 mg via INTRAVENOUS

## 2018-09-03 MED ORDER — ONDANSETRON HCL 4 MG/2ML IJ SOLN
INTRAMUSCULAR | Status: DC | PRN
  Administered 2018-09-03: 4 mg via INTRAVENOUS

## 2018-09-03 MED ORDER — GLUCAGON HCL RDNA (DIAGNOSTIC) 1 MG IJ SOLR
1.0000 mg | Freq: Once | INTRAMUSCULAR | Status: AC
  Administered 2018-09-03: 08:00:00 1 mg via INTRAVENOUS
  Filled 2018-09-03: qty 1

## 2018-09-03 MED ORDER — DEXAMETHASONE SODIUM PHOSPHATE 10 MG/ML IJ SOLN
INTRAMUSCULAR | Status: DC | PRN
  Administered 2018-09-03: 10 mg via INTRAVENOUS

## 2018-09-03 MED ORDER — LACTATED RINGERS IV SOLN
INTRAVENOUS | Status: DC
  Administered 2018-09-03: 1000 mL via INTRAVENOUS
  Administered 2018-09-03: 15:00:00 via INTRAVENOUS

## 2018-09-03 MED ORDER — ROCURONIUM BROMIDE 10 MG/ML (PF) SYRINGE
PREFILLED_SYRINGE | INTRAVENOUS | Status: DC | PRN
  Administered 2018-09-03: 20 mg via INTRAVENOUS

## 2018-09-03 MED ORDER — SODIUM CHLORIDE 0.9 % IV SOLN: INTRAVENOUS | Status: DC

## 2018-09-03 MED ORDER — FENTANYL CITRATE (PF) 100 MCG/2ML IJ SOLN
50.0000 ug | Freq: Once | INTRAMUSCULAR | Status: AC
  Administered 2018-09-03: 08:00:00 50 ug via INTRAVENOUS
  Filled 2018-09-03: qty 2

## 2018-09-03 MED ORDER — ONDANSETRON HCL 4 MG/2ML IJ SOLN
4.0000 mg | Freq: Once | INTRAMUSCULAR | Status: AC
  Administered 2018-09-03: 08:00:00 4 mg via INTRAVENOUS
  Filled 2018-09-03: qty 2

## 2018-09-03 MED ORDER — SUGAMMADEX SODIUM 200 MG/2ML IV SOLN
INTRAVENOUS | Status: DC | PRN
  Administered 2018-09-03: 500 mg via INTRAVENOUS

## 2018-09-03 MED ORDER — PANTOPRAZOLE SODIUM 40 MG PO TBEC: 40.0000 mg | DELAYED_RELEASE_TABLET | Freq: Every day | ORAL | 11 refills | Status: DC

## 2018-09-03 MED ORDER — SUCCINYLCHOLINE CHLORIDE 200 MG/10ML IV SOSY
PREFILLED_SYRINGE | INTRAVENOUS | Status: DC | PRN
  Administered 2018-09-03: 120 mg via INTRAVENOUS

## 2018-09-03 MED ORDER — PROPOFOL 10 MG/ML IV BOLUS
INTRAVENOUS | Status: AC
  Filled 2018-09-03: qty 20

## 2018-09-03 SURGICAL SUPPLY — 15 items

## 2018-09-03 NOTE — Anesthesia Postprocedure Evaluation (Signed)
Anesthesia Post Note  Patient: Bobby Mcbride  Procedure(s) Performed: ESOPHAGOGASTRODUODENOSCOPY (EGD) WITH PROPOFOL (N/A ) BIOPSY     Patient location during evaluation: PACU Anesthesia Type: MAC Level of consciousness: awake and alert Pain management: pain level controlled Vital Signs Assessment: post-procedure vital signs reviewed and stable Respiratory status: spontaneous breathing, nonlabored ventilation, respiratory function stable and patient connected to nasal cannula oxygen Cardiovascular status: stable and blood pressure returned to baseline Postop Assessment: no apparent nausea or vomiting Anesthetic complications: no    Last Vitals:  Vitals:   09/03/18 1535 09/03/18 1550  BP: (!) 89/47 (!) 125/51  Pulse: 64 69  Resp: (!) 22 12  Temp:    SpO2: 100% 97%    Last Pain:  Vitals:   09/03/18 1550  TempSrc:   PainSc: 0-No pain                 Laroy Mustard

## 2018-09-03 NOTE — ED Provider Notes (Signed)
[facility] DEPT Provider Note   CSN: [id number] Arrival date & time: 09/03/18  8338    History   Chief Complaint Chief Complaint  Patient presents with  . possible food bolus    HPI Bobby Mcbride is a 48 y.o. male.  With a past medical history of reflux, previous Mallory-Weiss tear, EGD in 2013 that showed Mallory-Weiss tear, gastritis and duodenitis.  Patient states that yesterday around 5 PM he was eating steak and sweet potatoes.  He swallowed and felt like it got stuck.  He has pain retrosternally in the mid lower sternal region.  He feels that there is something stuck there.  He vomited once and did get a chunk of steak up but states that anytime he tries to swallow saliva or liquids he vomits.  He has persistent pain and discomfort in this area.  He has never had a food bolus impaction previously.  He normally just takes Tums for his reflux however review of EMR shows that he was prescribed PPI by PA Aline Brochure.  He denies any shortness of breath but states that he feels extremely uncomfortable when he tries to lie flat and has been unable to sleep since last night.  He denies active chest pain.  He denies diaphoresis.  He denies pain that radiates into his jaw or arm.     HPI  Past Medical History:  Diagnosis Date  . GERD (gastroesophageal reflux disease)   . Headache(784.0)   . History of stomach ulcers   . Lateral meniscus tear    Right  . Mallory - Weiss tear   . Migraine with aura   . MVP (mitral valve prolapse)   . Rectal bleeding     Patient Active Problem List   Diagnosis Date Noted  . Back pain 10/25/2012  . URI (upper respiratory infection) 03/13/2012  . 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 GERD (gastroesophageal reflux disease)   . History of stomach ulcers   . Anxiety 09/07/2011  . Hemorrhoids 09/01/2011  . Renal cyst 08/23/2011  . Abdominal pain, other specified site 08/17/2011  . Hematemesis/vomiting blood 08/17/2011  .  Headache(784.0) 08/12/2011    Past Surgical History:  Procedure Laterality Date  . APPENDECTOMY  1990's  . ELBOW SURGERY  1987   Right  . ESOPHAGOGASTRODUODENOSCOPY  08/18/2011   Procedure: ESOPHAGOGASTRODUODENOSCOPY (EGD);  Surgeon: Jerene Bears, MD;  Location: [facility];  Service: Gastroenterology;  Laterality: N/A;  . ESOPHAGOGASTRODUODENOSCOPY  11/11/2011   Procedure: ESOPHAGOGASTRODUODENOSCOPY (EGD);  Surgeon: Gatha Mayer, MD;  Location: [facility];  Service: Endoscopy;  Laterality: N/A;  . KNEE ARTHROSCOPY     Right  . SHOULDER SURGERY  2012   right rotator cuff        Home Medications    Prior to Admission medications   Medication Sig Start Date End Date Taking? Authorizing Provider  albuterol (PROVENTIL HFA;VENTOLIN HFA) 108 (90 Base) MCG/ACT inhaler Inhale 1-2 puffs into the lungs every 6 (six) hours as needed for wheezing or shortness of breath. Patient not taking: Reported on 04/23/2018 02/18/18   Lacretia Leigh, MD  Aspirin-Acetaminophen-Caffeine (GOODY HEADACHE PO) Take 1 each by mouth every 8 (eight) hours as needed (pain).    [provider]  benzonatate (TESSALON) 100 MG capsule Take 1 capsule (100 mg total) by mouth every 8 (eight) hours. Patient not taking: Reported on 04/23/2018 02/18/18   Lacretia Leigh, MD  predniSONE (DELTASONE) 50 MG tablet 1 p.o. daily x5 Patient not taking: Reported on  04/23/2018 02/18/18   Lorre NickAllen, Anthony, MD    Family History Family History  Problem Relation Age of Onset  . Hypertension Mother   . Stroke Mother   . Migraines Mother   . Diabetes Mother   . Hypertension Father   . Stroke Father   . Heart failure Father   . Asthma Father   . Colon polyps Father   . Colon cancer Neg Hx   . Prostate cancer Neg Hx   . Esophageal cancer Neg Hx   . Stomach cancer Neg Hx     Social History Social History   Tobacco Use  . Smoking status: Never Smoker  . Smokeless tobacco: Never Used  Substance Use Topics  . Alcohol  use: No  . Drug use: No     Allergies   Codeine, Hydrocodone-acetaminophen, Food, Ibuprofen, and Compazine [prochlorperazine edisylate]   Review of Systems Review of Systems Ten systems reviewed and are negative for acute change, except as noted in the HPI.    Physical Exam Updated Vital Signs BP 140/89   Pulse 78   Temp 98.7 F (37.1 C) (Oral)   Resp 13   SpO2 99%   Physical Exam Vitals signs and nursing note reviewed.  Constitutional:      General: He is not in acute distress.    Appearance: He is well-developed. He is not diaphoretic.     Comments: Appears uncomfortable  HENT:     Head: Normocephalic and atraumatic.  Eyes:     General: No scleral icterus.    Conjunctiva/sclera: Conjunctivae normal.  Neck:     Musculoskeletal: Normal range of motion and neck supple.  Cardiovascular:     Rate and Rhythm: Normal rate and regular rhythm.     Heart sounds: Normal heart sounds.  Pulmonary:     Effort: Pulmonary effort is normal. No respiratory distress.     Breath sounds: Normal breath sounds.  Abdominal:     Palpations: Abdomen is soft.     Tenderness: There is no abdominal tenderness.  Skin:    General: Skin is warm and dry.  Neurological:     Mental Status: He is alert.  Psychiatric:        Behavior: Behavior normal.     Comments: Anxious      ED Treatments / Results  Labs (all labs ordered are listed, but only abnormal results are displayed) Labs Reviewed  CBC WITH DIFFERENTIAL/PLATELET - Abnormal; Notable for the following components:      Result Value   WBC 10.9 (*)    RBC 5.99 (*)    Hemoglobin 18.1 (*)    HCT 52.8 (*)    Neutro Abs 8.2 (*)    All other components within normal limits  COMPREHENSIVE METABOLIC PANEL  LIPASE, BLOOD    EKG None   Radiology Dg Chest 2 View  Result Date: 09/03/2018 CLINICAL DATA:  Chest pain.  Nausea vomiting. EXAM: CHEST - 2 VIEW COMPARISON:  02/18/2018. FINDINGS: Mediastinum and hilar structures normal.  Heart size normal. No focal infiltrate. No pleural effusion or pneumothorax. Thoracic spine scoliosis. IMPRESSION: No acute cardiopulmonary disease. Electronically Signed   By: Maisie Fushomas  Register   On: 09/03/2018 07:55    Procedures Procedures (including critical care time)  Medications Ordered in ED Medications  ondansetron [facility](ZOFRAN) injection 4 mg (4 mg Intravenous Given 09/03/18 0807)  fentaNYL (SUBLIMAZE) injection 50 mcg (50 mcg Intravenous Given 09/03/18 0807)  glucagon (human recombinant) (GLUCAGEN) injection 1 mg (1 mg Intravenous Given 09/03/18  [id number])     Initial Impression / Assessment and Plan / ED Course  I have reviewed the triage vital signs and the nursing notes.  Pertinent labs & imaging results that were available during my care of the patient were reviewed by me and considered in my medical decision making (see chart for details).  Clinical Course as of Sep 03 1550  Mon Sep 03, 2018  0926 Patient given glucagon about 1 hour ago with no improvement in symptoms.  He has not had vomiting but still feels significant discomfort and pain in the central sternal region.   [AH]  1031 I spoke with GI who will take the patient for EGD. I have updated the patient who is waiting for procedure. He is not vomiting.   [AH]    Clinical Course User Index [AH] Arthor CaptainHarris, Alison Kubicki, PA-C       48 year old male here with retrosternal pain and esophageal discomfort after eating steak last night.  Initial diagnosis includes ACS, esophagitis, esophageal spasm, food bolus impaction.  I personally reviewed the 2 view chest x-ray and see no pneumo mediastinum or widening of the mediastinum.  There is no obvious consolidations or other abnormalities. Patient will receive CBC, CMP, lipase.  I personally reviewed the patient's EKG which shows no acute signs of ischemia. He is receiving fluids, antiemetics, pain medication and a bolus of glucagon.  If this fails I will consult with GI.   Patient without  improvement after use of glucagon.  I spoke with the PA on-call for low-power gastroenterology.  Patient will be taken to the endoscopy suite for upper EGD for presumed food bolus impaction.  He has been stable throughout his ED visit.  I have updated the patient on his clinical course and plan of care. Final Clinical Impressions(s) / ED Diagnoses   Final diagnoses:  None    ED Discharge Orders    None       Arthor CaptainHarris, Tamaiya Bump, PA-C 09/03/18 1553    Samuel JesterMcManus, Kathleen, DO 09/06/18 1057

## 2018-09-03 NOTE — ED Notes (Signed)
Patient transported to Endo. Patient to be d/c'd from Endo.

## 2018-09-03 NOTE — Consult Note (Addendum)
Consultation  Referring Provider: ER Tiburcio Pea/Harris PA-C Primary Care Physician:  Patient, No Pcp Per Primary Gastroenterologist:  Dr.Jacobs  Reason for Consultation:  Food impaction  HPI: Bobby Mcbride is a 48 y.o. male known to Dr. Christella HartiganJacobs, he presented to the emergency room this morning kneeling as if he has a piece of steak stuck in his esophagus.  He says he ate steak for dinner last night at about 5 PM and felt it become lodged in his esophagus.  He tried to regurgitate and was able to bring up 1 piece of steak but continued to have sensation as if there was still something stuck.  He was very uncomfortable all night was not able to sleep due to lower chest discomfort.  He has not been vomiting, off and on bringing up mucus and some saliva but has not been spitting into a cup. Patient relates that he does have fairly regular heartburn and indigestion.  He says he was having some dysphasia symptoms when he had an endoscopy last year with Dr. Christella HartiganJacobs but did not require dilation. EGD 11/24/2017 per Dr. Christella HartiganJacobs with finding of grade a distal esophagitis, no stricture.  Patient has not been on a regular acid blocker.  He says over the past couple of months he has noted significant increase in frequency of episodes of dysphasia.  Is to be happening regularly primarily with meat and bread he has stopped eating bread.  He says he will eat a few bites feel as if something gets stuck and has to go to the bathroom and regurgitate it back up. Patient also has prior history of Mallory-Weiss tear, otherwise in generally good health.    Past Medical History:  Diagnosis Date  . GERD (gastroesophageal reflux disease)   . Headache(784.0)   . History of stomach ulcers   . Lateral meniscus tear    Right  . Mallory - Weiss tear   . Migraine with aura   . MVP (mitral valve prolapse)   . Rectal bleeding     Past Surgical History:  Procedure Laterality Date  . APPENDECTOMY  1990's  . ELBOW SURGERY  1987    Right  . ESOPHAGOGASTRODUODENOSCOPY  08/18/2011   Procedure: ESOPHAGOGASTRODUODENOSCOPY (EGD);  Surgeon: Beverley FiedlerJay M Pyrtle, MD;  Location: [facility];  Service: Gastroenterology;  Laterality: N/A;  . ESOPHAGOGASTRODUODENOSCOPY  11/11/2011   Procedure: ESOPHAGOGASTRODUODENOSCOPY (EGD);  Surgeon: Iva Booparl E Gessner, MD;  Location: [facility];  Service: Endoscopy;  Laterality: N/A;  . KNEE ARTHROSCOPY     Right  . SHOULDER SURGERY  2012   right rotator cuff    Prior to Admission medications   Medication Sig Start Date End Date Taking? Authorizing Provider  Aspirin-Acetaminophen-Caffeine (GOODY HEADACHE PO) Take 1 each by mouth every 8 (eight) hours as needed (pain).   Yes [provider]  albuterol (PROVENTIL HFA;VENTOLIN HFA) 108 (90 Base) MCG/ACT inhaler Inhale 1-2 puffs into the lungs every 6 (six) hours as needed for wheezing or shortness of breath. Patient not taking: Reported on 04/23/2018 02/18/18   Lorre NickAllen, Anthony, MD  benzonatate (TESSALON) 100 MG capsule Take 1 capsule (100 mg total) by mouth every 8 (eight) hours. Patient not taking: Reported on 04/23/2018 02/18/18   Lorre NickAllen, Anthony, MD  predniSONE (DELTASONE) 50 MG tablet 1 p.o. daily x5 Patient not taking: Reported on 04/23/2018 02/18/18   Lorre NickAllen, Anthony, MD    No current facility-administered medications for this encounter.    Current Outpatient Medications  Medication Sig Dispense Refill  .  Aspirin-Acetaminophen-Caffeine (GOODY HEADACHE PO) Take 1 each by mouth every 8 (eight) hours as needed (pain).    Marland Kitchen. albuterol (PROVENTIL HFA;VENTOLIN HFA) 108 (90 Base) MCG/ACT inhaler Inhale 1-2 puffs into the lungs every 6 (six) hours as needed for wheezing or shortness of breath. (Patient not taking: Reported on 04/23/2018) 1 Inhaler 0  . benzonatate (TESSALON) 100 MG capsule Take 1 capsule (100 mg total) by mouth every 8 (eight) hours. (Patient not taking: Reported on 04/23/2018) 21 capsule 0  . predniSONE (DELTASONE) 50 MG tablet 1 p.o.  daily x5 (Patient not taking: Reported on 04/23/2018) 5 tablet 0    Allergies as of 09/03/2018 - Review Complete 09/03/2018  Allergen Reaction Noted  . Codeine Other (See Comments)   . Hydrocodone-acetaminophen Other (See Comments)   . Food  10/18/2015  . Ibuprofen  12/28/2016  . Compazine [prochlorperazine edisylate] Anxiety 08/02/2011    Family History  Problem Relation Age of Onset  . Hypertension Mother   . Stroke Mother   . Migraines Mother   . Diabetes Mother   . Hypertension Father   . Stroke Father   . Heart failure Father   . Asthma Father   . Colon polyps Father   . Colon cancer Neg Hx   . Prostate cancer Neg Hx   . Esophageal cancer Neg Hx   . Stomach cancer Neg Hx     Social History   Socioeconomic History  . Marital status: Married    Spouse name: Not on file  . Number of children: 1  . Years of education: Not on file  . Highest education level: Not on file  Occupational History  . Occupation: IT sales professionalirefighter, since 2002    Employer: [employer]  Social Needs  . Financial resource strain: Not on file  . Food insecurity    Worry: Not on file    Inability: Not on file  . Transportation needs    Medical: Not on file    Non-medical: Not on file  Tobacco Use  . Smoking status: Never Smoker  . Smokeless tobacco: Never Used  Substance and Sexual Activity  . Alcohol use: No  . Drug use: No  . Sexual activity: Not on file  Lifestyle  . Physical activity    Days per week: Not on file    Minutes per session: Not on file  . Stress: Not on file  Relationships  . Social Musicianconnections    Talks on phone: Not on file    Gets together: Not on file    Attends religious service: Not on file    Active member of club or organization: Not on file    Attends meetings of clubs or organizations: Not on file    Relationship status: Not on file  . Intimate partner violence    Fear of current or ex partner: Not on file    Emotionally abused: Not on file     Physically abused: Not on file    Forced sexual activity: Not on file  Other Topics Concern  . Not on file  Social History Narrative  . Not on file    Review of Systems: Pertinent positive and negative review of systems were noted in the above HPI section.  All other review of systems was otherwise negative.  Physical Exam: Vital signs in last 24 hours: Temp:  [98.7 F (37.1 C)] 98.7 F (37.1 C) (06/22 0641) Pulse Rate:  [56-82] 56 (06/22 1115) Resp:  [12-16] 14 (06/22 1115) BP: (  116-141)/(80-125) 121/86 (06/22 1115) SpO2:  [95 %-99 %] 98 % (06/22 1115)   General:   Alert,  Well-developed, well-nourished, WM, anxious,pleasant and cooperative in NAD Head:  Normocephalic and atraumatic. Eyes:  Sclera clear, no icterus.   Conjunctiva pink. Ears:  Normal auditory acuity. Nose:  No deformity, discharge,  or lesions. Mouth:  No deformity or lesions.   Neck:  Supple; no masses or thyromegaly. Lungs:  Clear throughout to auscultation.   No wheezes, crackles, or rhonchi. Heart:  Regular rate and rhythm; no murmurs, clicks, rubs,  or gallops. Abdomen:  Soft,nontender, BS active,nonpalp mass or hsm.   Rectal:  Deferred  Msk:  Symmetrical without gross deformities. . Pulses:  Normal pulses noted. Extremities:  Without clubbing or edema. Neurologic:  Alert and  oriented x4;  grossly normal neurologically. Skin:  Intact without significant lesions or rashes.. Psych:  Alert and cooperative. Normal mood and affect.  Intake/Output from previous day: No intake/output data recorded. Intake/Output this shift: No intake/output data recorded.  Lab Results: Recent Labs    09/03/18 0808  WBC 10.9*  HGB 18.1*  HCT 52.8*  PLT 268   BMET Recent Labs    09/03/18 0808  NA 139  K 4.5  CL 102  CO2 26  GLUCOSE 103*  BUN 16  CREATININE 1.06  CALCIUM 9.4   LFT Recent Labs    09/03/18 0808  PROT 8.3*  ALBUMIN 4.8  AST 32  ALT 56*  ALKPHOS 72  BILITOT 0.5   PT/INR No results  for input(s): LABPROT, INR in the last 72 hours. Hepatitis Panel No results for input(s): HEPBSAG, HCVAB, HEPAIGM, HEPBIGM in the last 72 hours.   IMPRESSION:  #105 48 year old white male with probable food impaction. Patient has been having frequent episodes of dysphasia requiring regurgitation over the past few months. Last EGD June 2019 with distal esophagitis but no stricture appreciated Suspect he does have distal esophageal stricture or ring   PLAN: Patient has been scheduled for upper endoscopy with removal of food impaction with Dr. Lyndel Safe today. Patient is aware that his esophagus will not be dilated today, he will need to be scheduled for EGD with dilation with Dr. Ardis Hughs as soon as possible He was advised to stay on a very soft diet and to avoid pieces of meat and bread until dilation can be done. Start Protonix 40 mg p.o. every morning AC breakfast.   Amy Esterwood  09/03/2018, 11:44 AM   Attending physician's note   I have taken an interval history, reviewed the chart and examined the patient. I agree with the Advanced Practitioner's note, impression and recommendations.   48 year old with acute dysphagia suggestive of food impaction for emergent EGD.  Explained risks and benefits including small but definite risks of esophageal perforation.  Previous history of erosive esophagitis June 2019 not taking PPIs.  Plan: Emergent EGD.  Would need Protonix 40 mg p.o. once a day.  Carmell Austria, MD [facility] [phone].

## 2018-09-03 NOTE — ED Triage Notes (Signed)
Pt reports eating steak last night and then felt like it got stuck in epigastric area and hurts to lay flat. Pt states that he threw up once and got one piece out but it still feels like there is more stuck.

## 2018-09-03 NOTE — Op Note (Signed)
[facility] Patient Name: Bobby Mcbride Procedure Date: 09/03/2018 MRN: [id number] Attending MD: Lynann Bolognaajesh Marvella Jenning , MD Date of Birth: 03/05/1971 CSN: [id number] Age: 48 Admit Type: Inpatient Procedure:                Upper GI endoscopy Indications:              Foreign body in the esophagus. History of acute                            food impaction after eating steak yesterday at 5 PM. Providers:                Lynann Bolognaajesh Halie Gass, MD, Dwain SarnaPatricia Ford, RN, Brion AlimentShayla                            Proctor, Technician Referring MD:              Medicines:                General Anesthesia Complications:            No immediate complications. Estimated Blood Loss:     Estimated blood loss: none. Procedure:                Pre-Anesthesia Assessment:                           - Prior to the procedure, a History and Physical                            was performed, and patient medications and                            allergies were reviewed. The patient's tolerance of                            previous anesthesia was also reviewed. The risks                            and benefits of the procedure and the sedation                            options and risks were discussed with the patient.                            All questions were answered, and informed consent                            was obtained. Prior Anticoagulants: The patient has                            taken no previous anticoagulant or antiplatelet                            agents. ASA Grade Assessment: II - A patient with  mild systemic disease. After reviewing the risks                            and benefits, the patient was deemed in                            satisfactory condition to undergo the procedure.                           After obtaining informed consent, the endoscope was                            passed under direct vision. Throughout the                            procedure,  the patient's blood pressure, pulse, and                            oxygen saturations were monitored continuously. The                            [id number]-H190 ([id number]) Olympus gastroscope was                            introduced through the mouth, and advanced to the                            second part of duodenum. The upper GI endoscopy was                            accomplished without difficulty. The patient                            tolerated the procedure well. Scope In: Scope Out: Findings:      The food bolus had already passed. Moderate esophagitis with no bleeding       was found in the distal esophagus. Biopsies were taken with a cold       forceps for histology. Estimated blood loss: none. No obvious stricture.      Few linear furrows were noted in the proximal and mid esophagus.       Multiple (over 6) biopsies were obtained from proximal and midesophagus       and sent for histology for possible eosinophilic esophagitis.      The entire examined stomach was normal. Biopsies were taken with a cold       forceps for Helicobacter pylori testing. Estimated blood loss: none.      Localized mild inflammation characterized by erythema was found in the       first portion of the duodenum. Estimated blood loss: none. Impression:               - Moderate esophagitis likely d/t recent food                            impaction s/p spontaneous disimpaction. (biopsied)                           -  Few linear furrows in the proximal and mid                            esophagus (biopsied to r/o eosinophilic esophagitis)                           - Mild Duodenitis. Moderate Sedation:      Not Applicable - Patient had care per Anesthesia. Recommendation:           - Patient has a contact number available for                            emergencies. The signs and symptoms of potential                            delayed complications were discussed with the                             patient. Return to normal activities tomorrow.                            Written discharge instructions were provided to the                            patient.                           - Soft diet today. I have instructed patient that                            he needs to chew foods specially meats and breads                            well and eat slowly.                           - Protonix 40 mg p.o. once a day                           - Await pathology results.                           - Repeat upper endoscopy with dilatation in 2-3                            weeks once the esophagitis is better. We will get                            in touch with Dr. Christella HartiganJacobs.                           - Return to GI office PRN. Procedure Code(s):        --- Professional ---  [id number], Esophagogastroduodenoscopy, flexible,                            transoral; with biopsy, single or multiple Diagnosis Code(s):        --- Professional ---                           K20.9, Esophagitis, unspecified                           K29.80, Duodenitis without bleeding                           T18.108A, Unspecified foreign body in esophagus                            causing other injury, initial encounter CPT copyright 2019 American Medical Association. All rights reserved. The codes documented in this report are preliminary and upon coder review may  be revised to meet current compliance requirements. Lynann Bolognaajesh Ziyanna Tolin, MD 09/03/2018 3:45:57 PM This report has been signed electronically. Number of Addenda: 0

## 2018-09-03 NOTE — ED Notes (Signed)
Patient transported to X-ray 

## 2018-09-03 NOTE — Transfer of Care (Signed)
Immediate Anesthesia Transfer of Care Note  Patient: Bobby Mcbride  Procedure(s) Performed: ESOPHAGOGASTRODUODENOSCOPY (EGD) WITH PROPOFOL (N/A )  Patient Location: Endoscopy Unit  Anesthesia Type:General  Level of Consciousness: awake, alert , oriented and patient cooperative  Airway & Oxygen Therapy: Patient Spontanous Breathing and Patient connected to face mask oxygen  Post-op Assessment: Report given to RN, Post -op Vital signs reviewed and stable and Patient moving all extremities  Post vital signs: Reviewed and stable  Last Vitals:  Vitals Value Taken Time  BP    Temp    Pulse    Resp    SpO2      Last Pain:  Vitals:   09/03/18 1339  TempSrc: Oral  PainSc: 8          Complications: No apparent anesthesia complications

## 2018-09-03 NOTE — Discharge Instructions (Signed)
YOU HAD AN ENDOSCOPIC PROCEDURE TODAY: Refer to the procedure report and other information in the discharge instructions given to you for any specific questions about what was found during the examination. If this information does not answer your questions, please call [place] office at [phone] to clarify.   YOU SHOULD EXPECT: Some feelings of bloating in the abdomen. Passage of more gas than usual. Walking can help get rid of the air that was put into your GI tract during the procedure and reduce the bloating. If you had a lower endoscopy (such as a colonoscopy or flexible sigmoidoscopy) you may notice spotting of blood in your stool or on the toilet paper. Some abdominal soreness may be present for a day or two, also.  DIET: Your first meal following the procedure should be a light meal and then it is ok to progress to your normal diet. A half-sandwich or bowl of soup is an example of a good first meal. Heavy or fried foods are harder to digest and may make you feel nauseous or bloated. Drink plenty of fluids but you should avoid alcoholic beverages for 24 hours. If you had a esophageal dilation, please see attached instructions for diet.    ACTIVITY: Your care partner should take you home directly after the procedure. You should plan to take it easy, moving slowly for the rest of the day. You can resume normal activity the day after the procedure however YOU SHOULD NOT DRIVE, use power tools, machinery or perform tasks that involve climbing or major physical exertion for 24 hours (because of the sedation medicines used during the test).   SYMPTOMS TO REPORT IMMEDIATELY: A gastroenterologist can be reached at any hour. Please call [phone]  for any of the following symptoms:   Following upper endoscopy (EGD, EUS, ERCP, esophageal dilation) Vomiting of blood or coffee ground material  New, significant abdominal pain  New, significant chest pain or pain under the shoulder blades  Painful or  persistently difficult swallowing  New shortness of breath  Black, tarry-looking or red, bloody stools  FOLLOW UP:  If any biopsies were taken you will be contacted by phone or by letter within the next 1-3 weeks. Call [phone]  if you have not heard about the biopsies in 3 weeks.  Please also call with any specific questions about appointments or follow up tests.  

## 2018-09-03 NOTE — Anesthesia Procedure Notes (Addendum)
Procedure Name: Intubation Date/Time: 09/03/2018 2:54 PM Performed by: Mitzie Na, CRNA Pre-anesthesia Checklist: Patient identified, Emergency Drugs available, Suction available, Patient being monitored and Timeout performed Patient Re-evaluated:Patient Re-evaluated prior to induction Oxygen Delivery Method: Circle system utilized Preoxygenation: Pre-oxygenation with 100% oxygen Induction Type: IV induction, Rapid sequence and Cricoid Pressure applied Ventilation: Mask ventilation without difficulty Laryngoscope Size: Mac and 3 Grade View: Grade III Tube type: Oral Tube size: 7.5 mm Number of attempts: 1 Airway Equipment and Method: Stylet and Oral airway Placement Confirmation: ETT inserted through vocal cords under direct vision,  positive ETCO2 and breath sounds checked- equal and bilateral Secured at: 24 cm Tube secured with: Tape Dental Injury: Teeth and Oropharynx as per pre-operative assessment

## 2018-09-03 NOTE — Telephone Encounter (Signed)
-----   Message from Alfredia Ferguson, PA-C sent at 09/03/2018  3:49 PM EDT ----- Regarding: EGD with Dilation Dan- pt was in ER today with food impaction. EGD with [facility] - steak had past after sedated - has esophagitis ?eosinophilic  and distal stricture . Please get him set up for repeat EGD with Dilation very soon. Also Lola Lofaro please send rx for Protonix 40 mg qam#30 /11  Thank you

## 2018-09-03 NOTE — Anesthesia Preprocedure Evaluation (Signed)
Anesthesia Evaluation  Patient identified by MRN, date of birth, ID band Patient awake    Reviewed: Allergy & Precautions, H&P , NPO status , Patient's Chart, lab work & pertinent test results  Airway Mallampati: II  TM Distance: >3 FB Neck ROM: Full    Dental no notable dental hx.    Pulmonary neg pulmonary ROS,    Pulmonary exam normal breath sounds clear to auscultation       Cardiovascular negative cardio ROS Normal cardiovascular exam Rhythm:Regular Rate:Normal     Neuro/Psych negative neurological ROS  negative psych ROS   GI/Hepatic Neg liver ROS, PUD,   Endo/Other  negative endocrine ROS  Renal/GU negative Renal ROS  negative genitourinary   Musculoskeletal negative musculoskeletal ROS (+)   Abdominal   Peds negative pediatric ROS (+)  Hematology negative hematology ROS (+)   Anesthesia Other Findings   Reproductive/Obstetrics negative OB ROS                             Anesthesia Physical  Anesthesia Plan  ASA: II  Anesthesia Plan: MAC   Post-op Pain Management:    Induction: Intravenous  PONV Risk Score and Plan:   Airway Management Planned: Natural Airway and Simple Face Mask  Additional Equipment:   Intra-op Plan:   Post-operative Plan:   Informed Consent: I have reviewed the patients History and Physical, chart, labs and discussed the procedure including the risks, benefits and alternatives for the proposed anesthesia with the patient or authorized representative who has indicated his/her understanding and acceptance.       Plan Discussed with: CRNA, Anesthesiologist and Surgeon  Anesthesia Plan Comments:         Anesthesia Quick Evaluation

## 2018-09-04 ENCOUNTER — Encounter (HOSPITAL_COMMUNITY): Payer: Self-pay | Admitting: Gastroenterology

## 2018-09-04 NOTE — Telephone Encounter (Signed)
Bobby Banister, MD  Jackquline Denmark, MD; Dhilan Lasso, RN; [place], [place], PA-C        Thanks [place] and Amy.    Steele Stracener,  Can you put him in for [facility] EGD in next 2-3 weeks. With dilation. Thanks

## 2018-09-04 NOTE — Telephone Encounter (Signed)
The pt has been scheduled for 09/25/18 for EGD and 09/18/18 for previsit The patient has been notified of this information and all questions answered.  The pt has been advised of the information and verbalized understanding.

## 2018-09-10 ENCOUNTER — Encounter: Payer: Self-pay | Admitting: Gastroenterology

## 2018-09-18 ENCOUNTER — Ambulatory Visit (AMBULATORY_SURGERY_CENTER): Payer: Self-pay

## 2018-09-18 ENCOUNTER — Other Ambulatory Visit: Payer: Self-pay

## 2018-09-18 VITALS — Ht 67.0 in | Wt 172.0 lb

## 2018-09-18 DIAGNOSIS — R1310 Dysphagia, unspecified: Secondary | ICD-10-CM

## 2018-09-18 NOTE — Progress Notes (Signed)
Denies allergies to eggs or soy products. Denies complication of anesthesia or sedation. Denies use of weight loss medication. Denies use of O2.   Emmi instructions given for colonoscopy.  Pre-Visit was conducted by phone due to Covid 19. Instructions were reviewed and mailed to patients confirmed home address. Patient was encouraged to call if he had any questions regarding instructions.  

## 2018-09-24 ENCOUNTER — Telehealth: Payer: Self-pay | Admitting: Gastroenterology

## 2018-09-24 NOTE — Telephone Encounter (Signed)

## 2018-09-25 ENCOUNTER — Ambulatory Visit (AMBULATORY_SURGERY_CENTER): Payer: BC Managed Care – PPO | Admitting: Gastroenterology

## 2018-09-25 ENCOUNTER — Encounter: Payer: Self-pay | Admitting: Gastroenterology

## 2018-09-25 ENCOUNTER — Other Ambulatory Visit: Payer: Self-pay

## 2018-09-25 VITALS — BP 110/81 | HR 71 | Temp 98.2°F | Resp 20

## 2018-09-25 DIAGNOSIS — K209 Esophagitis, unspecified without bleeding: Secondary | ICD-10-CM

## 2018-09-25 DIAGNOSIS — K210 Gastro-esophageal reflux disease with esophagitis: Secondary | ICD-10-CM | POA: Diagnosis not present

## 2018-09-25 DIAGNOSIS — K208 Other esophagitis: Secondary | ICD-10-CM | POA: Diagnosis not present

## 2018-09-25 DIAGNOSIS — R1310 Dysphagia, unspecified: Secondary | ICD-10-CM

## 2018-09-25 DIAGNOSIS — R1319 Other dysphagia: Secondary | ICD-10-CM

## 2018-09-25 DIAGNOSIS — K222 Esophageal obstruction: Secondary | ICD-10-CM | POA: Diagnosis not present

## 2018-09-25 MED ORDER — SODIUM CHLORIDE 0.9 % IV SOLN: 500.0000 mL | Freq: Once | INTRAVENOUS | Status: DC

## 2018-09-25 NOTE — Progress Notes (Signed)
Trying to get the patient to wake up.  He opens his eyes and states "hmm."  The doctor has been in to speak with him once,but he was too groggy.  4pm Patient is awake and awaiting Dr. Ardis Hughs to return to recovery.

## 2018-09-25 NOTE — Progress Notes (Signed)
1530 Pt experienced laryngeal spasm with sats down to 80 to 85%, jawthrust performed with sats> 92%. vss

## 2018-09-25 NOTE — Progress Notes (Signed)
Pt's states no medical or surgical changes since previsit or office visit. 

## 2018-09-25 NOTE — Patient Instructions (Signed)
YOU HAD AN ENDOSCOPIC PROCEDURE TODAY AT [facility]:   Refer to the procedure report that was given to you for any specific questions about what was found during the examination.  If the procedure report does not answer your questions, please call your gastroenterologist to clarify.  If you requested that your care partner not be given the details of your procedure findings, then the procedure report has been included in a sealed envelope for you to review at your convenience later.  YOU SHOULD EXPECT: Some feelings of bloating in the abdomen. Passage of more gas than usual.  Walking can help get rid of the air that was put into your GI tract during the procedure and reduce the bloating.   Please Note:  You might notice some irritation and congestion in your nose or some drainage.  This is from the oxygen used during your procedure.  There is no need for concern and it should clear up in a day or so.  SYMPTOMS TO REPORT IMMEDIATELY:   Following upper endoscopy (EGD)  Vomiting of blood or coffee ground material  New chest pain or pain under the shoulder blades  Painful or persistently difficult swallowing  New shortness of breath  Fever of 100F or higher  Black, tarry-looking stools  For urgent or emergent issues, a gastroenterologist can be reached at any hour by calling [phone].   DIET:  We do recommend nothing by mouth until 5pm, then you may have clear liquids.  If you can tolerate them, you may have a soft diet for the rest of tonight. You may resume a  regular diet tomorrow.  Drink plenty of fluids but you should avoid alcoholic beverages for 24 hours.  ACTIVITY:  You should plan to take it easy for the rest of today and you should NOT DRIVE or use heavy machinery until tomorrow (because of the sedation medicines used during the test).    FOLLOW UP: Our staff will call the number listed on your records 48-72 hours following your procedure to check on you and  address any questions or concerns that you may have regarding the information given to you following your procedure. If we do not reach you, we will leave a message.  We will attempt to reach you two times.  During this call, we will ask if you have developed any symptoms of COVID 19. If you develop any symptoms (ie: fever, flu-like symptoms, shortness of breath, cough etc.) before then, please call [phone].  If you test positive for Covid 19 in the 2 weeks post procedure, please call and report this information to [place].    If any biopsies were taken you will be contacted by phone or by letter within the next 1-3 weeks.  Please call us at [phone] if you have not heard about the biopsies in 3 weeks.    SIGNATURES/CONFIDENTIALITY: You and/or your care partner have signed paperwork which will be entered into your electronic medical record.  These signatures attest to the fact that that the information above on your After Visit Summary has been reviewed and is understood.  Full responsibility of the confidentiality of this discharge information lies with you and/or your care-partner.

## 2018-09-25 NOTE — Op Note (Signed)
[facility] Patient Name: Darcus Pesterimothy Warr Procedure Date: 09/25/2018 2:56 PM MRN: [id number] Endoscopist: Rachael Feeaniel P Greg Cratty , MD Age: [age] Referring MD:  Date of Birth: 01/28/1971 Gender: Male Account #: [id number] Procedure:                Upper GI endoscopy Indications:              Dysphagia, recent food impaction, biopsies of                            esophagus show no EoE, since then improved somewhat                            on PPI once daily. Medicines:                Monitored Anesthesia Care Procedure:                Pre-Anesthesia Assessment:                           - Prior to the procedure, a History and Physical                            was performed, and patient medications and                            allergies were reviewed. The patient's tolerance of                            previous anesthesia was also reviewed. The risks                            and benefits of the procedure and the sedation                            options and risks were discussed with the patient.                            All questions were answered, and informed consent                            was obtained. Prior Anticoagulants: The patient has                            taken no previous anticoagulant or antiplatelet                            agents. ASA Grade Assessment: II - A patient with                            mild systemic disease. After reviewing the risks                            and benefits, the patient was deemed in  satisfactory condition to undergo the procedure.                           After obtaining informed consent, the endoscope was                            passed under direct vision. Throughout the                            procedure, the patient's blood pressure, pulse, and                            oxygen saturations were monitored continuously. The                            Model GIF-[id number]) scope was  introduced                            through the mouth, and advanced to the second part                            of duodenum. The upper GI endoscopy was                            accomplished without difficulty. The patient                            tolerated the procedure well. Scope In: Scope Out: Findings:                 LA Grade A esophagitis was found at the                            gastroesophageal junction. Biopsies were taken with                            a cold forceps for histology.                           There was mild stenosis at the GE junction, I used                            a 16-7618mm CRE balloon inflated to 18mm to dilate                            the stenosis. He was coughing at this point in the                            procedure and so I did not inspect the site                            endoscopically after the dilation.  The exam was otherwise without abnormality. Complications:            No immediate complications. Estimated blood loss:                            None. Estimated Blood Loss:     Estimated blood loss: none. Impression:               - LA Grade A esophagitis was found at the                            gastroesophageal junction. Biopsies were taken with                            a cold forceps for histology.                           - There was mild stenosis at the GE junction, I                            used a 16-6818mm CRE balloon inflated to 18mm to                            dilate the stenosis. He was coughing at this point                            in the procedure and so I did not inspect the site                            endoscopically after the dilation.                           - The examination was otherwise normal. Recommendation:           - Patient has a contact number available for                            emergencies. The signs and symptoms of potential                             delayed complications were discussed with the                            patient. Return to normal activities tomorrow.                            Written discharge instructions were provided to the                            patient.                           - Resume previous diet.                           -  Continue present medications.                           - Await pathology results. Milus Banister, MD 09/25/2018 3:36:25 PM This report has been signed electronically.

## 2018-09-25 NOTE — Progress Notes (Signed)
Called to room to assist during endoscopic procedure.  Patient ID and intended procedure confirmed with present staff. Received instructions for my participation in the procedure from the performing physician.  

## 2018-09-27 ENCOUNTER — Telehealth: Payer: Self-pay

## 2018-09-27 NOTE — Telephone Encounter (Signed)
  Follow up Call-  Call back number 09/25/2018 11/24/2017  Post procedure Call Back phone  # [phone]  Permission to leave phone message Yes Yes  Some recent data might be hidden     Patient questions:  Do you have a fever, pain , or abdominal swelling? No. Pain Score  0 *  Have you tolerated food without any problems? Yes.    Have you been able to return to your normal activities? Yes.    Do you have any questions about your discharge instructions: Diet   No. Medications  No. Follow up visit  No.  Do you have questions or concerns about your Care? No.  Actions: * If pain score is 4 or above: 1. No action needed, pain <4.Have you developed a fever since your procedure? no  2.   Have you had an respiratory symptoms (SOB or cough) since your procedure? no  3.   Have you tested positive for COVID 19 since your procedure no  4.   Have you had any family members/close contacts diagnosed with the COVID 19 since your procedure?  no   If yes to any of these questions please route to Joylene John, RN and Alphonsa Gin, Therapist, sports.

## 2018-09-28 ENCOUNTER — Encounter: Payer: Self-pay | Admitting: Gastroenterology

## 2018-10-02 ENCOUNTER — Telehealth: Payer: Self-pay | Admitting: Gastroenterology

## 2018-10-02 NOTE — Telephone Encounter (Signed)
The pt was advised to contact [employer] and have them fax over a form with what information they require.  He was advised to have that sent to our medical records or billing department.  The pt has been advised of the information and verbalized understanding.

## 2018-10-22 ENCOUNTER — Telehealth: Payer: Self-pay | Admitting: Gastroenterology

## 2018-10-22 NOTE — Telephone Encounter (Signed)
The pt calls to report blood in his urine.  I advised him that he would need to call his PCP or urologist.  He tells me that he does not have neither.  I advised him to go to urgent care for evaluation.  The pt has been advised of the information and verbalized understanding.

## 2019-05-26 ENCOUNTER — Other Ambulatory Visit: Payer: Self-pay | Admitting: Surgical

## 2019-05-26 ENCOUNTER — Telehealth: Payer: Self-pay | Admitting: Surgical

## 2019-05-26 MED ORDER — TRAMADOL HCL 50 MG PO TABS: 50.0000 mg | ORAL_TABLET | Freq: Two times a day (BID) | ORAL | 0 refills | Status: DC | PRN

## 2019-05-26 MED ORDER — PREDNISONE 10 MG (21) PO TBPK: ORAL_TABLET | ORAL | 0 refills | Status: DC

## 2019-05-26 NOTE — Telephone Encounter (Signed)
Called and spoke with patient.  He is complaining of left-sided low back pain since working his part-time job on Friday. 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  He notes "20 out of 10" left-sided low back pain that radiates down his left leg into the posterior left calf.  Pain is worse when lifting his left leg up, as if to step into his truck.  Denies any bowel/bladder incontinence, saddle anesthesia, fevers, chills, weakness.  Has been taking tylenol/ibuprofen with little relief.  No hx of spine surgery but does report hx of scoliosis as a teenager.  Prescribed steroid dosepack and tramadol BID.  Patient will call the office tomorrow morning to arrange appointment with an [place] provider.

## 2019-05-27 ENCOUNTER — Other Ambulatory Visit: Payer: Self-pay

## 2019-05-27 ENCOUNTER — Encounter: Payer: Self-pay | Admitting: Orthopedic Surgery

## 2019-05-27 ENCOUNTER — Ambulatory Visit: Payer: BLUE CROSS/BLUE SHIELD | Admitting: Orthopedic Surgery

## 2019-05-27 ENCOUNTER — Telehealth: Payer: Self-pay

## 2019-05-27 ENCOUNTER — Ambulatory Visit: Payer: Self-pay

## 2019-05-27 DIAGNOSIS — M5410 Radiculopathy, site unspecified: Secondary | ICD-10-CM | POA: Diagnosis not present

## 2019-05-27 DIAGNOSIS — M545 Low back pain, unspecified: Secondary | ICD-10-CM

## 2019-05-27 DIAGNOSIS — M549 Dorsalgia, unspecified: Secondary | ICD-10-CM | POA: Diagnosis not present

## 2019-05-27 MED ORDER — METHOCARBAMOL 500 MG PO TABS: ORAL_TABLET | ORAL | 0 refills | Status: DC

## 2019-05-27 NOTE — Telephone Encounter (Signed)
[facility] Night - Client TELEPHONE ADVICE RECORD [facility] Patient Name: Bobby Mcbride Gender: Male DOB: 07-06-1970 Age: 49 Y 9 M 4 D Return Phone Number: [phone] (Primary) Address: [address] City/State/Zip: [place] [address] Client [facility] Night - Client Client Site [facility] - Night Physician Raechel Ache - MD Contact Type Call Who Is Calling Patient / Member / Family / Caregiver Call Type Triage / Clinical Relationship To Patient Self Return Phone Number [phone] (Primary) Chief Complaint Numbness Reason for Call Symptomatic / Request for Health Information Initial Comment Caller states he started having pain in lower back on left side near buttocks since Friday. Sometimes the pain runs down left leg & hand. States his left hand was numb this morning. His wife has been applying heat pack and he has been taking Ibuprofen. Wife said his back feels warm. He is a Theatre stage manager. Translation No Nurse Assessment Nurse: Saddie Benders, RN, Claudette Date/Time (Eastern Time): 05/25/2019 4:46:01 PM Confirm and document reason for call. If symptomatic, describe symptoms. ---Caller states he has been having lower back pain and left hand was numb since Friday. Pain has been running down left leg. Patient has tried Ibuprofen, Tylenol, heat, and cold. Pain is not improving and is still severe 10/10. Has the patient had close contact with a person known or suspected to have the novel coronavirus illness OR traveled / lives in area with major community spread (including international travel) in the last 14 days from the onset of symptoms? * If Asymptomatic, screen for exposure and travel within the last 14 days. ---No Does the patient have any new or worsening symptoms? ---Yes Will a triage be completed? ---Yes Related visit to physician within the last 2 weeks? ---No Does the PT have any chronic  conditions? (i.e. diabetes, asthma, this includes High risk factors for pregnancy, etc.) ---Yes List chronic conditions. ---Mitral valve prolapse Is this a behavioral health or substance abuse call? ---No PLEASE NOTE: All timestamps contained within this report are represented as [place] Standard Time. CONFIDENTIALTY NOTICE: This fax transmission is intended only for the addressee. It contains information that is legally privileged, confidential or otherwise protected from use or disclosure. If you are not the intended recipient, you are strictly prohibited from reviewing, disclosing, copying using or disseminating any of this information or taking any action in reliance on or regarding this information. If you have received this fax in error, please notify us immediately by telephone so that we can arrange for its return to [place]. Phone: [phone], Toll-Free: [phone], Fax: [phone] Page: 2 of 2 Call Id: [id number] Guidelines Guideline Title Affirmed Question Affirmed Notes Nurse Date/Time Lamount Cohen Time) Back Pain [1] Pain radiates into the thigh or further down the leg AND [2] one leg Saddie Benders, RN, Claudette 05/25/2019 4:50:18 PM Disp. Time Lamount Cohen Time) Disposition Final User 05/25/2019 5:00:13 PM SEE PCP WITHIN 3 DAYS Yes Saddie Benders, RN, Claudette Caller Disagree/Comply Comply Caller Understands Yes PreDisposition Go to Urgent Care/Walk-In Clinic Care Advice Given Per Guideline SEE PCP WITHIN 3 DAYS: * You need to be seen within 2 or 3 days. Call your doctor (or NP/PA) during regular office hours and make an appointment. A clinic or urgent care center are good places to go for care if your doctor's office is closed or you can't get an appointment. NOTE: If office will be open tomorrow, tell caller to call then, not in 3 days. LOCAL HEAT: Apply a  heating pad or hot water bottle to the most painful area for 20 minutes whenever the pain flares up. (Caution about burns.) SLEEP: Sleep on your  side with a pillow between your knees. If you sleep on your back, place a pillow under your knees. Avoid sleeping on the abdomen. The mattress should be firm or reinforced with a board. Avoid waterbeds. ACTIVITY: * Continue ordinary activities as much as your pain permits. Continued activity is more healing for the back than rest. * Avoid any activities that cause severe pain. CALL BACK IF: * Numbness or weakness occurs, or bowel/bladder problems * There are any urine symptoms or fever * You become worse. CARE ADVICE given per Back Pain (Adult) guideline. PAIN MEDICINES: * ACETAMINOPHEN - EXTRA STRENGTH TYLENOL: Take 1,000 mg (two 500 mg pills) every 8 hours as needed. Each Extra Strength Tylenol pill has 500 mg of acetaminophen. The most you should take each day is 3,000 mg (6 pills a day). * IBUPROFEN (E.G., MOTRIN, ADVIL): Take 400 mg (two 200 mg pills) by mouth every 6 hours. The most you should take each day is 1,200 mg (six 200 mg pills), unless your doctor has told you to take more.

## 2019-05-27 NOTE — Telephone Encounter (Signed)
Pt has apt with orthopedic today.

## 2019-05-28 ENCOUNTER — Telehealth: Payer: Self-pay | Admitting: Orthopedic Surgery

## 2019-05-28 ENCOUNTER — Ambulatory Visit
Admission: RE | Admit: 2019-05-28 | Discharge: 2019-05-28 | Disposition: A | Discharge status: Home / Self Care | Payer: BLUE CROSS/BLUE SHIELD | Source: Ambulatory Visit | Attending: Orthopedic Surgery | Admitting: Orthopedic Surgery

## 2019-05-28 ENCOUNTER — Other Ambulatory Visit: Payer: Self-pay

## 2019-05-28 DIAGNOSIS — M545 Low back pain, unspecified: Secondary | ICD-10-CM

## 2019-05-28 DIAGNOSIS — M48061 Spinal stenosis, lumbar region without neurogenic claudication: Secondary | ICD-10-CM | POA: Diagnosis not present

## 2019-05-28 NOTE — Telephone Encounter (Signed)
Patient called.   He was seen yesterday and told he needed an MRI this week. He was making [place] aware that he cant be seen at the facility he was referred to for an MRI until April 10th. He is also wanting to know if other arrangements can be made.    Call back: [phone]

## 2019-05-28 NOTE — Telephone Encounter (Signed)
Can you please help with this ?

## 2019-05-28 NOTE — Progress Notes (Signed)
Pls send to fn for esi thx

## 2019-05-28 NOTE — Telephone Encounter (Signed)
Pt is actually being seen  now at [facility] imaging

## 2019-05-28 NOTE — Telephone Encounter (Signed)
Patient states he needs results ASAP

## 2019-05-29 NOTE — Telephone Encounter (Signed)
Pls call and set up esi somewhere asap

## 2019-05-31 ENCOUNTER — Encounter: Payer: Self-pay | Admitting: Orthopedic Surgery

## 2019-05-31 NOTE — Progress Notes (Signed)
Office Visit Note   Patient: Bobby Mcbride           Date of Birth: [birth year]           MRN: [id number] Visit Date: 05/27/2019 Requested by: No referring provider defined for this encounter. PCP: Patient, No Pcp Per  Subjective: Chief Complaint  Patient presents with   Lower Back - Pain    HPI: Bobby Mcbride is a 49 y.o. male who presents to the office complaining of low back pain.  Patient notes acute onset of low back pain last Friday.  He denies any acute injury leading to onset of pain.  He localizes pain to the axial lumbar spine with radiation to the left leg into the posterior left calf.  He denies any saddle anesthesia or incontinence.  Notes that pain is worse running down his leg when he lifts his leg up as if to get into a high truck.  He has been taking Tylenol PM as well as Ultram and a Medrol Dosepak with little relief.  Denies any fever/chills.  Denies frank weakness but does have borderline intractable pain.              ROS:  All systems reviewed are negative as they relate to the chief complaint within the history of present illness.  Patient denies fevers or chills.  Assessment & Plan: Visit Diagnoses:  1. Intractable back pain   2. Low back pain, unspecified back pain laterality, unspecified chronicity, unspecified whether sciatica present   3. Radicular leg pain     Plan: Patient is a 49 year old male who presents complaining of acute onset low back pain.  Not associated with any injury.  Radiographs are unremarkable with no significant degenerative changes or any fractures noted.  He does have associated left leg radicular pain with a positive straight leg raise on the left.  Negative contralateral straight leg on the right he is having intractable pain that is not made better with Tylenol or Ultram or a Medrol Dosepak.  Ordered open MRI of the lumbar spine to evaluate intractable pain.  We will call him with results and a plan.  Also prescribed Robaxin.   Patient agreed with this plan.  Likely ESI's to follow MRI scan unless large disc is present  Follow-Up Instructions: No follow-ups on file.   Orders:  Orders Placed This Encounter  Procedures   XR Lumbar Spine 2-3 Views   MR Lumbar Spine w/o contrast   Meds ordered this encounter  Medications   methocarbamol (ROBAXIN) 500 MG tablet    Sig: 1 po q 8hrs prn    Dispense:  30 tablet    Refill:  0      Procedures: No procedures performed   Clinical Data: No additional findings.  Objective: Vital Signs: There were no vitals taken for this visit.  Physical Exam:  Constitutional: Patient appears well-developed HEENT:  Head: Normocephalic Eyes:EOM are normal Neck: Normal range of motion Cardiovascular: Normal rate Pulmonary/chest: Effort normal Neurologic: Patient is alert Skin: Skin is warm Psychiatric: Patient has normal mood and affect  Ortho Exam:  Back exam Positive straight leg raise on left Tender to palpation over the axial lumbar spine No significant tenderness to palpation over the paraspinal musculature of the left or right sides. 5/5 motor strength of the hip flexors, quadriceps, hamstrings, dorsiflexion, plantarflexion Sensation intact through all dermatomes of bilateral lower extremities No evidence of hypo or hyperreflexia negative clonus. No saddle paresthesias Specialty Comments:  No specialty comments available.  Imaging: No results found.   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   GERD (gastroesophageal reflux disease)    History of stomach ulcers    Anxiety 09/07/2011   Hemorrhoids 09/01/2011   Renal cyst 08/23/2011   Abdominal pain, other specified site 08/17/2011   Hematemesis/vomiting blood 08/17/2011   Headache(784.0) 08/12/2011   Past Medical History:  Diagnosis Date   GERD (gastroesophageal  reflux disease)    Headache(784.0)    History of stomach ulcers    Lateral meniscus tear    Right   Mallory - Weiss tear    Migraine with aura    MVP (mitral valve prolapse)    Rectal bleeding     Family History  Problem Relation Age of Onset   Hypertension Mother    Stroke Mother    Migraines Mother    Diabetes Mother    Hypertension Father    Stroke Father    Heart failure Father    Asthma Father    Colon polyps Father    Colon cancer Neg Hx    Prostate cancer Neg Hx    Esophageal cancer Neg Hx    Stomach cancer Neg Hx    Rectal cancer Neg Hx     Past Surgical History:  Procedure Laterality Date   APPENDECTOMY  1990's   BIOPSY  09/03/2018   Procedure: BIOPSY;  Surgeon: Jackquline Denmark, MD;  Location: [facility];  Service: Endoscopy;;   ELBOW SURGERY  1987   Right   ESOPHAGOGASTRODUODENOSCOPY  08/18/2011   Procedure: ESOPHAGOGASTRODUODENOSCOPY (EGD);  Surgeon: Jerene Bears, MD;  Location: [facility];  Service: Gastroenterology;  Laterality: N/A;   ESOPHAGOGASTRODUODENOSCOPY  11/11/2011   Procedure: ESOPHAGOGASTRODUODENOSCOPY (EGD);  Surgeon: Gatha Mayer, MD;  Location: [facility];  Service: Endoscopy;  Laterality: N/A;   ESOPHAGOGASTRODUODENOSCOPY (EGD) WITH PROPOFOL N/A 09/03/2018   Procedure: ESOPHAGOGASTRODUODENOSCOPY (EGD) WITH PROPOFOL;  Surgeon: Jackquline Denmark, MD;  Location: [facility];  Service: Endoscopy;  Laterality: N/A;   KNEE ARTHROSCOPY     Right   SHOULDER SURGERY  2012   right rotator cuff   Social History   Occupational History   Occupation: Airline pilot, since 2002    Employer: [employer]  Tobacco Use   Smoking status: Never Smoker   Smokeless tobacco: Never Used  Substance and Sexual Activity   Alcohol use: No   Drug use: No   Sexual activity: Not on file

## 2019-06-03 ENCOUNTER — Encounter: Payer: Self-pay | Admitting: Orthopedic Surgery

## 2019-06-03 ENCOUNTER — Other Ambulatory Visit: Payer: Self-pay

## 2019-06-03 ENCOUNTER — Ambulatory Visit (INDEPENDENT_AMBULATORY_CARE_PROVIDER_SITE_OTHER): Payer: BLUE CROSS/BLUE SHIELD | Admitting: Orthopedic Surgery

## 2019-06-03 DIAGNOSIS — M549 Dorsalgia, unspecified: Secondary | ICD-10-CM | POA: Diagnosis not present

## 2019-06-03 DIAGNOSIS — M5410 Radiculopathy, site unspecified: Secondary | ICD-10-CM | POA: Diagnosis not present

## 2019-06-07 ENCOUNTER — Encounter: Payer: Self-pay | Admitting: Orthopedic Surgery

## 2019-06-07 NOTE — Progress Notes (Signed)
Office Visit Note   Patient: Bobby Mcbride           Date of Birth: 11-08-1970           MRN: [id number] Visit Date: 06/03/2019 Requested by: No referring provider defined for this encounter. PCP: Patient, No Pcp Per  Subjective: Chief Complaint  Patient presents with  . Lower Back - Pain  . Follow-up    HPI: Bobby Mcbride is a 49 y.o. male who presents to the office complaining of low back pain with radiation to posterior left calf.  He returns to discuss MRI results.  He has returned to work.  Notes Robaxin has been helping. Denies any significant improvement with oral course of steroids. MRI L-spine revealed no significant foraminal or canal stenosis.  MRI did reveal L5-S1 facet arthropathy on the right side with lumbarization of S1 segment.                ROS:  All systems reviewed are negative as they relate to the chief complaint within the history of present illness.  Patient denies fevers or chills.  Assessment & Plan: Visit Diagnoses:  1. Radicular leg pain   2. Intractable back pain     Plan: Patient is a 49 year old male who presents complaining of continued low back pain with radiation down the left leg.  No complaints of weakness.  No significant relief from oral steroid course.  MRI was negative for any evidence of stenosis that may explain patient's radicular pain.  Regardless, will refer patient to Dr. Ernestina Patches for his expertise regarding what type of epidural steroid injections may provide some relief for Mr. Travelstead.  Patient agreed with plan follow-up follow-up with the office if he has no relief from injections.  Interestingly his MRI scan really does not match the severity of symptoms that he is having.  Follow-Up Instructions: No follow-ups on file.   Orders:  No orders of the defined types were placed in this encounter.  No orders of the defined types were placed in this encounter.     Procedures: No procedures performed   Clinical Data: No  additional findings.  Objective: Vital Signs: There were no vitals taken for this visit.  Physical Exam:  Constitutional: Patient appears well-developed HEENT:  Head: Normocephalic Eyes:EOM are normal Neck: Normal range of motion Cardiovascular: Normal rate Pulmonary/chest: Effort normal Neurologic: Patient is alert Skin: Skin is warm Psychiatric: Patient has normal mood and affect  Ortho Exam:  No significant tenderness throughout the axial lumbar spine or paraspinal musculature.  5/5 motor strength in the bilateral hip flexors, quadriceps, hamstring, dorsiflexion, plantarflexion.  No evidence of hypo or hyperreflexia.  Sensation intact through all dermatomes of the bilateral lower extremities.  Specialty Comments:  No specialty comments available.  Imaging: No results found.   PMFS History: Patient Active Problem List   Diagnosis Date Noted  . Back pain 10/25/2012  . URI (upper respiratory infection) 03/13/2012  . 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 GERD (gastroesophageal reflux disease)   . History of stomach ulcers   . Anxiety 09/07/2011  . Hemorrhoids 09/01/2011  . Renal cyst 08/23/2011  . Abdominal pain, other specified site 08/17/2011  . Hematemesis/vomiting blood 08/17/2011  . Headache(784.0) 08/12/2011   Past Medical History:  Diagnosis Date  . GERD (gastroesophageal reflux disease)   . Headache(784.0)   . History of stomach ulcers   . Lateral meniscus tear    Right  . Mallory - Weiss tear   . Migraine  with aura   . MVP (mitral valve prolapse)   . Rectal bleeding     Family History  Problem Relation Age of Onset  . Hypertension Mother   . Stroke Mother   . Migraines Mother   . Diabetes Mother   . Hypertension Father   . Stroke Father   . Heart failure Father   . Asthma Father   . Colon polyps Father   . Colon cancer Neg Hx   . Prostate cancer Neg Hx   . Esophageal cancer Neg Hx   . Stomach cancer Neg Hx   . Rectal cancer Neg Hx     Past Surgical  History:  Procedure Laterality Date  . APPENDECTOMY  1990's  . BIOPSY  09/03/2018   Procedure: BIOPSY;  Surgeon: Lynann Bologna, MD;  Location: [facility];  Service: Endoscopy;;  . ELBOW SURGERY  1987   Right  . ESOPHAGOGASTRODUODENOSCOPY  08/18/2011   Procedure: ESOPHAGOGASTRODUODENOSCOPY (EGD);  Surgeon: Beverley Fiedler, MD;  Location: [facility];  Service: Gastroenterology;  Laterality: N/A;  . ESOPHAGOGASTRODUODENOSCOPY  11/11/2011   Procedure: ESOPHAGOGASTRODUODENOSCOPY (EGD);  Surgeon: Iva Boop, MD;  Location: [facility];  Service: Endoscopy;  Laterality: N/A;  . ESOPHAGOGASTRODUODENOSCOPY (EGD) WITH PROPOFOL N/A 09/03/2018   Procedure: ESOPHAGOGASTRODUODENOSCOPY (EGD) WITH PROPOFOL;  Surgeon: Lynann Bologna, MD;  Location: [facility];  Service: Endoscopy;  Laterality: N/A;  . KNEE ARTHROSCOPY     Right  . SHOULDER SURGERY  2012   right rotator cuff   Social History   Occupational History  . Occupation: IT sales professional, since 2002    Employer: [employer]  Tobacco Use  . Smoking status: Never Smoker  . Smokeless tobacco: Never Used  Substance and Sexual Activity  . Alcohol use: No  . Drug use: No  . Sexual activity: Not on file

## 2019-06-13 ENCOUNTER — Other Ambulatory Visit: Payer: Self-pay

## 2019-06-13 ENCOUNTER — Encounter: Payer: Self-pay | Admitting: Physical Medicine and Rehabilitation

## 2019-06-13 ENCOUNTER — Ambulatory Visit (INDEPENDENT_AMBULATORY_CARE_PROVIDER_SITE_OTHER): Payer: BLUE CROSS/BLUE SHIELD | Admitting: Physical Medicine and Rehabilitation

## 2019-06-13 ENCOUNTER — Ambulatory Visit: Payer: Self-pay

## 2019-06-13 VITALS — BP 138/89 | HR 82

## 2019-06-13 DIAGNOSIS — M5416 Radiculopathy, lumbar region: Secondary | ICD-10-CM

## 2019-06-13 MED ORDER — METHYLPREDNISOLONE ACETATE 80 MG/ML IJ SUSP
40.0000 mg | Freq: Once | INTRAMUSCULAR | Status: AC
  Administered 2019-06-13: 40 mg

## 2019-06-13 NOTE — Progress Notes (Signed)
Pt states pain in the left side of the lower back that radiates into the left thigh. Pt states pain started middle of march 2021. Laying on left side makes pain worse. Muscle relaxer helps with pain.   .Numeric Pain Rating Scale and Functional Assessment Average Pain 8   In the last MONTH (on 0-10 scale) has pain interfered with the following?  1. General activity like being  able to carry out your everyday physical activities such as walking, climbing stairs, carrying groceries, or moving a chair?  Rating(10)   +Driver, -BT, -Dye Allergies.

## 2019-06-13 NOTE — Progress Notes (Signed)
Bobby Mcbride - 49 y.o. male MRN [id number]  Date of birth: 10-Apr-1970  Office Visit Note: Visit Date: 06/13/2019 PCP: Patient, No Pcp Per Referred by: Meredith Pel, MD  Subjective: Chief Complaint  Patient presents with  . Lower Back - Pain  . Left Thigh - Pain   HPI:  Bobby Mcbride is a 49 y.o. male who comes in today For planned left L5-S1 interlaminar epidural injection for left low back pain with referral to the knee laterally. MRI with mainly facet arthritis.  ROS Otherwise per HPI.  Assessment & Plan: Visit Diagnoses:  1. Lumbar radiculopathy     Plan: No additional findings.   Meds & Orders:  Meds ordered this encounter  Medications  . methylPREDNISolone acetate (DEPO-MEDROL) injection 40 mg    Orders Placed This Encounter  Procedures  . XR C-ARM NO REPORT  . Epidural Steroid injection    Follow-up: Return if symptoms worsen or fail to improve.   Procedures: No procedures performed  Lumbar Epidural Steroid Injection - Interlaminar Approach with Fluoroscopic Guidance  Patient: Bobby Mcbride      Date of Birth: [birth year] MRN: [id number] PCP: Patient, No Pcp Per      Visit Date: 06/13/2019   Universal Protocol:     Consent Given By: the patient  Position: PRONE  Additional Comments: Vital signs were monitored before and after the procedure. Patient was prepped and draped in the usual sterile fashion. The correct patient, procedure, and site was verified.   Injection Procedure Details:  Procedure Site One Meds Administered:  Meds ordered this encounter  Medications  . methylPREDNISolone acetate (DEPO-MEDROL) injection 40 mg     Laterality: Left  Location/Site:  S1-2  Needle size: 20 G  Needle type: Tuohy  Needle Placement: Paramedian epidural  Findings:   -Comments: Excellent flow of contrast into the epidural space.  Procedure Details: Using a paramedian approach from the side mentioned above, the region overlying the  inferior lamina was localized under fluoroscopic visualization and the soft tissues overlying this structure were infiltrated with 4 ml. of 1% Lidocaine without Epinephrine. The Tuohy needle was inserted into the epidural space using a paramedian approach.   The epidural space was localized using loss of resistance along with lateral and bi-planar fluoroscopic views.  After negative aspirate for air, blood, and CSF, a 2 ml. volume of Isovue-250 was injected into the epidural space and the flow of contrast was observed. Radiographs were obtained for documentation purposes.    The injectate was administered into the level noted above.   Additional Comments:  The patient tolerated the procedure well Dressing: 2 x 2 sterile gauze and Band-Aid    Post-procedure details: Patient was observed during the procedure. Post-procedure instructions were reviewed.  Patient left the clinic in stable condition.    Clinical History: MRI LUMBAR SPINE WITHOUT CONTRAST  TECHNIQUE: Multiplanar, multisequence MR imaging of the lumbar spine was performed. No intravenous contrast was administered.  COMPARISON:  X-ray 05/27/2019. 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 The left pedicle of the lumbarized S1 segment is dysplastic, unchanged in appearance from prior.  Alignment:  Physiologic.  Vertebrae:  No fracture, evidence of discitis, or bone lesion.  Conus medullaris and cauda equina: Conus extends to the L1-2 level. Conus and cauda equina appear normal.  Paraspinal and other soft tissues: Negative.  Disc levels:  T12-L1: Sagittal sequences  only. Unremarkable.  L1-L2: No significant disc protrusion, foraminal stenosis, or canal stenosis.  L2-L3: No significant disc protrusion, foraminal stenosis, or canal stenosis.  L3-L4: No significant disc protrusion,  foraminal stenosis, or canal stenosis.  L4-L5: No significant disc protrusion, foraminal stenosis, or canal stenosis.  L5-S1: Mild right facet arthropathy resulting in a similar degree of mild right subarticular recess stenosis. No significant disc protrusion, foraminal stenosis, or canal stenosis.  IMPRESSION: 1. No acute osseous abnormality or findings of neural impingement. 2. 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 3. Stable mild right L5-S1 facet arthropathy with a similar degree of mild right subarticular recess stenosis. No foraminal or canal stenosis. 4. Remaining lumbar levels are unremarkable.   Electronically Signed   By: Duanne Guess D.O.   On: 05/28/2019 14:36     Objective:  VS:  HT:    WT:   BMI:     BP:138/89  HR:82bpm  TEMP: ( )  RESP:  Physical Exam  Ortho Exam Imaging: XR C-ARM NO REPORT  Result Date: 06/13/2019 Please see Notes tab for imaging impression.

## 2019-06-13 NOTE — Procedures (Signed)
Lumbar Epidural Steroid Injection - Interlaminar Approach with Fluoroscopic Guidance  Patient: Bobby Mcbride      Date of Birth: [birth year] MRN: [id number] PCP: Patient, No Pcp Per      Visit Date: 06/13/2019   Universal Protocol:     Consent Given By: the patient  Position: PRONE  Additional Comments: Vital signs were monitored before and after the procedure. Patient was prepped and draped in the usual sterile fashion. The correct patient, procedure, and site was verified.   Injection Procedure Details:  Procedure Site One Meds Administered:  Meds ordered this encounter  Medications  . methylPREDNISolone acetate (DEPO-MEDROL) injection 40 mg     Laterality: Left  Location/Site:  S1-2  Needle size: 20 G  Needle type: Tuohy  Needle Placement: Paramedian epidural  Findings:   -Comments: Excellent flow of contrast into the epidural space.  Procedure Details: Using a paramedian approach from the side mentioned above, the region overlying the inferior lamina was localized under fluoroscopic visualization and the soft tissues overlying this structure were infiltrated with 4 ml. of 1% Lidocaine without Epinephrine. The Tuohy needle was inserted into the epidural space using a paramedian approach.   The epidural space was localized using loss of resistance along with lateral and bi-planar fluoroscopic views.  After negative aspirate for air, blood, and CSF, a 2 ml. volume of Isovue-250 was injected into the epidural space and the flow of contrast was observed. Radiographs were obtained for documentation purposes.    The injectate was administered into the level noted above.   Additional Comments:  The patient tolerated the procedure well Dressing: 2 x 2 sterile gauze and Band-Aid    Post-procedure details: Patient was observed during the procedure. Post-procedure instructions were reviewed.  Patient left the clinic in stable condition.

## 2019-09-18 ENCOUNTER — Telehealth: Payer: Self-pay

## 2019-09-18 NOTE — Telephone Encounter (Signed)
Patient would like to know if he could be worked in to see Dr. August Saucer for his low back pain on tomorrow?  Stated that Thursday, 09/19/2019 is his only day off.  CB# [phone].  Please advise.  Thank you.

## 2019-09-18 NOTE — Telephone Encounter (Signed)
Appt scheduled with Franky Macho on Friday at 2pm

## 2019-09-20 ENCOUNTER — Ambulatory Visit: Payer: BLUE CROSS/BLUE SHIELD | Admitting: Surgical

## 2019-09-20 ENCOUNTER — Other Ambulatory Visit: Payer: Self-pay

## 2019-09-20 DIAGNOSIS — M545 Low back pain, unspecified: Secondary | ICD-10-CM

## 2019-09-20 DIAGNOSIS — M5410 Radiculopathy, site unspecified: Secondary | ICD-10-CM | POA: Diagnosis not present

## 2019-09-21 MED ORDER — TRAMADOL HCL 50 MG PO TABS: 50.0000 mg | ORAL_TABLET | Freq: Two times a day (BID) | ORAL | 0 refills | Status: AC | PRN

## 2019-09-23 ENCOUNTER — Telehealth: Payer: Self-pay | Admitting: Physical Medicine and Rehabilitation

## 2019-09-23 NOTE — Telephone Encounter (Signed)
Patient called requesting a call back from Dr. Holden Blas office. Patient stated PA [facility] stated to have clinical call to set an appt for a injection. Please call patient about this matter at [phone].

## 2019-10-06 ENCOUNTER — Encounter: Payer: Self-pay | Admitting: Surgical

## 2019-10-06 NOTE — Progress Notes (Signed)
Office Visit Note   Patient: Bobby Mcbride           Date of Birth: 1970-09-21           MRN: [id number] Visit Date: 09/20/2019 Requested by: No referring provider defined for this encounter. PCP: Patient, No Pcp Per  Subjective: Chief Complaint  Patient presents with  . Back Pain    HPI: Bobby Mcbride is a 49 y.o. male who presents to the office complaining of low back pain. Patient returns following epidural steroid injection by Dr. Alvester Morin. He states that he had relief for 2 days but he had 80% relief over those 2 days. He takes Tylenol and Robaxin without significant relief of his pain. He does note some leg pain that sounds radicular but states that his back pain makes up the majority of his daily pain (80% of his pain comes from his back rather than his leg). He denies any numbness or tingling. Denies any weakness in his legs. He works as a IT sales professional..                ROS: All systems reviewed are negative as they relate to the chief complaint within the history of present illness.  Patient denies fevers or chills.  Assessment & Plan: Visit Diagnoses:  1. Lumbar back pain   2. Radicular leg pain     Plan: Patient is a 49 year old male who complains of low back pain. He presents following epidural steroid injection that provided good relief but only about 2 days of relief. He works as a IT sales professional. Discussed that his occupation and long shifts with a lot of lifting may be contributing to his some of his back pain. He understands. He had excellent relief for a short amount of time with the epidural steroid injection by Dr. Alvester Morin. Referred patient back to Dr. Alvester Morin to try one more injection to see if that will provide more lasting relief. Follow-up in 4 weeks for clinical recheck. If no improvement, will consider referring patient to Dr. Ophelia Charter or Dr. Otelia Sergeant for their opinion on alternative therapies short of surgery that may be helpful to him. Prescribed a one-time prescription of  Ultram to help with his pain. Follow-up in 4 weeks. Patient agreed with plan.  Follow-Up Instructions: No follow-ups on file.   Orders:  Orders Placed This Encounter  Procedures  . Ambulatory referral to Physical Medicine Rehab   Meds ordered this encounter  Medications  . traMADol (ULTRAM) 50 MG tablet    Sig: Take 1 tablet (50 mg total) by mouth every 12 (twelve) hours as needed. Do not take while operating a motor vehicle.  Discontinue if experiencing hallucinations.    Dispense:  30 tablet    Refill:  0      Procedures: No procedures performed   Clinical Data: No additional findings.  Objective: Vital Signs: There were no vitals taken for this visit.  Physical Exam:  Constitutional: Patient appears well-developed HEENT:  Head: Normocephalic Eyes:EOM are normal Neck: Normal range of motion Cardiovascular: Normal rate Pulmonary/chest: Effort normal Neurologic: Patient is alert Skin: Skin is warm Psychiatric: Patient has normal mood and affect  Ortho Exam: Ortho exam demonstrates tenderness to palpation throughout the axial lumbar spine. No paraspinal musculature tenderness. Negative straight leg raise bilaterally. 5/5 motor strength of the bilateral hip flexors, quadriceps, hamstring, dorsiflexion, plantarflexion. Sensation intact through all dermatomes of the bilateral lower extremities. No evidence of hypo or hyperreflexia.  Specialty Comments:  No specialty comments  available.  Imaging: No results found.   PMFS History: Patient Active Problem List   Diagnosis Date Noted  . Back pain 10/25/2012  . URI (upper respiratory infection) 03/13/2012  . 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 GERD (gastroesophageal reflux disease)   . History of stomach ulcers   . Anxiety 09/07/2011  . Hemorrhoids 09/01/2011  . Renal cyst 08/23/2011  . Abdominal pain, other specified site 08/17/2011  . Hematemesis/vomiting blood 08/17/2011  . Headache(784.0) 08/12/2011   Past Medical  History:  Diagnosis Date  . GERD (gastroesophageal reflux disease)   . Headache(784.0)   . History of stomach ulcers   . Lateral meniscus tear    Right  . Mallory - Weiss tear   . Migraine with aura   . MVP (mitral valve prolapse)   . Rectal bleeding     Family History  Problem Relation Age of Onset  . Hypertension Mother   . Stroke Mother   . Migraines Mother   . Diabetes Mother   . Hypertension Father   . Stroke Father   . Heart failure Father   . Asthma Father   . Colon polyps Father   . Colon cancer Neg Hx   . Prostate cancer Neg Hx   . Esophageal cancer Neg Hx   . Stomach cancer Neg Hx   . Rectal cancer Neg Hx     Past Surgical History:  Procedure Laterality Date  . APPENDECTOMY  1990's  . BIOPSY  09/03/2018   Procedure: BIOPSY;  Surgeon: Lynann Bologna, MD;  Location: [facility];  Service: Endoscopy;;  . ELBOW SURGERY  1987   Right  . ESOPHAGOGASTRODUODENOSCOPY  08/18/2011   Procedure: ESOPHAGOGASTRODUODENOSCOPY (EGD);  Surgeon: Beverley Fiedler, MD;  Location: [facility];  Service: Gastroenterology;  Laterality: N/A;  . ESOPHAGOGASTRODUODENOSCOPY  11/11/2011   Procedure: ESOPHAGOGASTRODUODENOSCOPY (EGD);  Surgeon: Iva Boop, MD;  Location: [facility];  Service: Endoscopy;  Laterality: N/A;  . ESOPHAGOGASTRODUODENOSCOPY (EGD) WITH PROPOFOL N/A 09/03/2018   Procedure: ESOPHAGOGASTRODUODENOSCOPY (EGD) WITH PROPOFOL;  Surgeon: Lynann Bologna, MD;  Location: [facility];  Service: Endoscopy;  Laterality: N/A;  . KNEE ARTHROSCOPY     Right  . SHOULDER SURGERY  2012   right rotator cuff   Social History   Occupational History  . Occupation: IT sales professional, since 2002    Employer: [employer]  Tobacco Use  . Smoking status: Never Smoker  . Smokeless tobacco: Never Used  Vaping Use  . Vaping Use: Never used  Substance and Sexual Activity  . Alcohol use: No  . Drug use: No  . Sexual activity: Not on file

## 2019-10-07 ENCOUNTER — Telehealth: Payer: Self-pay | Admitting: Physical Medicine and Rehabilitation

## 2019-10-07 ENCOUNTER — Encounter: Payer: BLUE CROSS/BLUE SHIELD | Admitting: Physical Medicine and Rehabilitation

## 2019-10-07 NOTE — Telephone Encounter (Signed)
Called patient and he will call us back to reschedule.

## 2019-10-07 NOTE — Telephone Encounter (Signed)
Patient called. He would like to cancel his 1:45 appointment. Has to go to the dentist.

## 2019-10-25 ENCOUNTER — Ambulatory Visit: Payer: BLUE CROSS/BLUE SHIELD | Admitting: Surgical

## 2020-02-10 ENCOUNTER — Telehealth: Payer: Self-pay

## 2020-02-10 NOTE — Telephone Encounter (Signed)
Patient called in wanting to get another injection in his back .

## 2020-02-10 NOTE — Telephone Encounter (Signed)
Called patient and rescheduled S1-2 IL with driver and no blood thinners.

## 2020-02-25 ENCOUNTER — Encounter: Payer: Self-pay | Admitting: Physical Medicine and Rehabilitation

## 2020-02-25 ENCOUNTER — Ambulatory Visit: Payer: Self-pay

## 2020-02-25 ENCOUNTER — Ambulatory Visit (INDEPENDENT_AMBULATORY_CARE_PROVIDER_SITE_OTHER): Payer: BLUE CROSS/BLUE SHIELD | Admitting: Physical Medicine and Rehabilitation

## 2020-02-25 ENCOUNTER — Other Ambulatory Visit: Payer: Self-pay

## 2020-02-25 VITALS — BP 133/91 | HR 77

## 2020-02-25 DIAGNOSIS — M5416 Radiculopathy, lumbar region: Secondary | ICD-10-CM

## 2020-02-25 MED ORDER — BETAMETHASONE SOD PHOS & ACET 6 (3-3) MG/ML IJ SUSP
12.0000 mg | Freq: Once | INTRAMUSCULAR | Status: AC
  Administered 2020-02-25: 12 mg

## 2020-02-25 NOTE — Progress Notes (Signed)
Pt state lower back pain. Pt state bending over makes the pain. Pt state sit in a chair and takes pain meds. Pt has hx of inj on 06/13/19 pt state it was fine lasted a couple months. It was 80% relief.  Numeric Pain Rating Scale and Functional Assessment Average Pain 10   In the last MONTH (on 0-10 scale) has pain interfered with the following?  1. General activity like being  able to carry out your everyday physical activities such as walking, climbing stairs, carrying groceries, or moving a chair?  Rating(10)   +Driver, -BT, -Dye Allergies.

## 2020-04-26 NOTE — Progress Notes (Signed)
Bobby Mcbride - 50 y.o. male MRN [id number]  Date of birth: 1970/08/31  Office Visit Note: Visit Date: 02/25/2020 PCP: Patient, No Pcp Per Referred by: Julieanne Cotton, PA-C  Subjective: Chief Complaint  Patient presents with  . Lower Back - Pain   HPI:  Bobby Mcbride is a 50 y.o. male who comes in today for planned repeat Left S1-2 Lumbar epidural steroid injection with fluoroscopic guidance.  The patient has failed conservative care including home exercise, medications, time and activity modification.  This injection will be diagnostic and hopefully therapeutic.  Please see requesting physician notes for further details and justification. Patient received more than 50% pain relief from prior injection.   Referring: Karenann Cai, PA-C   ROS Otherwise per HPI.  Assessment & Plan: Visit Diagnoses:    ICD-10-CM   1. Lumbar radiculopathy  M54.16 XR C-ARM NO REPORT    Epidural Steroid injection    betamethasone acetate-betamethasone sodium phosphate (CELESTONE) injection 12 mg    Plan: No additional findings.   Meds & Orders:  Meds ordered this encounter  Medications  . betamethasone acetate-betamethasone sodium phosphate (CELESTONE) injection 12 mg    Orders Placed This Encounter  Procedures  . XR C-ARM NO REPORT  . Epidural Steroid injection    Follow-up: Return if symptoms worsen or fail to improve.   Procedures: No procedures performed  S1 Lumbosacral Transforaminal Epidural Steroid Injection - Sub-Pedicular Approach with Fluoroscopic Guidance   Patient: Bobby Mcbride      Date of Birth: March 19, 1970 MRN: [id number] PCP: Patient, No Pcp Per      Visit Date: 02/25/2020   Universal Protocol:    Date/Time: 04/26/2209:54 AM  Consent Given By: the patient  Position:  PRONE  Additional Comments: Vital signs were monitored before and after the procedure. Patient was prepped and draped in the usual sterile fashion. The correct patient, procedure, and site was  verified.   Injection Procedure Details:  Procedure Site One Meds Administered:  Meds ordered this encounter  Medications  . betamethasone acetate-betamethasone sodium phosphate (CELESTONE) injection 12 mg    Laterality: Left  Location/Site:  S1 Foramen   Needle size: 22 ga.  Needle type: Spinal  Needle Placement: Transforaminal  Findings:   -Comments: Excellent flow of contrast along the nerve, nerve root and into the epidural space.  Epidurogram: Contrast epidurogram showed no nerve root cut off or restricted flow pattern.  Procedure Details: After squaring off the sacral end-plate to get a true AP view, the C-arm was positioned so that the best possible view of the S1 foramen was visualized. The soft tissues overlying this structure were infiltrated with 2-3 ml. of 1% Lidocaine without Epinephrine.    The spinal needle was inserted toward the target using a "trajectory" view along the fluoroscope beam.  Under AP and lateral visualization, the needle was advanced so it did not puncture dura. Biplanar projections were used to confirm position. Aspiration was confirmed to be negative for CSF and/or blood. A 1-2 ml. volume of Isovue-250 was injected and flow of contrast was noted at each level. Radiographs were obtained for documentation purposes.   After attaining the desired flow of contrast documented above, a 0.5 to 1.0 ml test dose of 0.25% Marcaine was injected into each respective transforaminal space.  The patient was observed for 90 seconds post injection.  After no sensory deficits were reported, and normal lower extremity motor function was noted,   the above injectate was administered so that  equal amounts of the injectate were placed at each foramen (level) into the transforaminal epidural space.   Additional Comments:  The patient tolerated the procedure well Dressing: Band-Aid with 2 x 2 sterile gauze    Post-procedure details: Patient was observed during the  procedure. Post-procedure instructions were reviewed.  Patient left the clinic in stable condition.     Clinical History: MRI LUMBAR SPINE WITHOUT CONTRAST  TECHNIQUE: Multiplanar, multisequence MR imaging of the lumbar spine was performed. No intravenous contrast was administered.  COMPARISON:  X-ray 05/27/2019. 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 The left pedicle of the lumbarized S1 segment is dysplastic, unchanged in appearance from prior.  Alignment:  Physiologic.  Vertebrae:  No fracture, evidence of discitis, or bone lesion.  Conus medullaris and cauda equina: Conus extends to the L1-2 level. Conus and cauda equina appear normal.  Paraspinal and other soft tissues: Negative.  Disc levels:  T12-L1: Sagittal sequences only. Unremarkable.  L1-L2: No significant disc protrusion, foraminal stenosis, or canal stenosis.  L2-L3: No significant disc protrusion, foraminal stenosis, or canal stenosis.  L3-L4: No significant disc protrusion, foraminal stenosis, or canal stenosis.  L4-L5: No significant disc protrusion, foraminal stenosis, or canal stenosis.  L5-S1: Mild right facet arthropathy resulting in a similar degree of mild right subarticular recess stenosis. No significant disc protrusion, foraminal stenosis, or canal stenosis.  IMPRESSION: 1. No acute osseous abnormality or findings of neural impingement. 2. 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 3. Stable mild right L5-S1 facet arthropathy with a similar degree of mild right subarticular recess stenosis. No foraminal or canal stenosis. 4. Remaining lumbar levels are unremarkable.   Electronically Signed   By: Duanne Guess D.O.   On: 05/28/2019 14:36     Objective:  VS:  HT:    WT:   BMI:     BP:(!) 133/91  HR:77bpm  TEMP: ( )  RESP:  Physical Exam Vitals and nursing note reviewed.  Constitutional:      General: He is not in acute distress.    Appearance: Normal appearance. He is not ill-appearing.  HENT:     Head: Normocephalic and atraumatic.     Right Ear: External ear normal.     Left Ear: External ear normal.     Nose: No congestion.  Eyes:     Extraocular Movements: Extraocular movements intact.  Cardiovascular:     Rate and Rhythm: Normal rate.     Pulses: Normal pulses.  Pulmonary:     Effort: Pulmonary effort is normal. No respiratory distress.  Abdominal:     General: There is no distension.     Palpations: Abdomen is soft.  Musculoskeletal:        General: No tenderness or signs of injury.     Cervical back: Neck supple.     Right lower leg: No edema.     Left lower leg: No edema.     Comments: Patient has good distal strength without clonus.  Skin:    Findings: No erythema or rash.  Neurological:     General: No focal deficit present.     Mental Status: He is alert and oriented to person, place, and time.     Sensory: No sensory deficit.     Motor: No weakness or abnormal muscle  tone.     Coordination: Coordination normal.  Psychiatric:        Mood and Affect: Mood normal.        Behavior: Behavior normal.      Imaging: No results found.

## 2020-04-26 NOTE — Procedures (Signed)
S1 Lumbosacral Transforaminal Epidural Steroid Injection - Sub-Pedicular Approach with Fluoroscopic Guidance   Patient: Bobby Mcbride      Date of Birth: 29-Apr-1970 MRN: [id number] PCP: Patient, No Pcp Per      Visit Date: 02/25/2020   Universal Protocol:    Date/Time: 04/26/2209:54 AM  Consent Given By: the patient  Position:  PRONE  Additional Comments: Vital signs were monitored before and after the procedure. Patient was prepped and draped in the usual sterile fashion. The correct patient, procedure, and site was verified.   Injection Procedure Details:  Procedure Site One Meds Administered:  Meds ordered this encounter  Medications  . betamethasone acetate-betamethasone sodium phosphate (CELESTONE) injection 12 mg    Laterality: Left  Location/Site:  S1 Foramen   Needle size: 22 ga.  Needle type: Spinal  Needle Placement: Transforaminal  Findings:   -Comments: Excellent flow of contrast along the nerve, nerve root and into the epidural space.  Epidurogram: Contrast epidurogram showed no nerve root cut off or restricted flow pattern.  Procedure Details: After squaring off the sacral end-plate to get a true AP view, the C-arm was positioned so that the best possible view of the S1 foramen was visualized. The soft tissues overlying this structure were infiltrated with 2-3 ml. of 1% Lidocaine without Epinephrine.    The spinal needle was inserted toward the target using a "trajectory" view along the fluoroscope beam.  Under AP and lateral visualization, the needle was advanced so it did not puncture dura. Biplanar projections were used to confirm position. Aspiration was confirmed to be negative for CSF and/or blood. A 1-2 ml. volume of Isovue-250 was injected and flow of contrast was noted at each level. Radiographs were obtained for documentation purposes.   After attaining the desired flow of contrast documented above, a 0.5 to 1.0 ml test dose of 0.25% Marcaine  was injected into each respective transforaminal space.  The patient was observed for 90 seconds post injection.  After no sensory deficits were reported, and normal lower extremity motor function was noted,   the above injectate was administered so that equal amounts of the injectate were placed at each foramen (level) into the transforaminal epidural space.   Additional Comments:  The patient tolerated the procedure well Dressing: Band-Aid with 2 x 2 sterile gauze    Post-procedure details: Patient was observed during the procedure. Post-procedure instructions were reviewed.  Patient left the clinic in stable condition.

## 2020-07-05 DIAGNOSIS — Z20822 Contact with and (suspected) exposure to covid-19: Secondary | ICD-10-CM | POA: Diagnosis not present

## 2021-04-29 ENCOUNTER — Telehealth: Payer: Self-pay | Admitting: Gastroenterology

## 2021-04-29 NOTE — Telephone Encounter (Signed)
The pt states when he eats steak or chicken he feels like he gets choked.  He was  last seen in 2020 for EGD with Dil. He has an appt with Marchelle Folks on 2/27.  He will keep that appt and will avoid steak, chicken or tough foods.  He will follow a soft diet until the appt.  He will go to the ED if he has any trouble breathing or feels like he has food stuck.

## 2021-04-29 NOTE — Telephone Encounter (Signed)
Patient is having difficulty swallowing and feeling like he's choking.  He has an appt on 2/27 with Marchelle Folks, but he wants to know what he can do in the interim.  He is very anxious.  Please call and advise.

## 2021-05-07 NOTE — Progress Notes (Signed)
05/10/2021 Bobby Mcbride YD:[id number] [birth year]   ASSESSMENT AND PLAN:   Esophageal dysphagia -     pantoprazole (PROTONIX) 40 MG tablet; Take 1 tablet (40 mg total) by mouth 2 (two) times daily before a meal. Will schedule EGD with dilatation to evaluate for possible H. pylori, eosinophilic esophagitis, peptic ulcer disease, or strictures. Do liquid diet, ER precautions discussed I discussed risks of EGD with patient today, including risk of sedation, bleeding or perforation.  Patient provides understanding and gave verbal consent to proceed.  Gastroesophageal reflux disease without esophagitis -     pantoprazole (PROTONIX) 40 MG tablet; Take 1 tablet (40 mg total) by mouth 2 (two) times daily before a meal. Will start the patient on a PPI, will schedule for EGD Lifestyle changes discussed, avoid NSAIDS, ETOH Needs to stop Goody powders  Non-cardiac chest pain -     pantoprazole (PROTONIX) 40 MG tablet; Take 1 tablet (40 mg total) by mouth 2 (two) times daily before a meal. 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  Likely costochondritis- do lidocaine patches or voltern gel, ER precautions discussed.  Possible esophagitis, getting EGD   History of Present Illness:  51 y.o. male  with a past medical history of GERD, PUD, mallory Weiss tear, migraines and others listed below, known to [place] returns to clinic today for evaluation of dysphagia. Last seen 2019 in the off by NP [facility] for nausea 07/2011 EGD showed non-bleeding Mallory Weiss tear, mild duodenitis and also nodularity at GE junction, biopsies actually showed inflamed cardia. No h.pylori.  11/24/2017 EGD LA grade A esophagitis, ortherwise normal 09/25/2018 EGD LA Grade A esophagitis , mild stenosis at GEJ dilated to 56mm- had laryngeal spasm and unable to proceed further. Pathology shows gastritis, no dysplasia.   Of note 08/2011 colonoscopy normal other than hemorrhoids, he is due this  June.   Wife Bobby Mcbride is here with him, provides some of the history.  Has been getting progressively worse for the past year.  He has issue with subs, chicken, steak, will get stuck and have to cough until it comes.  Feels like something is there right now, has been having some very localized left chest pain, worse with coughing. No SOB, no CP with exertion.  He is not on any medications at this time.  He denies GERD.  He takes Goody powders for headaches once a week, he is on ibuprofen 1 pill once a week. Denies smoking/ETOH. He is IT trainer.    Current Medications:        Current Outpatient Medications (Other):    pantoprazole (PROTONIX) 40 MG tablet, Take 1 tablet (40 mg total) by mouth 2 (two) times daily before a meal.  Surgical History:  He  has a past surgical history that includes Elbow surgery (03/14/1985); Appendectomy (11/12/1988); Knee arthroscopy; Shoulder surgery (03/14/2010); Esophagogastroduodenoscopy (08/18/2011); Esophagogastroduodenoscopy (11/11/2011); Esophagogastroduodenoscopy (egd) with propofol (N/A, 09/03/2018); biopsy (09/03/2018); and teeth removal. Family History:  His family history includes Asthma in his father; Colon polyps in his father; Diabetes in his mother; Heart failure in his father; Hypertension in his father and mother; Migraines in his mother; Stroke in his father and mother. Social History:   reports that he has never smoked. He has never used smokeless tobacco. He reports that he does not drink alcohol and does not use drugs.  Current Medications, Allergies, Past Medical History, Past Surgical History, Family History and Social History were reviewed in [employer]  record.  Physical Exam: BP 120/70    Pulse 79    Ht 5\' 7"  (1.702 m)    Wt 178 lb (80.7 kg)    BMI 27.88 kg/m  General:   Pleasant, well developed male in no acute distress Eyes: sclerae anicteric,conjunctive pink  Heart:  regular rate and rhythm, positive  pain on palpation left chest Pulm: Clear anteriorly; no wheezing Abdomen:  Soft, Obese AB, skin exam normal, Normal bowel sounds.  NO  tenderness . Without guarding and Without rebound, without hepatomegaly. Extremities:  Without edema. Peripheral pulses intact.  Neurologic:  Alert and  oriented x4;  grossly normal neurologically. Skin:   Dry and intact without significant lesions or rashes. Psychiatric: Demonstrates good judgement and reason without abnormal affect or behaviors.  Vladimir Crofts, PA-C 05/10/21

## 2021-05-10 ENCOUNTER — Encounter: Payer: Self-pay | Admitting: Physician Assistant

## 2021-05-10 ENCOUNTER — Ambulatory Visit (INDEPENDENT_AMBULATORY_CARE_PROVIDER_SITE_OTHER): Payer: BC Managed Care – PPO | Admitting: Physician Assistant

## 2021-05-10 VITALS — BP 120/70 | HR 79 | Ht 67.0 in | Wt 178.0 lb

## 2021-05-10 DIAGNOSIS — R1319 Other dysphagia: Secondary | ICD-10-CM | POA: Diagnosis not present

## 2021-05-10 DIAGNOSIS — R0789 Other chest pain: Secondary | ICD-10-CM

## 2021-05-10 DIAGNOSIS — K219 Gastro-esophageal reflux disease without esophagitis: Secondary | ICD-10-CM | POA: Diagnosis not present

## 2021-05-10 MED ORDER — PANTOPRAZOLE SODIUM 40 MG PO TBEC: 40.0000 mg | DELAYED_RELEASE_TABLET | Freq: Two times a day (BID) | ORAL | 1 refills | Status: DC

## 2021-05-10 NOTE — Patient Instructions (Addendum)
If you are age 52 or older, your body mass index should be between 23-30. Your Body mass index is 27.88 kg/m. If this is out of the aforementioned range listed, please consider follow up with your Primary Care Provider.  If you are age 67 or younger, your body mass index should be between 19-25. Your Body mass index is 27.88 kg/m. If this is out of the aformentioned range listed, please consider follow up with your Primary Care Provider.   ________________________________________________________  The [place] [facility] providers would like to encourage you to use [facility] to communicate with providers for non-urgent requests or questions.  Due to long hold times on the telephone, sending your provider a message by [facility] may be a faster and more efficient way to get a response.  Please allow 48 business hours for a response.  Please remember that this is for non-urgent requests.  _______________________________________________________  Bonita Quin have been scheduled for an endoscopy. Please follow written instructions given to you at your visit today. If you use inhalers (even only as needed), please bring them with you on the day of your procedure.     Please take your proton pump inhibitor medication 30 minutes to 1 hour before meals- this makes it more effective. TAKE TWICE A DAY Will go to the ER if they have any melena (black stool), hematochezia (blood in stool), coffee ground vomiting, severe pain, severe shortness of breath or chest pain.   Avoid spicy and acidic foods Avoid fatty foods Limit your intake of coffee, tea, alcohol, and carbonated drinks Work to maintain a healthy weight Keep the head of the bed elevated at least 3 inches with blocks or a wedge pillow if you are having any nighttime symptoms Stay upright for 2 hours after eating Avoid meals and snacks three to four hours before bedtime NO GOODY POWDERS CAN USE LIDOCAINE OR VOLTERN GEL FOR CHEST.   DO SOFT/LIQUID DIET UNTIL EGD, GO  TO ER IF FEELS NOT COMING UP/FOOD IMPACTION  Silent reflux: Not all heartburn burns...Marland KitchenMarland KitchenMarland Kitchen  What is LPR? Laryngopharyngeal reflux (LPR) or silent reflux is a condition in which acid that is made in the stomach travels up the esophagus (swallowing tube) and gets to the throat. Not everyone with reflux has a lot of heartburn or indigestion. In fact, many people with LPR never have heartburn. This is why LPR is called SILENT REFLUX, and the terms "Silent reflux" and "LPR" are often used interchangeably. Because LPR is silent, it is sometimes difficult to diagnose.  How can you tell if you have LPR?  Chronic hoarseness- Some people have hoarseness that comes and goes throat clearing  Cough It can cause shortness of breath and cause asthma like symptoms. a feeling of a lump in the throat  difficulty swallowing a problem with too much nose and throat drainage.  Some people will feel their esophagus spasm which feels like their heart beating hard and fast, this will usually be after a meal, at rest, or lying down at night.    How do I treat this? Treatment for LPR should be individualized, and your doctor will suggest the best treatment for you. Generally there are several treatments for LPR: changing habits and diet to reduce reflux,  medications to reduce stomach acid, and  surgery to prevent reflux. Most people with LPR need to modify how and when they eat, as well as take some medication, to get well. Sometimes, nonprescription liquid antacids, such as Maalox, Gelucil and Mylanta are recommended.  When used, these antacids should be taken four times each day - one tablespoon one hour after each meal and before bedtime. Dietary and lifestyle changes alone are not often enough to control LPR - medications that reduce stomach acid are also usually needed. These must be prescribed by our doctor.   TIPS FOR REDUCING REFLUX AND LPR Control your LIFE-STYLE and your DIET! If you use tobacco, QUIT.   Smoking makes you reflux. After every cigarette you have some LPR.  Don't wear clothing that is too tight, especially around the waist (trousers, corsets, belts).  Do not lie down just after eating...in fact, do not eat within three hours of bedtime.  You should be on a low-fat diet.  Limit your intake of red meat.  Limit your intake of butter.  Avoid fried foods.  Avoid chocolate  Avoid cheese.  Avoid eggs. Specifically avoid caffeine (especially coffee and tea), soda pop (especially cola) and mints.  Avoid alcoholic beverages, particularly in the evening.

## 2021-05-11 ENCOUNTER — Other Ambulatory Visit: Payer: Self-pay

## 2021-05-11 ENCOUNTER — Ambulatory Visit (AMBULATORY_SURGERY_CENTER): Payer: BC Managed Care – PPO | Admitting: Gastroenterology

## 2021-05-11 ENCOUNTER — Encounter: Payer: Self-pay | Admitting: Gastroenterology

## 2021-05-11 VITALS — BP 104/48 | HR 94 | Temp 97.7°F | Resp 20 | Ht 67.0 in | Wt 178.0 lb

## 2021-05-11 DIAGNOSIS — K449 Diaphragmatic hernia without obstruction or gangrene: Secondary | ICD-10-CM | POA: Diagnosis not present

## 2021-05-11 DIAGNOSIS — K2100 Gastro-esophageal reflux disease with esophagitis, without bleeding: Secondary | ICD-10-CM | POA: Diagnosis not present

## 2021-05-11 DIAGNOSIS — R1319 Other dysphagia: Secondary | ICD-10-CM | POA: Diagnosis not present

## 2021-05-11 DIAGNOSIS — K222 Esophageal obstruction: Secondary | ICD-10-CM

## 2021-05-11 DIAGNOSIS — K200 Eosinophilic esophagitis: Secondary | ICD-10-CM | POA: Diagnosis not present

## 2021-05-11 DIAGNOSIS — K208 Other esophagitis without bleeding: Secondary | ICD-10-CM | POA: Diagnosis not present

## 2021-05-11 DIAGNOSIS — R1310 Dysphagia, unspecified: Secondary | ICD-10-CM | POA: Diagnosis not present

## 2021-05-11 MED ORDER — SODIUM CHLORIDE 0.9 % IV SOLN: 500.0000 mL | Freq: Once | INTRAVENOUS | Status: DC

## 2021-05-11 NOTE — Progress Notes (Signed)
Sedate, gd SR, tolerated procedure well, VSS, report to RN 

## 2021-05-11 NOTE — Progress Notes (Signed)
VS  DT ? ?Pt's states no medical or surgical changes since previsit or office visit. ? ?

## 2021-05-11 NOTE — Op Note (Signed)
[facility] Patient Name: Bobby Mcbride Procedure Date: 05/11/2021 9:45 AM MRN: [id number] Endoscopist: Milus Banister , MD Age: 51 Referring MD:  Date of Birth: April 27, 1970 Gender: Male Account #: [id number] Procedure:                Upper GI endoscopy Indications:              Dysphagia, chronic, intermittent. Peptic appearing                            stricture 2020 dilated to 77mm. Biopsies of                            esophagus showed no excessive eosinophils. Was not                            taking PPI recently Medicines:                Monitored Anesthesia Care Procedure:                Pre-Anesthesia Assessment:                           - Prior to the procedure, a History and Physical                            was performed, and patient medications and                            allergies were reviewed. The patient's tolerance of                            previous anesthesia was also reviewed. The risks                            and benefits of the procedure and the sedation                            options and risks were discussed with the patient.                            All questions were answered, and informed consent                            was obtained. Prior Anticoagulants: The patient has                            taken no previous anticoagulant or antiplatelet                            agents. ASA Grade Assessment: II - A patient with                            mild systemic disease. After reviewing the risks  and benefits, the patient was deemed in                            satisfactory condition to undergo the procedure.                           After obtaining informed consent, the endoscope was                            passed under direct vision. Throughout the                            procedure, the patient's blood pressure, pulse, and                            oxygen saturations were monitored  continuously. The                            GIF [id number] was introduced through the                            mouth, and advanced to the second part of duodenum.                            The upper GI endoscopy was accomplished without                            difficulty. The patient tolerated the procedure                            well. Scope In: Scope Out: Findings:                 LA Grade A (one or more mucosal breaks less than 5                            mm, not extending between tops of 2 mucosal folds)                            esophagitis was found at the gastroesophageal                            junction. Biopsies were taken with a cold forceps                            for histology.                           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 There was the usual                            superficial mucosal disruption and self limited                            bleeding following dilation.                           A small hiatal hernia was present.                           The examination was otherwise normal. Complications:            No immediate complications. Estimated blood loss:                            None. Estimated Blood Loss:     Estimated blood loss: none. 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 This was biopsied to exclude neoplasia                            (which I think is unlikely) and then dilated to 19mm Recommendation:           - Patient has a contact number available for                            emergencies. The signs and symptoms of potential                            delayed complications were discussed with the                            patient. Return to normal activities tomorrow.                             Written discharge instructions were provided to the                            patient.                           - Resume previous diet.                           - Continue present medications. Continue the                            pantoprazole twice daily for two months and then it                            is safe to decrease to once daily. Your  swallowing                            problems and pains will probably return if you stop                            taking the daily antiacid medicine.                           - Await pathology results. Milus Banister, MD 05/11/2021 10:01:00 AM This report has been signed electronically.

## 2021-05-11 NOTE — Patient Instructions (Signed)
Resume previous diet and medications. Awaiting pathology results. ? ?YOU HAD AN ENDOSCOPIC PROCEDURE TODAY AT THE [facility]:   Refer to the procedure report that was given to you for any specific questions about what was found during the examination.  If the procedure report does not answer your questions, please call your gastroenterologist to clarify.  If you requested that your care partner not be given the details of your procedure findings, then the procedure report has been included in a sealed envelope for you to review at your convenience later. ? ?YOU SHOULD EXPECT: Some feelings of bloating in the abdomen. Passage of more gas than usual.  Walking can help get rid of the air that was put into your GI tract during the procedure and reduce the bloating. If you had a lower endoscopy (such as a colonoscopy or flexible sigmoidoscopy) you may notice spotting of blood in your stool or on the toilet paper. If you underwent a bowel prep for your procedure, you may not have a normal bowel movement for a few days. ? ?Please Note:  You might notice some irritation and congestion in your nose or some drainage.  This is from the oxygen used during your procedure.  There is no need for concern and it should clear up in a day or so. ? ?SYMPTOMS TO REPORT IMMEDIATELY: ? ?Following upper endoscopy (EGD) ? Vomiting of blood or coffee ground material ? New chest pain or pain under the shoulder blades ? Painful or persistently difficult swallowing ? New shortness of breath ? Fever of 100?F or higher ? Black, tarry-looking stools ? ?For urgent or emergent issues, a gastroenterologist can be reached at any hour by calling [phone]. ?Do not use MyChart messaging for urgent concerns.  ? ? ?DIET:  We do recommend a small meal at first, but then you may proceed to your regular diet.  Drink plenty of fluids but you should avoid alcoholic beverages for 24 hours. ? ?ACTIVITY:  You should plan to take it easy for the  rest of today and you should NOT DRIVE or use heavy machinery until tomorrow (because of the sedation medicines used during the test).   ? ?FOLLOW UP: ?Our staff will call the number listed on your records 48-72 hours following your procedure to check on you and address any questions or concerns that you may have regarding the information given to you following your procedure. If we do not reach you, we will leave a message.  We will attempt to reach you two times.  During this call, we will ask if you have developed any symptoms of COVID 19. If you develop any symptoms (ie: fever, flu-like symptoms, shortness of breath, cough etc.) before then, please call [phone].  If you test positive for Covid 19 in the 2 weeks post procedure, please call and report this information to us.   ? ?If any biopsies were taken you will be contacted by phone or by letter within the next 1-3 weeks.  Please call us at [phone] if you have not heard about the biopsies in 3 weeks.  ? ? ?SIGNATURES/CONFIDENTIALITY: ?You and/or your care partner have signed paperwork which will be entered into your electronic medical record.  These signatures attest to the fact that that the information above on your After Visit Summary has been reviewed and is understood.  Full responsibility of the confidentiality of this discharge information lies with you and/or your care-partner.  ?

## 2021-05-11 NOTE — Progress Notes (Signed)
°  The recent H&P (dated yesterday) was reviewed, the patient was examined and there is no change in the patients condition since that H&P was completed.   Rachael Fee  05/11/2021, 9:39 AM

## 2021-05-11 NOTE — Progress Notes (Signed)
Called to room to assist during endoscopic procedure.  Patient ID and intended procedure confirmed with present staff. Received instructions for my participation in the procedure from the performing physician.  

## 2021-05-12 NOTE — Progress Notes (Signed)
I agree with the above note, plan 

## 2021-05-13 ENCOUNTER — Telehealth: Payer: Self-pay

## 2021-05-13 ENCOUNTER — Telehealth: Payer: Self-pay | Admitting: *Deleted

## 2021-05-13 NOTE — Telephone Encounter (Signed)
?  Follow up Call- ? ?Call back number 05/11/2021 09/25/2018  ?Post procedure Call Back phone  # [phone]  ?Permission to leave phone message Yes Yes  ?Some recent data might be hidden  ?  ? ?Patient questions: ? ?Do you have a fever, pain , or abdominal swelling? No. ?Pain Score  0 * ? ?Have you tolerated food without any problems? Yes.   ? ?Have you been able to return to your normal activities? Yes.   ? ?Do you have any questions about your discharge instructions: ?Diet   No. ?Medications  No. ?Follow up visit  No. ? ?Do you have questions or concerns about your Care? Yes.   ? ?Actions: ?* If pain score is 4 or above: ?No action needed, pain <4. ? ?Have you developed a fever since your procedure? no ? ?2.   Have you had an respiratory symptoms (SOB or cough) since your procedure? no ? ?3.   Have you tested positive for COVID 19 since your procedure no ? ?4.   Have you had any family members/close contacts diagnosed with the COVID 19 since your procedure?  no ? ? ?If yes to any of these questions please route to Laverna Peace, RN and Karlton Lemon, RN  ? ? ? ?

## 2021-05-13 NOTE — Telephone Encounter (Signed)
No answer, left message to call back later today, B.Schwartz RN. 

## 2021-05-17 ENCOUNTER — Encounter: Payer: Self-pay | Admitting: Gastroenterology

## 2021-06-14 ENCOUNTER — Telehealth: Payer: Self-pay | Admitting: Gastroenterology

## 2021-06-14 NOTE — Telephone Encounter (Signed)
Per the last path letter the pt needs f/u with DJ in the office.  I have scheduled him for 5/23 DJ next available to discuss EGD /COLON  ?

## 2021-06-14 NOTE — Telephone Encounter (Signed)
Patient called to schedule an EGD/colonoscopy. Last EGD procedure was 05/11/21. Per patient, he was advised to call in April to set up an appointment for another EGD along with a colonoscopy procedure in June. I don't see any recommendations from Dr. Christella Hartigan. Please advise.  ?

## 2021-06-16 LAB — T4: Thyroxine (T4): 6

## 2021-06-16 LAB — FREE THYROXINE INDEX: Free Thyroxine Index: 1.4

## 2021-06-16 LAB — URIC ACID: Uric Acid: 7.4

## 2021-06-16 LAB — T3 UPTAKE: T3 Uptake: 24

## 2021-06-17 LAB — TSH: TSH: 4.01 (ref 0.41–5.90)

## 2021-06-17 LAB — CBC: RBC: 6.02 — AB (ref 3.87–5.11)

## 2021-06-17 LAB — CBC AND DIFFERENTIAL
HCT: 54 — AB (ref 41–53)
Hemoglobin: 17.5 (ref 13.5–17.5)
Platelets: 282 10*3/uL (ref 150–400)
WBC: 10.9

## 2021-06-17 LAB — BASIC METABOLIC PANEL
BUN: 11 (ref 4–21)
Chloride: 101 (ref 99–108)
Creatinine: 1.1 (ref 0.6–1.3)
Glucose: 85
Potassium: 4 mEq/L (ref 3.5–5.1)
Sodium: 142 (ref 137–147)

## 2021-06-17 LAB — COMPREHENSIVE METABOLIC PANEL
Albumin: 4.7 (ref 3.5–5.0)
Calcium: 8.9 (ref 8.7–10.7)
Globulin: 2.5

## 2021-06-17 LAB — HEPATIC FUNCTION PANEL
ALT: 64 U/L — AB (ref 10–40)
AST: 36 (ref 14–40)
Alkaline Phosphatase: 79 (ref 25–125)
Bilirubin, Total: 0.5

## 2021-06-17 LAB — LIPID PANEL
Cholesterol: 208 — AB (ref 0–200)
HDL: 36 (ref 35–70)
LDL Cholesterol: 134
LDl/HDL Ratio: 5.8
Triglycerides: 209 — AB (ref 40–160)

## 2021-06-17 LAB — IRON,TIBC AND FERRITIN PANEL: Iron: 129

## 2021-06-17 LAB — PSA: PSA: 0.2

## 2021-06-28 ENCOUNTER — Emergency Department (HOSPITAL_BASED_OUTPATIENT_CLINIC_OR_DEPARTMENT_OTHER): Payer: BC Managed Care – PPO

## 2021-06-28 ENCOUNTER — Emergency Department (HOSPITAL_BASED_OUTPATIENT_CLINIC_OR_DEPARTMENT_OTHER)
Admission: EM | Admit: 2021-06-28 | Discharge: 2021-06-28 | Disposition: A | Discharge status: Home / Self Care | Payer: BC Managed Care – PPO | Attending: Emergency Medicine | Admitting: Emergency Medicine

## 2021-06-28 ENCOUNTER — Encounter (HOSPITAL_BASED_OUTPATIENT_CLINIC_OR_DEPARTMENT_OTHER): Payer: Self-pay | Admitting: Emergency Medicine

## 2021-06-28 ENCOUNTER — Other Ambulatory Visit (HOSPITAL_BASED_OUTPATIENT_CLINIC_OR_DEPARTMENT_OTHER): Payer: Self-pay

## 2021-06-28 ENCOUNTER — Other Ambulatory Visit: Payer: Self-pay

## 2021-06-28 ENCOUNTER — Ambulatory Visit: Payer: Self-pay | Admitting: *Deleted

## 2021-06-28 DIAGNOSIS — R1310 Dysphagia, unspecified: Secondary | ICD-10-CM | POA: Insufficient documentation

## 2021-06-28 DIAGNOSIS — Z20822 Contact with and (suspected) exposure to covid-19: Secondary | ICD-10-CM | POA: Diagnosis not present

## 2021-06-28 DIAGNOSIS — M542 Cervicalgia: Secondary | ICD-10-CM | POA: Diagnosis not present

## 2021-06-28 DIAGNOSIS — R221 Localized swelling, mass and lump, neck: Secondary | ICD-10-CM | POA: Diagnosis not present

## 2021-06-28 DIAGNOSIS — R220 Localized swelling, mass and lump, head: Secondary | ICD-10-CM | POA: Diagnosis not present

## 2021-06-28 LAB — CBC WITH DIFFERENTIAL/PLATELET
Abs Immature Granulocytes: 0.02 10*3/uL (ref 0.00–0.07)
Basophils Absolute: 0 10*3/uL (ref 0.0–0.1)
Basophils Relative: 0 %
Eosinophils Absolute: 0.5 10*3/uL (ref 0.0–0.5)
Eosinophils Relative: 6 %
HCT: 46.2 % (ref 39.0–52.0)
Hemoglobin: 15.9 g/dL (ref 13.0–17.0)
Immature Granulocytes: 0 %
Lymphocytes Relative: 18 %
Lymphs Abs: 1.5 10*3/uL (ref 0.7–4.0)
MCH: 29.5 pg (ref 26.0–34.0)
MCHC: 34.4 g/dL (ref 30.0–36.0)
MCV: 85.7 fL (ref 80.0–100.0)
Monocytes Absolute: 0.9 10*3/uL (ref 0.1–1.0)
Monocytes Relative: 10 %
Neutro Abs: 5.5 10*3/uL (ref 1.7–7.7)
Neutrophils Relative %: 66 %
Platelets: 238 10*3/uL (ref 150–400)
RBC: 5.39 MIL/uL (ref 4.22–5.81)
RDW: 13.1 % (ref 11.5–15.5)
WBC: 8.4 10*3/uL (ref 4.0–10.5)
nRBC: 0 % (ref 0.0–0.2)

## 2021-06-28 LAB — BASIC METABOLIC PANEL
Anion gap: 6 (ref 5–15)
BUN: 12 mg/dL (ref 6–20)
CO2: 28 mmol/L (ref 22–32)
Calcium: 9.3 mg/dL (ref 8.9–10.3)
Chloride: 104 mmol/L (ref 98–111)
Creatinine, Ser: 1.02 mg/dL (ref 0.61–1.24)
GFR, Estimated: >60 mL/min (ref >60)
Glucose, Bld: 85 mg/dL (ref 70–99)
Potassium: 4.3 mmol/L (ref 3.5–5.1)
Sodium: 138 mmol/L (ref 135–145)

## 2021-06-28 LAB — RESP PANEL BY RT-PCR (FLU A&B, COVID) ARPGX2
Influenza A by PCR: NEGATIVE
Influenza B by PCR: NEGATIVE
SARS Coronavirus 2 by RT PCR: NEGATIVE

## 2021-06-28 LAB — GROUP A STREP BY PCR: Group A Strep by PCR: NOT DETECTED

## 2021-06-28 MED ORDER — IOHEXOL 300 MG/ML  SOLN
100.0000 mL | Freq: Once | INTRAMUSCULAR | Status: AC | PRN
  Administered 2021-06-28: 75 mL via INTRAVENOUS

## 2021-06-28 MED ORDER — LIDOCAINE VISCOUS HCL 2 % MT SOLN
OROMUCOSAL | 0 refills | Status: DC
  Filled 2021-06-28: qty 200, 3d supply, fill #0

## 2021-06-28 MED ORDER — LIDOCAINE VISCOUS HCL 2 % MT SOLN
15.0000 mL | Freq: Once | OROMUCOSAL | Status: AC
  Administered 2021-06-28: 15 mL via OROMUCOSAL
  Filled 2021-06-28: qty 15

## 2021-06-28 MED ORDER — HYDROCODONE-ACETAMINOPHEN 5-325 MG PO TABS
1.0000 | ORAL_TABLET | Freq: Once | ORAL | Status: AC
  Administered 2021-06-28: 1 via ORAL
  Filled 2021-06-28: qty 1

## 2021-06-28 MED ORDER — LIDOCAINE VISCOUS HCL 2 % MT SOLN: OROMUCOSAL | 0 refills | Status: DC

## 2021-06-28 MED ORDER — ONDANSETRON HCL 4 MG/2ML IJ SOLN
4.0000 mg | Freq: Once | INTRAMUSCULAR | Status: AC
Start: 2021-06-28 — End: 2021-06-28
  Administered 2021-06-28: 4 mg via INTRAVENOUS
  Filled 2021-06-28: qty 2

## 2021-06-28 NOTE — ED Provider Notes (Addendum)
?[facility] EM[facility]PT ?Provider Note ? ? ?CSN: [id number] ?Arrival date & time: 06/28/21  0957 ? ?  ? ?History ? ?Chief Complaint  ?Patient presents with  ? Sore Throat  ? ? ?Bobby Mcbride is a 51 y.o. male who presents with pain with swallowing.  Patient states that the pain has been ongoing for about 1 week.  He has no pain at rest.  He can only feel the pain when he is swallowing.  Pain radiates to his ear and sometimes sends pain down his right arm.  He had a recent molar extraction on the same side 1 week ago.  He denies nausea or vomiting, fever or chills, difficulty breathing.  He states that the pain is not reproducible with palpation.  He denies ear pain. ? ? ?Sore Throat ? ? ?  ? ?Home Medications ?Prior to Admission medications   ?Medication Sig Start Date End Date Taking? Authorizing Provider  ?lidocaine (XYLOCAINE) 2 % solution Swallow 10 ml every 4-6 hours as needed for pain with swallowing 06/28/21   Peter Garter, PA  ?pantoprazole (PROTONIX) 40 MG tablet Take 1 tablet (40 mg total) by mouth 2 (two) times daily before a meal. 05/10/21   Doree Albee, PA-C  ?   ? ?Allergies    ?Codeine, Hydrocodone-acetaminophen, Food, Ibuprofen, and Compazine [prochlorperazine edisylate]   ? ?Review of Systems   ?Review of Systems ? ?Physical Exam ?Updated Vital Signs ?BP 133/85   Pulse (!) 53   Temp 97.9 ?F (36.6 ?C) (Oral)   Resp 16   SpO2 100%  ?Physical Exam ?Vitals and nursing note reviewed.  ?Constitutional:   ?   General: He is not in acute distress. ?   Appearance: He is well-developed. He is not diaphoretic.  ?HENT:  ?   Head: Normocephalic and atraumatic.  ?   Right Ear: Tympanic membrane normal.  ?   Left Ear: Tympanic membrane normal.  ?   Nose: No congestion.  ?   Mouth/Throat:  ?   Mouth: Mucous membranes are moist.  ?   Tonsils: No tonsillar exudate or tonsillar abscesses.  ?   Comments: Recent extraction site on the right lower jaw appears well-healing without evidence  of infection, no posterior oral pharyngeal erythema or exudate, uvula midline without tonsillar swelling.  No hypoglossal fullness, no palpable swelling or tenderness along the anterior neck or lateral, no obvious tonsillar or cervical adenopathy. ?Eyes:  ?   General: No scleral icterus. ?   Conjunctiva/sclera: Conjunctivae normal.  ?Cardiovascular:  ?   Rate and Rhythm: Normal rate and regular rhythm.  ?   Heart sounds: Normal heart sounds.  ?Pulmonary:  ?   Effort: Pulmonary effort is normal. No respiratory distress.  ?   Breath sounds: Normal breath sounds.  ?Abdominal:  ?   Palpations: Abdomen is soft.  ?   Tenderness: There is no abdominal tenderness.  ?Musculoskeletal:  ?   Cervical back: Normal range of motion and neck supple.  ?Skin: ?   General: Skin is warm and dry.  ?Neurological:  ?   Mental Status: He is alert.  ?Psychiatric:     ?   Mood and Affect: Mood is anxious.     ?   Behavior: Behavior normal.  ? ? ?ED Results / Procedures / Treatments   ?Labs ?(all labs ordered are listed, but only abnormal results are displayed) ?Labs Reviewed  ?GROUP A STREP BY PCR  ?RESP PANEL BY RT-PCR (FLU A&B, COVID)  ARPGX2  ?BASIC METABOLIC PANEL  ?CBC WITH DIFFERENTIAL/PLATELET  ? ? ?EKG ?None ? ?Radiology ?CT Soft Tissue Neck W Contrast ? ?Result Date: 06/28/2021 ?CLINICAL DATA:  Right side pain when swallowing EXAM: CT NECK WITH CONTRAST TECHNIQUE: Multidetector CT imaging of the neck was performed using the standard protocol following the bolus administration of intravenous contrast. RADIATION DOSE REDUCTION: This exam was performed according to the departmental dose-optimization program which includes automated exposure control, adjustment of the mA and/or kV according to patient size and/or use of iterative reconstruction technique. CONTRAST:  58mL OMNIPAQUE IOHEXOL 300 MG/ML  SOLN COMPARISON:  None. FINDINGS: Pharynx and larynx: Unremarkable.  No mass or swelling. Salivary glands: Partial fat replacement of the  parotids. Submandibular glands are unremarkable. Thyroid: Normal. Lymph nodes: No enlarged or abnormal density nodes. Vascular: Major neck vessels are patent. Retropharyngeal course of proximal internal carotids. Limited intracranial: No abnormal enhancement. Visualized orbits: Unremarkable. Mastoids and visualized paranasal sinuses: Mastoid air cells are clear. Partial opacification of the maxillary sinuses. Skeleton: Cervical spine degenerative changes. Temporomandibular joints are unremarkable. Upper chest: Included upper lungs are clear. Other: Partially edentulous with multiple caries. IMPRESSION: No acute abnormality.  No neck mass or adenopathy. Electronically Signed   By: Guadlupe Spanish M.D.   On: 06/28/2021 12:04   ? ?Procedures ?Procedures  ? ? ?Medications Ordered in ED ?Medications  ?iohexol (OMNIPAQUE) 300 MG/ML solution 100 mL (75 mLs Intravenous Contrast Given 06/28/21 1154)  ?ondansetron (ZOFRAN) injection 4 mg (4 mg Intravenous Given 06/28/21 1225)  ?HYDROcodone-acetaminophen (NORCO/VICODIN) 5-325 MG per tablet 1 tablet (1 tablet Oral Given 06/28/21 1303)  ?lidocaine (XYLOCAINE) 2 % viscous mouth solution 15 mL (15 mLs Mouth/Throat Given 06/28/21 1259)  ? ? ?ED Course/ Medical Decision Making/ A&P ?  ?                        ? ? ?51 y/o male with a pmh of pain with swallowing. ?DDX includes ludwigs angina, otitis media, dental pain. Esophagitis. ?I reviewed all labs which are unremarkable. ?I visualized CT soft tissue of the neck which shos no abnormalities. ?Patient had sig improvement with viscous lidocaine and I think the patient could potentially have pill esophagitis.  ?Will d/c with viscous lidocaine. ?Appears safe for d/c ? ?s) / ED Diagnoses ?Final diagnoses:  ?Odynophagia  ? ? ?Rx / DC Orders ?ED Discharge Orders   ? ?      Ordered  ?  lidocaine (XYLOCAINE) 2 % solution  Status:  Discontinued       ? 06/28/21 1323  ?  lidocaine (XYLOCAINE) 2 % solution       ? 06/28/21 1333  ? ?  ?  ? ?  ? ? ?   ?Arthor Captain, PA-C ?06/28/21 1801 ? ?  ?Arthor Captain, PA-C ?06/29/21 1647 ? ?  ?Sloan Leiter, DO ?06/30/21 1620 ? ?

## 2021-06-28 NOTE — Discharge Instructions (Signed)
Contact a health care provider if: ?You have new symptoms. ?You have unexplained weight loss. ?You have difficulty swallowing, or it hurts to swallow. ?You have wheezing or a cough that does not go away. ?Your symptoms do not improve with treatment. ?You have frequent heartburn for more than two weeks. ?Get help right away if: ?You have sudden severe pain in your arms, neck, jaw, teeth, or back. ?You suddenly feel sweaty, dizzy, or light-headed. ?You have chest pain or shortness of breath. ?You vomit and the vomit is green, yellow, or black, or it looks like blood or coffee grounds. ?Your stool is red, bloody, or black. ?You have a fever. ?You cannot swallow, drink, or eat. ?

## 2021-06-28 NOTE — ED Triage Notes (Signed)
Pt reports right side throat pain with swollowing that extends from his right ear, down through his right throat and down right arm for 1 week. Pt had rightside wisdom tooth extracted 2 weeks ago and is taking pcn Rx. ?

## 2021-06-28 NOTE — Telephone Encounter (Signed)
Reason for Disposition ? [1] SEVERE pain (e.g., excruciating, unable to do any normal activities) AND [2] not improved 2 hours after pain medicine ? ?Answer Assessment - Initial Assessment Questions ?1. ONSET: "When did the muscle aches or body pains start?"  ?    R mouth- ear pain behind ear down to shoulder, hurts when swallows ?2. LOCATION: "What part of your body is hurting?" (e.g., entire body, arms, legs)  ?    R side of ear down to shoulder- arm ?3. SEVERITY: "How bad is the pain?" (Scale 1-10; or mild, moderate, severe) ?  - MILD (1-3): doesn't interfere with normal activities  ?  - MODERATE (4-7): interferes with normal activities or awakens from sleep  ?  - SEVERE (8-10):  excruciating pain, unable to do any normal activities  ?    severe ?4. CAUSE: "What do you think is causing the pains?" ?    unsure ?5. FEVER: "Have you been having fever?" ?    No ?6. OTHER SYMPTOMS: "Do you have any other symptoms?" (e.g., chest pain, weakness, rash, cold or flu symptoms, weight loss) ?    Hurts to swallow ?7. PREGNANCY: "Is there any chance you are pregnant?" "When was your last menstrual period?" ?    Na  ?8. TRAVEL: "Have you traveled out of the country in the last month?" (e.g., travel history, exposures) ? ?Protocols used: Muscle Aches and Body Pain-A-AH ? ?

## 2021-06-28 NOTE — Telephone Encounter (Signed)
?  Chief Complaint: pain below R ear/neck ?Symptoms: pain in R ear.neck, pain with swallowing ?Frequency: constant ?Pertinent Negatives: Patient denies fever ?Disposition: [] ED /[x] Urgent Care (no appt availability in office) / [] Appointment(In office/virtual)/ []  [facility]/ [] Home Care/ [] Refused Recommended Disposition /[]  Mobile Bus/ []  Follow-up with PCP ?Additional Notes: Patient advised ED?UC for evaluation of pain  ?

## 2021-08-03 ENCOUNTER — Other Ambulatory Visit (INDEPENDENT_AMBULATORY_CARE_PROVIDER_SITE_OTHER): Payer: BC Managed Care – PPO

## 2021-08-03 ENCOUNTER — Encounter: Payer: Self-pay | Admitting: Gastroenterology

## 2021-08-03 ENCOUNTER — Ambulatory Visit: Payer: BC Managed Care – PPO | Admitting: Gastroenterology

## 2021-08-03 VITALS — BP 122/88 | HR 70 | Ht 67.0 in | Wt 177.2 lb

## 2021-08-03 DIAGNOSIS — R110 Nausea: Secondary | ICD-10-CM | POA: Diagnosis not present

## 2021-08-03 DIAGNOSIS — K219 Gastro-esophageal reflux disease without esophagitis: Secondary | ICD-10-CM | POA: Diagnosis not present

## 2021-08-03 DIAGNOSIS — K5900 Constipation, unspecified: Secondary | ICD-10-CM

## 2021-08-03 DIAGNOSIS — R1319 Other dysphagia: Secondary | ICD-10-CM

## 2021-08-03 DIAGNOSIS — R634 Abnormal weight loss: Secondary | ICD-10-CM | POA: Diagnosis not present

## 2021-08-03 DIAGNOSIS — R0789 Other chest pain: Secondary | ICD-10-CM

## 2021-08-03 LAB — COMPREHENSIVE METABOLIC PANEL
ALT: 49 U/L (ref 0–53)
AST: 28 U/L (ref 0–37)
Albumin: 4.7 g/dL (ref 3.5–5.2)
Alkaline Phosphatase: 68 U/L (ref 39–117)
BUN: 14 mg/dL (ref 6–23)
CO2: 30 mEq/L (ref 19–32)
Calcium: 9.2 mg/dL (ref 8.4–10.5)
Chloride: 103 mEq/L (ref 96–112)
Creatinine, Ser: 1.13 mg/dL (ref 0.40–1.50)
GFR: 75.02 mL/min (ref >60.00)
Glucose, Bld: 93 mg/dL (ref 70–99)
Potassium: 4 mEq/L (ref 3.5–5.1)
Sodium: 139 mEq/L (ref 135–145)
Total Bilirubin: 0.7 mg/dL (ref 0.2–1.2)
Total Protein: 7.5 g/dL (ref 6.0–8.3)

## 2021-08-03 LAB — CBC
HCT: 48.2 % (ref 39.0–52.0)
Hemoglobin: 16.7 g/dL (ref 13.0–17.0)
MCHC: 34.6 g/dL (ref 30.0–36.0)
MCV: 86.5 fl (ref 78.0–100.0)
Platelets: 226 10*3/uL (ref 150.0–400.0)
RBC: 5.57 Mil/uL (ref 4.22–5.81)
RDW: 13.1 % (ref 11.5–15.5)
WBC: 8.5 10*3/uL (ref 4.0–10.5)

## 2021-08-03 LAB — SEDIMENTATION RATE: Sed Rate: 5 mm/hr (ref 0–20)

## 2021-08-03 LAB — TSH: TSH: 1.57 u[IU]/mL (ref 0.35–5.50)

## 2021-08-03 LAB — C-REACTIVE PROTEIN: CRP: <1 mg/dL (ref 0.5–20.0)

## 2021-08-03 MED ORDER — PANTOPRAZOLE SODIUM 40 MG PO TBEC: 40.0000 mg | DELAYED_RELEASE_TABLET | Freq: Every day | ORAL | 1 refills | Status: DC

## 2021-08-03 MED ORDER — CITRUCEL PO POWD: 1.0000 | Freq: Every day | ORAL | Status: DC

## 2021-08-03 MED ORDER — ONDANSETRON HCL 4 MG PO TABS: 4.0000 mg | ORAL_TABLET | Freq: Two times a day (BID) | ORAL | 6 refills | Status: DC | PRN

## 2021-08-03 NOTE — Progress Notes (Signed)
I have seen him once in the office about 10 years ago.  Since then he has been seen exclusively by our extenders.  He has undergone several procedures which I have helped with his well.   07/2011 EGD showed non-bleeding Mallory Weiss tear, mild duodenitis and also nodularity at GE junction, biopsies actually showed inflamed cardia. No h.pylori.  11/2017 EGD LA grade A esophagitis, ortherwise normal 09/2018 EGD LA Grade A esophagitis , mild stenosis at GEJ dilated to 33mm- had laryngeal spasm and unable to proceed further. Pathology shows gastritis, no dysplasia.  08/2011 colonoscopy normal other than hemorrhoids, EGD 04/2021 Dr. Ardis Hughs indication dysphagia, findings esophagitis, focal peptic appearing stricture that was biopsied.  Biopsies only showed excessive eosinophils but no sign of tumor or cancer.  GE junction was dilated to 20 mm, he was advised to stay on PPI twice daily for at least 2 or 3 months and then back down to once daily.   HPI: This is a very pleasant 51 year old man who is here with his wife today.  I have seen him once in the office setting and that was about 10 years ago.  Since then he has been seen exclusively by our extenders.  He has undergone several procedures which I have helped with his well.  I last saw him at the time of EGD about 3 months ago.  See that results summarized above.  Blood work 06/2021 CBC was normal CT soft tissue neck with contrast 06/2021 indication "right side pain when swallowing" findings essentially normal however did show he was "partially edentulous with multiple caries"  He tells me since the EGD dilation and proton pump inhibitor and his swallowing is much better.  He has no trouble with food hanging or catching anymore.  When he is most bothered by however is chronic nausea with intermittent vomiting.  He is nauseous often throughout the week.  About 3-4 times per week he will vomit this occurs very shortly after completely finishing a meal.   He has been losing weight.  He thinks his work clothes are fitting looser.  This nausea and intermittent vomiting has been going on for at least 2 years.  He has no abdominal pains.  He is also bothered by minor intermittent rectal bleeding.  He does think he has some hemorrhoids on his backside.  He has constipation periodically, will have to push and strain to move his bowels.  He almost never has diarrhea.  Colon cancer does not run in his family   ROS: complete GI ROS as described in HPI, all other review negative.  Constitutional:  No unintentional weight loss   Past Medical History:  Diagnosis Date   GERD (gastroesophageal reflux disease)    Headache(784.0)    History of stomach ulcers    Lateral meniscus tear    Right   Mallory - Weiss tear    Migraine with aura    MVP (mitral valve prolapse)    Rectal bleeding     Past Surgical History:  Procedure Laterality Date   APPENDECTOMY  11/12/1988   BIOPSY  09/03/2018   Procedure: BIOPSY;  Surgeon: Jackquline Denmark, MD;  Location: [facility];  Service: Endoscopy;;   ELBOW SURGERY  03/14/1985   Right   ESOPHAGOGASTRODUODENOSCOPY  08/18/2011   Procedure: ESOPHAGOGASTRODUODENOSCOPY (EGD);  Surgeon: Jerene Bears, MD;  Location: [facility];  Service: Gastroenterology;  Laterality: N/A;   ESOPHAGOGASTRODUODENOSCOPY  11/11/2011   Procedure: ESOPHAGOGASTRODUODENOSCOPY (EGD);  Surgeon: Gatha Mayer, MD;  Location:  [facility];  Service: Endoscopy;  Laterality: N/A;   ESOPHAGOGASTRODUODENOSCOPY (EGD) WITH PROPOFOL N/A 09/03/2018   Procedure: ESOPHAGOGASTRODUODENOSCOPY (EGD) WITH PROPOFOL;  Surgeon: Jackquline Denmark, MD;  [facility]: [facility];  Service: Endoscopy;  Laterality: N/A;   KNEE ARTHROSCOPY     Right   SHOULDER SURGERY  03/14/2010   right rotator cuff   teeth removal     decaying    Current Outpatient Medications  Medication Instructions   lidocaine (XYLOCAINE) 2 % solution Swallow 10 ml every 4-6 hours as needed for  pain with swallowing   pantoprazole (PROTONIX) 40 mg, Oral, 2 times daily before meals    Allergies as of 08/03/2021 - Review Complete 08/03/2021  Allergen Reaction Noted   Codeine Other (See Comments)    Hydrocodone-acetaminophen Other (See Comments)    Food  10/18/2015   Ibuprofen  12/28/2016   Compazine [prochlorperazine edisylate] Anxiety 08/02/2011    Family History  Problem Relation Age of Onset   Hypertension Mother    Stroke Mother    Migraines Mother    Diabetes Mother    Prostate cancer Father    Hypertension Father    Stroke Father    Heart failure Father    Asthma Father    Colon polyps Father    Colon cancer Neg Hx    Esophageal cancer Neg Hx    Stomach cancer Neg Hx    Rectal cancer Neg Hx     Social History   Socioeconomic History   Marital status: Married    Spouse name: Vickie   Number of children: 0   Years of education: Not on file   Highest education level: Not on file  Occupational History   Occupation: Airline pilot, since 2002    Employer: [employer]  Tobacco Use   Smoking status: Never   Smokeless tobacco: Never  Vaping Use   Vaping Use: Never used  Substance and Sexual Activity   Alcohol use: No   Drug use: No   Sexual activity: Not on file  Other Topics Concern   Not on file  Social History Narrative   Not on file   Social Determinants of Health   Financial Resource Strain: Not on file  Food Insecurity: Not on file  Transportation Needs: Not on file  Physical Activity: Not on file  Stress: Not on file  Social Connections: Not on file  Intimate Partner Violence: Not on file     Physical Exam: BP 122/88   Pulse 70   Ht 5\' 7"  (1.702 m)   Wt 177 lb 4 oz (80.4 kg)   BMI 27.76 kg/m  Constitutional: generally well-appearing Psychiatric: alert and oriented x3 Abdomen: soft, nontender, nondistended, no obvious ascites, no peritoneal signs, normal bowel sounds No peripheral edema noted in lower  extremities  Assessment and plan: 51 y.o. male with improved dysphagia, still chronic several times a week nausea and vomiting, constipation, intermittent rectal bleeding, unintentional weight loss  His dysphagia seems improved, his biggest complaint is chronic nausea and intermittent vomiting.  This has been going on for at least 2 years.  It will occur 3-4 times per week.  Unclear etiology, certainly the findings on his recent EGD do not explain it.  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  Explained that he needs more testing.  I Minna start with blood work including a  CBC, complete metabolic profile, TSH, sed rate and CRP.  Calling him in a prescription for Zofran 4 mg pills 1 pill twice daily as needed.  Having him decrease his proton pump inhibitor once daily only.  We will arrange a CT scan abdomen pelvis, I am especially concerned that we should rule out occult malignancy given his unexplained weight loss.  He will start a fiber supplement for his mild intermittent constipation and he needs a colonoscopy for his minor intermittent rectal bleeding, likely hemorrhoidal.  He has not had a colonoscopy in about 10 years now  Please see the "Patient Instructions" section for addition details about the plan.  Owens Loffler, MD [facility] 08/03/2021, 3:34 PM   Total time on date of encounter was 35 minutes (this included time spent preparing to see the patient reviewing records; obtaining and/or reviewing separately obtained history; performing a medically appropriate exam and/or evaluation; counseling and educating the patient and family if present; ordering medications, tests or procedures if applicable; and documenting clinical information in the health record).

## 2021-08-03 NOTE — Patient Instructions (Signed)
If you are age 52 or younger, your body mass index should be between 19-25. Your Body mass index is 27.76 kg/m. If this is out of the aformentioned range listed, please consider follow up with your Primary Care Provider.  ________________________________________________________  The [facility] providers would like to encourage you to use [facility] to communicate with providers for non-urgent requests or questions.  Due to long hold times on the telephone, sending your provider a message by [facility] may be a faster and more efficient way to get a response.  Please allow 48 business hours for a response.  Please remember that this is for non-urgent requests.  _______________________________________________________  Your provider has requested that you go to the basement level for lab work before leaving today. Press "B" on the elevator. The lab is located at the first door on the left as you exit the elevator.  We have sent the following medications to your pharmacy for you to pick up at your convenience:  START: Zofran 4mg  one tablet twice daily as needed for nausea.  DECREASE: pantoprazole 40mg  to one tablet daily.  You have been scheduled for a CT scan of the abdomen and pelvis at [facility] ([address]---this is in the same building as [place]).   You are scheduled on 08-11-21 at 3:30pm. You should arrive 15 minutes prior to your appointment time for registration. Please follow the written instructions below on the day of your exam:  WARNING: IF YOU ARE ALLERGIC TO IODINE/X-RAY DYE, PLEASE NOTIFY RADIOLOGY IMMEDIATELY AT [phone]! YOU WILL BE GIVEN A 13 HOUR PREMEDICATION PREP.  1) Do not eat anything after 11:30am (4 hours prior to your test) 2) You have been given 2 bottles of oral contrast to drink. The solution may taste better if refrigerated, but do NOT add ice or any other liquid to this solution. Shake well before drinking.    Drink 1  bottle of contrast @ 1:30pm (2 hours prior to your exam)  Drink 1 bottle of contrast @ 2:30pm (1 hour prior to your exam)  You may take any medications as prescribed with a small amount of water, if necessary. If you take any of the following medications: METFORMIN, GLUCOPHAGE, GLUCOVANCE, AVANDAMET, RIOMET, FORTAMET, [place] MET, JANUMET, GLUMETZA or METAGLIP, you MAY be asked to HOLD this medication 48 hours AFTER the exam.  The purpose of you drinking the oral contrast is to aid in the visualization of your intestinal tract. The contrast solution may cause some diarrhea. Depending on your individual set of symptoms, you may also receive an intravenous injection of x-ray contrast/dye. Plan on being at [facility] for 30 minutes or longer, depending on the type of exam you are having performed.  This test typically takes 30-45 minutes to complete.  If you have any questions regarding your exam or if you need to reschedule, you may call the CT department at [phone] between the hours of 8:00 am and 5:00 pm, Monday-Friday.  ________________________________________________________________________  Bobby Mcbride have been scheduled for a colonoscopy. Please follow written instructions given to you at your visit today.  Please pick up your prep supplies at the pharmacy within the next 1-3 days. If you use inhalers (even only as needed), please bring them with you on the day of your procedure.  Due to recent changes in healthcare laws, you may see the results of your imaging and laboratory studies on MyChart before your provider has had a chance to review them.  We understand that in some cases there may be results that are confusing or concerning to you. Not all laboratory results come back in the same time frame and the provider may be waiting for multiple results in order to interpret others.  Please give [place] 48 hours in order for your provider to thoroughly review all the results before contacting  the office for clarification of your results.   Please start taking citrucel (orange flavored) powder fiber supplement.  This may cause some bloating at first but that usually goes away. Begin with a small spoonful and work your way up to a large, heaping spoonful daily over a week.  Thank you for entrusting me with your care and choosing [facility].  Dr Ardis Hughs

## 2021-08-05 ENCOUNTER — Encounter: Payer: Self-pay | Admitting: Nurse Practitioner

## 2021-08-05 ENCOUNTER — Ambulatory Visit: Payer: BC Managed Care – PPO | Admitting: Nurse Practitioner

## 2021-08-05 VITALS — BP 124/72 | HR 72 | Temp 97.7°F | Resp 16 | Ht 67.0 in | Wt 181.1 lb

## 2021-08-05 DIAGNOSIS — R058 Other specified cough: Secondary | ICD-10-CM | POA: Diagnosis not present

## 2021-08-05 DIAGNOSIS — R5383 Other fatigue: Secondary | ICD-10-CM | POA: Insufficient documentation

## 2021-08-05 DIAGNOSIS — R112 Nausea with vomiting, unspecified: Secondary | ICD-10-CM | POA: Diagnosis not present

## 2021-08-05 DIAGNOSIS — Z0000 Encounter for general adult medical examination without abnormal findings: Secondary | ICD-10-CM | POA: Diagnosis not present

## 2021-08-05 DIAGNOSIS — R059 Cough, unspecified: Secondary | ICD-10-CM | POA: Insufficient documentation

## 2021-08-05 NOTE — Assessment & Plan Note (Signed)
Lab work within normal limits including CBC TSH.  Patient likely having some form of sleep apnea or restless leg syndrome.  Ambulatory referral sent to [facility] pulmonology for further work-up

## 2021-08-05 NOTE — Progress Notes (Signed)
New Patient Office Visit  Subjective    Patient ID: Bobby Mcbride, male    DOB: 06-02-1970  Age: 51 y.o. MRN: [id number]  CC:  Chief Complaint  Patient presents with   Establish Care    Previous PCP with Dr Michail Jewels been getting physicals through his work-does see Dr Elwyn Reach    HPI Bobby Mcbride presents to establish care  Dr. Ardis Hughs (gastrointestinal doctor): Following patient for neck nausea vomiting.  Also patient having weight loss unintentionally.  Patient does have CT scan scheduled early next month along with colonoscopy later next month.  Cough: years. Already on protonix. No smoking. Feels shortness of breath with coughing  No SHOB without coughing. Drinking fluids    for complete physical and follow up of chronic conditions.  Immunizations: -Tetanus: within 10 years -Influenza: Out of season -Covid-19: refused -Shingles:  refused -Pneumonia: N/A  -HPV: Staff  Diet: Fair diet. 3 meals daily, depends on belly. Sodas. Sometimes body armor and  Exercise: No regular exercise. Walking 30 minutes-1 hour  2-3 days a week  Eye exam:  Glasses been 2 years [facility] Dental exam: Completes semi-annually . [facility] and Dr. Nicki Reaper [facility]    Colonoscopy: Followed by Dr. 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  Number within normal limits  Sleep: 9-10 and get up at [place]. Does not feel rested. Snores on occasion. Just wakes up and sometimes     Outpatient Encounter Medications as of 08/05/2021  Medication Sig   lidocaine (XYLOCAINE) 2 % solution Swallow 10 ml every 4-6 hours as needed for pain with swallowing   methylcellulose (CITRUCEL) oral powder Take 1 packet by mouth daily.   ondansetron (ZOFRAN) 4 MG tablet Take 1 tablet (4 mg total) by mouth 2 (two) times daily as needed for nausea or vomiting.   pantoprazole (PROTONIX) 40 MG tablet Take 1 tablet (40 mg  total) by mouth daily.   No facility-administered encounter medications on file as of 08/05/2021.    Past Medical History:  Diagnosis Date   GERD (gastroesophageal reflux disease)    Headache(784.0)    History of stomach ulcers    Lateral meniscus tear    Right   Mallory - Weiss tear    Migraine with aura    Mitral valve prolapse    MVP (mitral valve prolapse)    Rectal bleeding     Past Surgical History:  Procedure Laterality Date   APPENDECTOMY  11/12/1988   BIOPSY  09/03/2018   Procedure: BIOPSY;  Surgeon: Jackquline Denmark, MD;  [facility]: [facility];  Service: Endoscopy;;   ELBOW SURGERY  03/14/1985   Right   ESOPHAGOGASTRODUODENOSCOPY  08/18/2011   Procedure: ESOPHAGOGASTRODUODENOSCOPY (EGD);  Surgeon: Jerene Bears, MD;  Location: [facility];  Service: Gastroenterology;  Laterality: N/A;   ESOPHAGOGASTRODUODENOSCOPY  11/11/2011   Procedure: ESOPHAGOGASTRODUODENOSCOPY (EGD);  Surgeon: Gatha Mayer, MD;  Location: [facility];  Service: Endoscopy;  Laterality: N/A;   ESOPHAGOGASTRODUODENOSCOPY (EGD) WITH PROPOFOL N/A 09/03/2018   Procedure: ESOPHAGOGASTRODUODENOSCOPY (EGD) WITH PROPOFOL;  Surgeon: Jackquline Denmark, MD;  [facility];  Service: Endoscopy;  Laterality: N/A;   KNEE ARTHROSCOPY     Right   SHOULDER SURGERY  03/14/2010   right rotator cuff   teeth removal     decaying    Family History  Problem Relation Age of Onset   Hypertension Mother    Stroke Mother  Migraines Mother    Diabetes Mother    Prostate cancer Father    Hypertension Father    Stroke Father    Heart failure Father    Asthma Father    Colon polyps Father    Colon cancer Neg Hx    Esophageal cancer Neg Hx    Stomach cancer Neg Hx    Rectal cancer Neg Hx     Social History   Socioeconomic History   Marital status: Married    Spouse name: Vickie   Number of children: 0   Years of education: Not on file   Highest education level: Not on file  Occupational History    Occupation: IT sales professional, since 2002    Employer: [employer]  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 Negative for chills and fever.  Respiratory:  Positive for cough and shortness of breath (with coughing).   Cardiovascular:  Negative for chest pain and leg swelling.  Gastrointestinal:  Positive for nausea and vomiting. Negative for abdominal pain.       BM daily  Genitourinary:  Negative for dysuria and frequency.  Neurological:  Positive for tingling.  Psychiatric/Behavioral:  Negative for hallucinations and suicidal ideas.        Objective    BP 124/72   Pulse 72   Temp 97.7 F (36.5 C)   Resp 16   Ht 5\' 7"  (1.702 m)   Wt 181 lb 2 oz (82.2 kg)   SpO2 98%   BMI 28.37 kg/m   Physical Exam Vitals and nursing note reviewed. Exam conducted with a chaperone present [facility] [place], RMA).  Constitutional:      Appearance: Normal appearance.  HENT:     Right Ear: Tympanic membrane, ear canal and external ear normal.     Left Ear: Tympanic membrane, ear canal and external ear normal.     Mouth/Throat:     Mouth: Mucous membranes are moist.     Pharynx: Oropharynx is clear.  Eyes:     Extraocular Movements: Extraocular movements intact.     Pupils: Pupils are equal, round, and reactive to light.  Cardiovascular:     Rate and Rhythm: Normal rate and regular  rhythm.     Pulses: Normal pulses.     Heart sounds: Normal heart sounds.  Pulmonary:     Effort: Pulmonary effort is normal.     Breath sounds: Normal breath sounds.  Abdominal:     General: Bowel sounds are normal. There is no distension.     Palpations: There is no mass.     Tenderness: There is no abdominal tenderness.     Hernia: No hernia is present. There is no hernia in the left inguinal area or right inguinal area.  Genitourinary:    Penis: Normal and circumcised.  Testes: Normal.     Epididymis:     Right: Normal.     Left: Normal.  Musculoskeletal:     Right lower leg: No edema.     Left lower leg: No edema.  Lymphadenopathy:     Cervical: No cervical adenopathy.     Lower Body: No right inguinal adenopathy. No left inguinal adenopathy.  Skin:    Comments: Bilateral upper and lower extremity strength 5/5  Neurological:     General: No focal deficit present.     Mental Status: He is alert.     Deep Tendon Reflexes:     Reflex Scores:      Bicep reflexes are 2+ on the right side and 2+ on the left side.      Patellar reflexes are 2+ on the right side and 2+ on the left side.    Comments: Bilateral upper and lower extremity extremity 5/5  Psychiatric:        Mood and Affect: Mood normal.        Behavior: Behavior normal.        Thought Content: Thought content normal.        Judgment: Judgment normal.        Assessment & Plan:   Problem List Items Addressed This Visit       Digestive   Nausea and vomiting    Currently being managed by Dr. Owens Loffler through GI         Other   Cough    Ambiguous cough that causes shortness of breath with coughing jags but no shortness of breath at rest or with exertion.  We will have patient try the counter generic antihistamine such as Claritin or Zyrtec.  See if this is beneficial.  Follow-up if no improvement       Other fatigue    Lab work within normal limits including CBC TSH.  Patient likely having  some form of sleep apnea or restless leg syndrome.  Ambulatory referral sent to [facility] pulmonology for further work-up       Relevant Orders   Ambulatory referral to Pulmonology   Preventative health care - Primary    Discussed age-appropriate immunizations and screening exams.        Return in about 6 months (around 02/05/2022) for Recheck on conditions.   Romilda Garret, NP

## 2021-08-05 NOTE — Assessment & Plan Note (Addendum)
Ambiguous cough that causes shortness of breath with coughing jags but no shortness of breath at rest or with exertion.  We will have patient try the counter generic antihistamine such as Claritin or Zyrtec.  See if this is beneficial.  Follow-up if no improvement

## 2021-08-05 NOTE — Assessment & Plan Note (Signed)
Currently being managed by Dr. Rob Bunting through GI

## 2021-08-05 NOTE — Patient Instructions (Signed)
Nice to see you today I recommend you take over the counter Claritin or zyrtec to see if that helps with the cough I went ahead and placed a referral for the sleep study. Do it when you are ready Follow up with me in 6 months, sooner if you need me

## 2021-08-05 NOTE — Assessment & Plan Note (Signed)
Discussed age-appropriate immunizations and screening exams. 

## 2021-08-06 ENCOUNTER — Telehealth: Payer: Self-pay | Admitting: Nurse Practitioner

## 2021-08-06 DIAGNOSIS — R058 Other specified cough: Secondary | ICD-10-CM

## 2021-08-06 MED ORDER — FLUTICASONE PROPIONATE 50 MCG/ACT NA SUSP: 2.0000 | Freq: Every day | NASAL | 0 refills | Status: DC

## 2021-08-06 NOTE — Telephone Encounter (Signed)
I recommended that patient try an over the counter antihistamine like zytrec or claritin. I can also send in some Flonase to see if this helps with the cough.

## 2021-08-06 NOTE — Telephone Encounter (Signed)
Bobby Mcbride advised. Patient will try suggestions per recommendations provider.

## 2021-08-06 NOTE — Telephone Encounter (Signed)
Pt wife called and pt was seen yesterday and his cough just keeps getting worse, she said he was coughing all night long and she just wants to know what we can do to help because she is afraid its going to hurt his ribs or heart. Please advise. Call back is [phone]

## 2021-08-07 ENCOUNTER — Emergency Department (HOSPITAL_COMMUNITY)
Admission: EM | Admit: 2021-08-07 | Discharge: 2021-08-07 | Disposition: A | Discharge status: Home / Self Care | Payer: BC Managed Care – PPO | Attending: Emergency Medicine | Admitting: Emergency Medicine

## 2021-08-07 ENCOUNTER — Emergency Department (HOSPITAL_COMMUNITY): Payer: BC Managed Care – PPO

## 2021-08-07 ENCOUNTER — Other Ambulatory Visit: Payer: Self-pay

## 2021-08-07 ENCOUNTER — Encounter (HOSPITAL_COMMUNITY): Payer: Self-pay | Admitting: Pharmacy Technician

## 2021-08-07 DIAGNOSIS — R079 Chest pain, unspecified: Secondary | ICD-10-CM | POA: Insufficient documentation

## 2021-08-07 DIAGNOSIS — R051 Acute cough: Secondary | ICD-10-CM | POA: Diagnosis not present

## 2021-08-07 DIAGNOSIS — Z20822 Contact with and (suspected) exposure to covid-19: Secondary | ICD-10-CM | POA: Diagnosis not present

## 2021-08-07 DIAGNOSIS — R059 Cough, unspecified: Secondary | ICD-10-CM | POA: Diagnosis not present

## 2021-08-07 LAB — CBC WITH DIFFERENTIAL/PLATELET
Abs Immature Granulocytes: 0.03 10*3/uL (ref 0.00–0.07)
Basophils Absolute: 0 10*3/uL (ref 0.0–0.1)
Basophils Relative: 0 %
Eosinophils Absolute: 0.6 10*3/uL — ABNORMAL HIGH (ref 0.0–0.5)
Eosinophils Relative: 6 %
HCT: 49.5 % (ref 39.0–52.0)
Hemoglobin: 17.2 g/dL — ABNORMAL HIGH (ref 13.0–17.0)
Immature Granulocytes: 0 %
Lymphocytes Relative: 12 %
Lymphs Abs: 1.3 10*3/uL (ref 0.7–4.0)
MCH: 29.9 pg (ref 26.0–34.0)
MCHC: 34.7 g/dL (ref 30.0–36.0)
MCV: 85.9 fL (ref 80.0–100.0)
Monocytes Absolute: 1.4 10*3/uL — ABNORMAL HIGH (ref 0.1–1.0)
Monocytes Relative: 13 %
Neutro Abs: 7.3 10*3/uL (ref 1.7–7.7)
Neutrophils Relative %: 69 %
Platelets: 221 10*3/uL (ref 150–400)
RBC: 5.76 MIL/uL (ref 4.22–5.81)
RDW: 13.2 % (ref 11.5–15.5)
WBC: 10.6 10*3/uL — ABNORMAL HIGH (ref 4.0–10.5)
nRBC: 0 % (ref 0.0–0.2)

## 2021-08-07 LAB — RESP PANEL BY RT-PCR (FLU A&B, COVID) ARPGX2
Influenza A by PCR: NEGATIVE
Influenza B by PCR: NEGATIVE
SARS Coronavirus 2 by RT PCR: NEGATIVE

## 2021-08-07 LAB — BASIC METABOLIC PANEL
Anion gap: 7 (ref 5–15)
BUN: 11 mg/dL (ref 6–20)
CO2: 25 mmol/L (ref 22–32)
Calcium: 8.9 mg/dL (ref 8.9–10.3)
Chloride: 106 mmol/L (ref 98–111)
Creatinine, Ser: 1.4 mg/dL — ABNORMAL HIGH (ref 0.61–1.24)
GFR, Estimated: >60 mL/min (ref >60)
Glucose, Bld: 103 mg/dL — ABNORMAL HIGH (ref 70–99)
Potassium: 4.1 mmol/L (ref 3.5–5.1)
Sodium: 138 mmol/L (ref 135–145)

## 2021-08-07 LAB — TROPONIN I (HIGH SENSITIVITY): Troponin I (High Sensitivity): 4 ng/L (ref <18)

## 2021-08-07 MED ORDER — BENZONATATE 100 MG PO CAPS: 100.0000 mg | ORAL_CAPSULE | Freq: Three times a day (TID) | ORAL | 0 refills | Status: DC

## 2021-08-07 MED ORDER — MORPHINE SULFATE (PF) 4 MG/ML IV SOLN
4.0000 mg | Freq: Once | INTRAVENOUS | Status: AC
  Administered 2021-08-07: 4 mg via INTRAVENOUS
  Filled 2021-08-07: qty 1

## 2021-08-07 MED ORDER — OXYCODONE-ACETAMINOPHEN 5-325 MG PO TABS: 1.0000 | ORAL_TABLET | Freq: Four times a day (QID) | ORAL | 0 refills | Status: DC | PRN

## 2021-08-07 MED ORDER — ONDANSETRON HCL 4 MG/2ML IJ SOLN
4.0000 mg | Freq: Once | INTRAMUSCULAR | Status: AC
  Administered 2021-08-07: 4 mg via INTRAVENOUS
  Filled 2021-08-07: qty 2

## 2021-08-07 MED ORDER — SODIUM CHLORIDE 0.9 % IV BOLUS
1000.0000 mL | Freq: Once | INTRAVENOUS | Status: AC
  Administered 2021-08-07: 1000 mL via INTRAVENOUS

## 2021-08-07 MED ORDER — PREDNISONE 20 MG PO TABS: 20.0000 mg | ORAL_TABLET | Freq: Two times a day (BID) | ORAL | 0 refills | Status: AC

## 2021-08-07 NOTE — Discharge Instructions (Signed)
Call your primary care doctor or specialist as discussed in the next 2-3 days.   Return immediately back to the ER if:  Your symptoms worsen within the next 12-24 hours. You develop new symptoms such as new fevers, persistent vomiting, new pain, shortness of breath, or new weakness or numbness, or if you have any other concerns.  

## 2021-08-07 NOTE — ED Notes (Signed)
Patient Alert and oriented to baseline. Stable and ambulatory to baseline. Patient verbalized understanding of the discharge instructions.  Patient belongings were taken by the patient.   

## 2021-08-07 NOTE — ED Triage Notes (Signed)
Pt here POV with reports of cough and chest pain. Pt taking OTC meds without relief.

## 2021-08-11 ENCOUNTER — Ambulatory Visit (INDEPENDENT_AMBULATORY_CARE_PROVIDER_SITE_OTHER)
Admission: RE | Admit: 2021-08-11 | Discharge: 2021-08-11 | Disposition: A | Discharge status: Home / Self Care | Payer: BC Managed Care – PPO | Source: Ambulatory Visit | Attending: Gastroenterology | Admitting: Gastroenterology

## 2021-08-11 DIAGNOSIS — R110 Nausea: Secondary | ICD-10-CM

## 2021-08-11 DIAGNOSIS — R1110 Vomiting, unspecified: Secondary | ICD-10-CM | POA: Diagnosis not present

## 2021-08-11 DIAGNOSIS — K5900 Constipation, unspecified: Secondary | ICD-10-CM | POA: Diagnosis not present

## 2021-08-11 DIAGNOSIS — R634 Abnormal weight loss: Secondary | ICD-10-CM | POA: Diagnosis not present

## 2021-08-11 MED ORDER — IOHEXOL 300 MG/ML  SOLN
100.0000 mL | Freq: Once | INTRAMUSCULAR | Status: AC | PRN
  Administered 2021-08-11: 100 mL via INTRAVENOUS

## 2021-08-12 ENCOUNTER — Telehealth: Payer: Self-pay | Admitting: Gastroenterology

## 2021-08-12 DIAGNOSIS — K200 Eosinophilic esophagitis: Secondary | ICD-10-CM

## 2021-08-12 HISTORY — DX: Eosinophilic esophagitis: K20.0

## 2021-08-12 NOTE — ED Provider Notes (Signed)
[facility] DEPT Provider Note   CSN: [id number] Arrival date & time: 08/07/21  [id number]     History  Chief Complaint  Patient presents with   Chest Pain   Cough    Bobby Mcbride is a 51 y.o. male.  Patient presents ER chief complaint of cough and severe chest pain when he coughs.  Between coughs he feels fine but he states every time he coughs he feels a sharp pain in his left chest last 1 to 2 seconds and then resolves.  Denies any fevers no vomiting or diarrhea.      Home Medications Prior to Admission medications   Medication Sig Start Date End Date Taking? Authorizing Provider  benzonatate (TESSALON) 100 MG capsule Take 1 capsule (100 mg total) by mouth every 8 (eight) hours. 08/07/21  Yes Tanayah Squitieri, Greggory Brandy, MD  oxyCODONE-acetaminophen (PERCOCET/ROXICET) 5-325 MG tablet Take 1 tablet by mouth every 6 (six) hours as needed for up to 4 doses for severe pain. 08/07/21  Yes Sakira Dahmer, Greggory Brandy, MD  predniSONE (DELTASONE) 20 MG tablet Take 1 tablet (20 mg total) by mouth 2 (two) times daily with a meal for 5 days. 08/07/21 08/12/21 Yes Luna Fuse, MD  fluticasone (FLONASE) 50 MCG/ACT nasal spray Place 2 sprays into both nostrils daily. 08/06/21   Michela Pitcher, NP  lidocaine (XYLOCAINE) 2 % solution Swallow 10 ml every 4-6 hours as needed for pain with swallowing 06/28/21   Dion Saucier A, PA  methylcellulose (CITRUCEL) oral powder Take 1 packet by mouth daily. 08/03/21   Milus Banister, MD  ondansetron (ZOFRAN) 4 MG tablet Take 1 tablet (4 mg total) by mouth 2 (two) times daily as needed for nausea or vomiting. 08/03/21   Milus Banister, MD  pantoprazole (PROTONIX) 40 MG tablet Take 1 tablet (40 mg total) by mouth daily. 08/03/21   Milus Banister, MD      Allergies    Codeine, Hydrocodone-acetaminophen, Food, Ibuprofen, and Compazine [prochlorperazine edisylate]    Review of Systems   Review of Systems  Constitutional:  Negative for fever.  HENT:   Negative for ear pain and sore throat.   Eyes:  Negative for pain.  Respiratory:  Positive for cough and shortness of breath.   Cardiovascular:  Positive for chest pain.  Gastrointestinal:  Negative for abdominal pain.  Genitourinary:  Negative for flank pain.  Musculoskeletal:  Negative for back pain.  Skin:  Negative for color change and rash.  Neurological:  Negative for syncope.  All other systems reviewed and are negative.  Physical Exam Updated Vital Signs BP 132/89 (BP Location: Left Arm)   Pulse 73   Temp 98.4 F (36.9 C) (Oral)   Resp (!) 22   SpO2 96%  Physical Exam Constitutional:      Appearance: He is well-developed.  HENT:     Head: Normocephalic.     Nose: Nose normal.  Eyes:     Extraocular Movements: Extraocular movements intact.  Cardiovascular:     Rate and Rhythm: Normal rate.  Pulmonary:     Effort: Pulmonary effort is normal.  Skin:    Coloration: Skin is not jaundiced.  Neurological:     Mental Status: He is alert. Mental status is at baseline.    ED Results / Procedures / Treatments   Labs (all labs ordered are listed, but only abnormal results are displayed) Labs Reviewed  CBC WITH DIFFERENTIAL/PLATELET - Abnormal; Notable for the following components:  Result Value   WBC 10.6 (*)    Hemoglobin 17.2 (*)    Monocytes Absolute 1.4 (*)    Eosinophils Absolute 0.6 (*)    All other components within normal limits  BASIC METABOLIC PANEL - Abnormal; Notable for the following components:   Glucose, Bld 103 (*)    Creatinine, Ser 1.40 (*)    All other components within normal limits  RESP PANEL BY RT-PCR (FLU A&B, COVID) ARPGX2  TROPONIN I (HIGH SENSITIVITY)    EKG EKG Interpretation  Date/Time:  Saturday Aug 07 2021 07:33:57 EDT Ventricular Rate:  120 PR Interval:  130 QRS Duration: 85 QT Interval:  313 QTC Calculation: 443 R Axis:   -72 Text Interpretation: Sinus tachycardia Left anterior fascicular block RSR' in V1 or V2, right  VCD or RVH Baseline wander in lead(s) II Confirmed by Thamas Jaegers ([id number]) on 08/07/2021 8:05:17 AM  Radiology CT Abdomen Pelvis W Contrast  Result Date: 08/12/2021 CLINICAL DATA:  Postprandial nausea and vomiting for 6 weeks. Unintentional 10 lb weight loss. Constipation. EXAM: CT ABDOMEN AND PELVIS WITH CONTRAST TECHNIQUE: Multidetector CT imaging of the abdomen and pelvis was performed using the standard protocol following bolus administration of intravenous contrast. RADIATION DOSE REDUCTION: This exam was performed according to the departmental dose-optimization program which includes automated exposure control, adjustment of the mA and/or kV according to patient size and/or use of iterative reconstruction technique. CONTRAST:  115mL OMNIPAQUE IOHEXOL 300 MG/ML  SOLN COMPARISON:  01/30/2012 FINDINGS: Lower Chest: No acute findings. Hepatobiliary: No hepatic masses identified. Mild-to-moderate diffuse hepatic steatosis is increased since previous study. Gallbladder is unremarkable. No evidence of biliary ductal dilatation. Pancreas:  No mass or inflammatory changes. Spleen: Within normal limits in size and appearance. Adrenals/Urinary Tract: No masses identified. No evidence of ureteral calculi or hydronephrosis. Stomach/Bowel: No evidence of obstruction, inflammatory process or abnormal fluid collections. Vascular/Lymphatic: No pathologically enlarged lymph nodes. No acute vascular findings. Reproductive:  No mass or other significant abnormality. Other: Small fat-containing left inguinal hernia versus spermatic cord lipoma is new since prior exam. Musculoskeletal:  No suspicious bone lesions identified. IMPRESSION: No acute findings within the abdomen or pelvis. Increased hepatic steatosis. Small fat-containing left inguinal hernia versus spermatic cord lipoma. Electronically Signed   By: Marlaine Hind M.D.   On: 08/12/2021 13:57    Procedures Procedures    Medications Ordered in ED Medications   sodium chloride 0.9 % bolus 1,000 mL (0 mLs Intravenous Stopped 08/07/21 0949)  morphine (PF) 4 MG/ML injection 4 mg (4 mg Intravenous Given 08/07/21 0757)  ondansetron (ZOFRAN) injection 4 mg (4 mg Intravenous Given 08/07/21 0757)    ED Course/ Medical Decision Making/ A&P                           Medical Decision Making Amount and/or Complexity of Data Reviewed Labs: ordered. Radiology: ordered.  Risk Prescription drug management.   Chart review shows office visit Aug 03, 2021 for preventive health visit.  Cardiac monitoring showing sinus rhythm.  Work-up includes labs CBC CMP White count 10 hemoglobin 13 chemistry unremarkable troponin is negative.  Viral panel is negative.  Chest is unremarkable for any signs of infection.  In the severity of his cough and pain, patient given medications, advised outpatient follow-up with his doctor this week.  Advised immediate return for worsening symptoms several to breathing or fevers.  Patient expressed understanding, discharged home in stable condition.  Final Clinical Impression(s) / ED Diagnoses Final diagnoses:  Acute cough    Rx / DC Orders ED Discharge Orders          Ordered    oxyCODONE-acetaminophen (PERCOCET/ROXICET) 5-325 MG tablet  Every 6 hours PRN        08/07/21 0941    benzonatate (TESSALON) 100 MG capsule  Every 8 hours        08/07/21 0941    predniSONE (DELTASONE) 20 MG tablet  2 times daily with meals        08/07/21 0941              Luna Fuse, MD 08/12/21 1646

## 2021-08-12 NOTE — Telephone Encounter (Signed)
The results have not been reviewed.  As soon as available the pt will be contacted

## 2021-08-12 NOTE — Telephone Encounter (Signed)
Good Morning,  Inbound call from patient regarding ct scan results from 08/11/21. Please give patient a call back to advise when those results are available.  Thank You

## 2021-08-13 ENCOUNTER — Other Ambulatory Visit: Payer: Self-pay

## 2021-08-13 DIAGNOSIS — R110 Nausea: Secondary | ICD-10-CM

## 2021-08-13 DIAGNOSIS — R1319 Other dysphagia: Secondary | ICD-10-CM

## 2021-08-13 DIAGNOSIS — R634 Abnormal weight loss: Secondary | ICD-10-CM

## 2021-08-23 LAB — PULMONARY FUNCTION TEST

## 2021-08-25 ENCOUNTER — Encounter: Payer: Self-pay | Admitting: Gastroenterology

## 2021-08-31 HISTORY — PX: COLONOSCOPY WITH ESOPHAGOGASTRODUODENOSCOPY (EGD): SHX5779

## 2021-09-01 ENCOUNTER — Ambulatory Visit (AMBULATORY_SURGERY_CENTER): Payer: BC Managed Care – PPO | Admitting: Gastroenterology

## 2021-09-01 ENCOUNTER — Encounter: Payer: Self-pay | Admitting: Gastroenterology

## 2021-09-01 VITALS — BP 113/75 | HR 63 | Temp 98.6°F | Resp 16 | Ht 67.0 in | Wt 177.0 lb

## 2021-09-01 DIAGNOSIS — K648 Other hemorrhoids: Secondary | ICD-10-CM | POA: Diagnosis not present

## 2021-09-01 DIAGNOSIS — K5900 Constipation, unspecified: Secondary | ICD-10-CM

## 2021-09-01 DIAGNOSIS — K3189 Other diseases of stomach and duodenum: Secondary | ICD-10-CM | POA: Diagnosis not present

## 2021-09-01 DIAGNOSIS — K2970 Gastritis, unspecified, without bleeding: Secondary | ICD-10-CM

## 2021-09-01 DIAGNOSIS — K2980 Duodenitis without bleeding: Secondary | ICD-10-CM | POA: Diagnosis not present

## 2021-09-01 DIAGNOSIS — R110 Nausea: Secondary | ICD-10-CM | POA: Diagnosis not present

## 2021-09-01 DIAGNOSIS — R634 Abnormal weight loss: Secondary | ICD-10-CM

## 2021-09-01 DIAGNOSIS — K625 Hemorrhage of anus and rectum: Secondary | ICD-10-CM

## 2021-09-01 DIAGNOSIS — K449 Diaphragmatic hernia without obstruction or gangrene: Secondary | ICD-10-CM | POA: Diagnosis not present

## 2021-09-01 MED ORDER — SODIUM CHLORIDE 0.9 % IV SOLN: 500.0000 mL | Freq: Once | INTRAVENOUS | Status: DC

## 2021-09-01 NOTE — Op Note (Addendum)
[facility] Patient Name: Bobby Mcbride Procedure Date: 09/01/2021 2:57 PM MRN: [id number] Endoscopist: Milus Banister , MD Age: 51 Referring MD:  Date of Birth: May 21, 1970 Gender: Male Account #: [id number] Procedure:                Colonoscopy Indications:              Constipation, weight loss, intermittent mild rectal                            bleeding Medicines:                Monitored Anesthesia Care Procedure:                Pre-Anesthesia Assessment:                           - Prior to the procedure, a History and Physical                            was performed, and patient medications and                            allergies were reviewed. The patient's tolerance of                            previous anesthesia was also reviewed. The risks                            and benefits of the procedure and the sedation                            options and risks were discussed with the patient.                            All questions were answered, and informed consent                            was obtained. Prior Anticoagulants: The patient has                            taken no previous anticoagulant or antiplatelet                            agents. ASA Grade Assessment: II - A patient with                            mild systemic disease. After reviewing the risks                            and benefits, the patient was deemed in                            satisfactory condition to undergo the procedure.  After obtaining informed consent, the colonoscope                            was passed under direct vision. Throughout the                            procedure, the patient's blood pressure, pulse, and                            oxygen saturations were monitored continuously. The                            CF [id number] #[id number] was introduced through the anus                            and advanced to the the terminal ileum. The                             colonoscopy was performed without difficulty. The                            patient tolerated the procedure well. The quality                            of the bowel preparation was good. The terminal                            ileum, ileocecal valve, appendiceal orifice, and                            rectum were photographed. Scope In: 3:05:17 PM Scope Out: 3:12:35 PM Scope Withdrawal Time: 0 hours 4 minutes 49 seconds  Total Procedure Duration: 0 hours 7 minutes 18 seconds  Findings:                 The terminal ileum appeared normal.                           The entire examined colon appeared normal on direct                            and retroflexion views.                           Small internal hemorrhoids.                           1cm rubbery perianal nodule. Complications:            No immediate complications. Estimated blood loss:                            None. Estimated Blood Loss:     Estimated blood loss: none. Impression:               - The examined portion of the ileum was normal.                           -  The entire examined colon is normal on direct and                            retroflexion views.                           - 1cm rubbery perianal nodule.                           - Small internal hemorrhoids. Recommendation:           - EGD now.                           - Repeat colonoscopy in 10 years for screening.                           - Dr. Christella Hartigan' office will arrange referral to                            [facility] for biopsy, ?resection of the small                            perianal nodule. Rachael Fee, MD 09/01/2021 3:16:13 PM This report has been signed electronically.

## 2021-09-01 NOTE — Progress Notes (Signed)
VS by CW  Pt's states no medical or surgical changes since previsit or office visit.  

## 2021-09-01 NOTE — Progress Notes (Signed)
Pt in recovery with monitors in place, VSS. Report given to receiving RN. Bite guard was placed with pt awake to ensure comfort. No dental or soft tissue damage noted. 

## 2021-09-01 NOTE — Progress Notes (Signed)
Called to room to assist during endoscopic procedure.  Patient ID and intended procedure confirmed with present staff. Received instructions for my participation in the procedure from the performing physician.  

## 2021-09-01 NOTE — Progress Notes (Signed)
  The recent H&P (dated 08/03/21) was reviewed, the patient was examined and there is no change in the patients condition since that H&P was completed.   Rachael Fee  09/01/2021, 2:50 PM

## 2021-09-01 NOTE — Op Note (Signed)
[facility] Patient Name: Bobby Mcbride Procedure Date: 09/01/2021 2:56 PM MRN: [id number] Endoscopist: Rachael Fee , MD Age: 51 Referring MD:  Date of Birth: 23-Sep-1970 Gender: Male Account #: [id number] Procedure:                Upper GI endoscopy Indications:              Nausea with vomiting, weight loss Medicines:                Monitored Anesthesia Care Procedure:                Pre-Anesthesia Assessment:                           - Prior to the procedure, a History and Physical                            was performed, and patient medications and                            allergies were reviewed. The patient's tolerance of                            previous anesthesia was also reviewed. The risks                            and benefits of the procedure and the sedation                            options and risks were discussed with the patient.                            All questions were answered, and informed consent                            was obtained. Prior Anticoagulants: The patient has                            taken no previous anticoagulant or antiplatelet                            agents. ASA Grade Assessment: II - A patient with                            mild systemic disease. After reviewing the risks                            and benefits, the patient was deemed in                            satisfactory condition to undergo the procedure.                           After obtaining informed consent, the endoscope was  passed under direct vision. Throughout the                            procedure, the patient's blood pressure, pulse, and                            oxygen saturations were monitored continuously. The                            Endoscope was introduced through the mouth, and                            advanced to the second part of duodenum. The upper                            GI endoscopy was  accomplished without difficulty.                            The patient tolerated the procedure well. Scope In: Scope Out: Findings:                 Mild inflammation characterized by erythema was                            found in the entire examined stomach. Biopsies were                            taken with a cold forceps for histology.                           Mild, non-specific bulbar duodenitis. Biopsies                            taken and sent to pathology.                           A small hiatal hernia was present.                           The exam was otherwise without abnormality. Complications:            No immediate complications. Estimated blood loss:                            None. Estimated Blood Loss:     Estimated blood loss: none. Impression:               - Mild, non-specific gastritis and duodenitis.                            Biopsies taken and sent to pathology.                           - Small hiatal hernia.                           -  The examination was otherwise normal. Recommendation:           - Patient has a contact number available for                            emergencies. The signs and symptoms of potential                            delayed complications were discussed with the                            patient. Return to normal activities tomorrow.                            Written discharge instructions were provided to the                            patient.                           - Resume previous diet.                           - Continue present medications.                           - Await pathology results. Rachael Fee, MD 09/01/2021 3:26:21 PM This report has been signed electronically.

## 2021-09-01 NOTE — Patient Instructions (Addendum)
Handouts provided on hemorrhoids, gastritis and hiatal hernia.   Dr. Christella Hartigan' office will arrange referral to [facility] for biopsy, ?resection of the small perianal nodule.   Repeat colonoscopy in 10 years for screening purposes.   YOU HAD AN ENDOSCOPIC PROCEDURE TODAY AT THE [facility]:   Refer to the procedure report that was given to you for any specific questions about what was found during the examination.  If the procedure report does not answer your questions, please call your gastroenterologist to clarify.  If you requested that your care partner not be given the details of your procedure findings, then the procedure report has been included in a sealed envelope for you to review at your convenience later.  YOU SHOULD EXPECT: Some feelings of bloating in the abdomen. Passage of more gas than usual.  Walking can help get rid of the air that was put into your GI tract during the procedure and reduce the bloating. If you had a lower endoscopy (such as a colonoscopy or flexible sigmoidoscopy) you may notice spotting of blood in your stool or on the toilet paper. If you underwent a bowel prep for your procedure, you may not have a normal bowel movement for a few days.  Please Note:  You might notice some irritation and congestion in your nose or some drainage.  This is from the oxygen used during your procedure.  There is no need for concern and it should clear up in a day or so.  SYMPTOMS TO REPORT IMMEDIATELY:  Following lower endoscopy (colonoscopy or flexible sigmoidoscopy):  Excessive amounts of blood in the stool  Significant tenderness or worsening of abdominal pains  Swelling of the abdomen that is new, acute  Fever of 100F or higher  Following upper endoscopy (EGD)  Vomiting of blood or coffee ground material  New chest pain or pain under the shoulder blades  Painful or persistently difficult swallowing  New shortness of breath  Fever of 100F or higher  Black,  tarry-looking stools  For urgent or emergent issues, a gastroenterologist can be reached at any hour by calling [phone]. Do not use MyChart messaging for urgent concerns.    DIET:  We do recommend a small meal at first, but then you may proceed to your regular diet.  Drink plenty of fluids but you should avoid alcoholic beverages for 24 hours.  ACTIVITY:  You should plan to take it easy for the rest of today and you should NOT DRIVE or use heavy machinery until tomorrow (because of the sedation medicines used during the test).    FOLLOW UP: Our staff will call the number listed on your records 24-72 hours following your procedure to check on you and address any questions or concerns that you may have regarding the information given to you following your procedure. If we do not reach you, we will leave a message.  We will attempt to reach you two times.  During this call, we will ask if you have developed any symptoms of COVID 19. If you develop any symptoms (ie: fever, flu-like symptoms, shortness of breath, cough etc.) before then, please call [phone].  If you test positive for Covid 19 in the 2 weeks post procedure, please call and report this information to [place].    If any biopsies were taken you will be contacted by phone or by letter within the next 1-3 weeks.  Please call us at [phone] if you have not heard about the biopsies in  3 weeks.    SIGNATURES/CONFIDENTIALITY: You and/or your care partner have signed paperwork which will be entered into your electronic medical record.  These signatures attest to the fact that that the information above on your After Visit Summary has been reviewed and is understood.  Full responsibility of the confidentiality of this discharge information lies with you and/or your care-partner.

## 2021-09-02 ENCOUNTER — Telehealth: Payer: Self-pay | Admitting: *Deleted

## 2021-09-02 ENCOUNTER — Telehealth: Payer: Self-pay

## 2021-09-02 ENCOUNTER — Telehealth: Payer: Self-pay | Admitting: Gastroenterology

## 2021-09-02 NOTE — Telephone Encounter (Signed)
Records faxed to [facility] for referral to address the perianal nodule. ?resection per Dr Christella Hartigan.

## 2021-09-02 NOTE — Telephone Encounter (Signed)
  Follow up Call-     09/01/2021    2:44 PM 05/11/2021    9:03 AM  Call back number  Post procedure Call Back phone  # [phone]  Permission to leave phone message Yes Yes     Patient questions:  Do you have a fever, pain , or abdominal swelling? No. Pain Score  0 *  Have you tolerated food without any problems? Yes.    Have you been able to return to your normal activities? Yes.    Do you have any questions about your discharge instructions: Diet   No. Medications  No. Follow up visit  No.  Do you have questions or concerns about your Care? No.  Actions: * If pain score is 4 or above: No action needed, pain <4.

## 2021-09-02 NOTE — Telephone Encounter (Signed)
Inbound call from patient requesting to speak with a nurse due to a letter he received yesterday about contacting [facility]. Please give patient a call back to advise.  Thank you

## 2021-09-02 NOTE — Telephone Encounter (Signed)
Patient advised of the referral and records faxed to [facility] for the perianal nodule.

## 2021-09-06 ENCOUNTER — Telehealth: Payer: Self-pay | Admitting: Gastroenterology

## 2021-09-07 NOTE — Telephone Encounter (Signed)
Left message for patient to call back  

## 2021-09-13 NOTE — Telephone Encounter (Signed)
Inbound call from patient inquiring about results again. Please give patient a call back to advise.  Thank you

## 2021-09-15 ENCOUNTER — Other Ambulatory Visit: Payer: Self-pay

## 2021-09-15 MED ORDER — METHYLPREDNISOLONE 4 MG PO TBPK: ORAL_TABLET | ORAL | 0 refills | Status: DC

## 2021-09-15 NOTE — Telephone Encounter (Signed)
See 7/5 results note

## 2021-09-28 DIAGNOSIS — L290 Pruritus ani: Secondary | ICD-10-CM | POA: Diagnosis not present

## 2021-09-28 DIAGNOSIS — K629 Disease of anus and rectum, unspecified: Secondary | ICD-10-CM | POA: Diagnosis not present

## 2021-09-28 DIAGNOSIS — K641 Second degree hemorrhoids: Secondary | ICD-10-CM | POA: Diagnosis not present

## 2021-10-06 ENCOUNTER — Ambulatory Visit: Payer: Self-pay | Admitting: Surgery

## 2021-10-08 ENCOUNTER — Encounter (HOSPITAL_BASED_OUTPATIENT_CLINIC_OR_DEPARTMENT_OTHER): Payer: Self-pay | Admitting: Surgery

## 2021-10-08 NOTE — Progress Notes (Signed)
Spoke w/ via phone for pre-op interview--- pt Lab needs dos----  no             Lab results------ no COVID test -----patient states asymptomatic no test needed Arrive at ------- 0630 on 10-15-2021 NPO after MN NO Solid Food.  Clear liquids from MN until--- 0530 Med rec completed Medications to take morning of surgery ----- protonix Diabetic medication ----- n/a Patient instructed no nail polish to be worn day of surgery Patient instructed to bring photo id and insurance card day of surgery Patient aware to have Driver (ride ) / caregiver  for 24 hours after surgery -- wife, Bobby Mcbride Patient Special Instructions ----- will do one fleet enema morning of surgery per md order Pre-Op special Istructions ----- n/a Patient verbalized understanding of instructions that were given at this phone interview. Patient denies shortness of breath, chest pain, fever, cough at this phone interview.

## 2021-10-11 NOTE — Progress Notes (Signed)
Patient called to verify what time he was suppose to arrive on Friday, 10/15/21 for surgery. I told him to be at [facility] at 0630, and to stop clear liquids at 0530. He was concerned about not being able to do the enema due to pain and tenderness. I instructed him to do the best he could and let pre-op know if he was able to complete the enema.

## 2021-10-15 ENCOUNTER — Other Ambulatory Visit: Payer: Self-pay

## 2021-10-15 ENCOUNTER — Ambulatory Visit (HOSPITAL_BASED_OUTPATIENT_CLINIC_OR_DEPARTMENT_OTHER): Payer: BC Managed Care – PPO | Admitting: Anesthesiology

## 2021-10-15 ENCOUNTER — Ambulatory Visit (HOSPITAL_BASED_OUTPATIENT_CLINIC_OR_DEPARTMENT_OTHER)
Admission: RE | Admit: 2021-10-15 | Discharge: 2021-10-15 | Disposition: A | Discharge status: Home / Self Care | Payer: BC Managed Care – PPO | Attending: Surgery | Admitting: Surgery

## 2021-10-15 ENCOUNTER — Encounter (HOSPITAL_BASED_OUTPATIENT_CLINIC_OR_DEPARTMENT_OTHER)
Admission: RE | Disposition: A | Discharge status: Home / Self Care | Payer: Self-pay | Source: Home / Self Care | Attending: Surgery

## 2021-10-15 ENCOUNTER — Encounter (HOSPITAL_BASED_OUTPATIENT_CLINIC_OR_DEPARTMENT_OTHER): Payer: Self-pay | Admitting: Surgery

## 2021-10-15 DIAGNOSIS — Z01818 Encounter for other preprocedural examination: Secondary | ICD-10-CM

## 2021-10-15 DIAGNOSIS — L309 Dermatitis, unspecified: Secondary | ICD-10-CM | POA: Diagnosis not present

## 2021-10-15 DIAGNOSIS — K6289 Other specified diseases of anus and rectum: Secondary | ICD-10-CM | POA: Diagnosis not present

## 2021-10-15 DIAGNOSIS — K449 Diaphragmatic hernia without obstruction or gangrene: Secondary | ICD-10-CM | POA: Insufficient documentation

## 2021-10-15 DIAGNOSIS — L859 Epidermal thickening, unspecified: Secondary | ICD-10-CM | POA: Diagnosis not present

## 2021-10-15 DIAGNOSIS — K219 Gastro-esophageal reflux disease without esophagitis: Secondary | ICD-10-CM | POA: Insufficient documentation

## 2021-10-15 DIAGNOSIS — L308 Other specified dermatitis: Secondary | ICD-10-CM | POA: Insufficient documentation

## 2021-10-15 DIAGNOSIS — K629 Disease of anus and rectum, unspecified: Secondary | ICD-10-CM | POA: Diagnosis not present

## 2021-10-15 HISTORY — DX: Dysphasia: R47.02

## 2021-10-15 HISTORY — DX: Unspecified hemorrhoids: K64.9

## 2021-10-15 HISTORY — PX: SKIN BIOPSY: SHX1

## 2021-10-15 HISTORY — PX: MASS EXCISION: SHX2000

## 2021-10-15 HISTORY — DX: Diaphragmatic hernia without obstruction or gangrene: K44.9

## 2021-10-15 HISTORY — PX: RECTAL EXAM UNDER ANESTHESIA: SHX6399

## 2021-10-15 HISTORY — DX: Personal history of other diseases of the digestive system: Z87.19

## 2021-10-15 HISTORY — DX: Disease of anus and rectum, unspecified: K62.9

## 2021-10-15 SURGERY — EXCISION MASS
Anesthesia: General | Site: Rectum

## 2021-10-15 MED ORDER — LIDOCAINE HCL (PF) 2 % IJ SOLN
INTRAMUSCULAR | Status: AC
  Filled 2021-10-15: qty 5

## 2021-10-15 MED ORDER — DEXAMETHASONE SODIUM PHOSPHATE 10 MG/ML IJ SOLN
INTRAMUSCULAR | Status: DC | PRN
  Administered 2021-10-15: 10 mg via INTRAVENOUS

## 2021-10-15 MED ORDER — BUPIVACAINE LIPOSOME 1.3 % IJ SUSP
INTRAMUSCULAR | Status: DC | PRN
  Administered 2021-10-15: 20 mL

## 2021-10-15 MED ORDER — MIDAZOLAM HCL 2 MG/2ML IJ SOLN
INTRAMUSCULAR | Status: AC
  Filled 2021-10-15: qty 2

## 2021-10-15 MED ORDER — FENTANYL CITRATE (PF) 100 MCG/2ML IJ SOLN: 25.0000 ug | INTRAMUSCULAR | Status: DC | PRN

## 2021-10-15 MED ORDER — FLEET ENEMA 7-19 GM/118ML RE ENEM: 1.0000 | ENEMA | Freq: Once | RECTAL | Status: DC

## 2021-10-15 MED ORDER — ACETAMINOPHEN 500 MG PO TABS
1000.0000 mg | ORAL_TABLET | ORAL | Status: AC
  Administered 2021-10-15: 1000 mg via ORAL

## 2021-10-15 MED ORDER — PROPOFOL 10 MG/ML IV BOLUS
INTRAVENOUS | Status: AC
  Filled 2021-10-15: qty 20

## 2021-10-15 MED ORDER — OXYCODONE HCL 5 MG PO TABS: 5.0000 mg | ORAL_TABLET | Freq: Once | ORAL | Status: DC | PRN

## 2021-10-15 MED ORDER — LIDOCAINE 2% (20 MG/ML) 5 ML SYRINGE
INTRAMUSCULAR | Status: DC | PRN
  Administered 2021-10-15: 60 mg via INTRAVENOUS

## 2021-10-15 MED ORDER — ONDANSETRON HCL 4 MG/2ML IJ SOLN
INTRAMUSCULAR | Status: DC | PRN
  Administered 2021-10-15: 4 mg via INTRAVENOUS

## 2021-10-15 MED ORDER — LACTATED RINGERS IV SOLN: INTRAVENOUS | Status: DC

## 2021-10-15 MED ORDER — AMISULPRIDE (ANTIEMETIC) 5 MG/2ML IV SOLN: 10.0000 mg | Freq: Once | INTRAVENOUS | Status: DC | PRN

## 2021-10-15 MED ORDER — PROPOFOL 10 MG/ML IV BOLUS
INTRAVENOUS | Status: DC | PRN
  Administered 2021-10-15: 50 mg via INTRAVENOUS
  Administered 2021-10-15: 200 mg via INTRAVENOUS
  Administered 2021-10-15: 100 mg via INTRAVENOUS

## 2021-10-15 MED ORDER — FENTANYL CITRATE (PF) 100 MCG/2ML IJ SOLN
INTRAMUSCULAR | Status: DC | PRN
Start: 2021-10-15 — End: 2021-10-15
  Administered 2021-10-15 (×2): 50 ug via INTRAVENOUS

## 2021-10-15 MED ORDER — ACETAMINOPHEN 500 MG PO TABS
ORAL_TABLET | ORAL | Status: AC
  Filled 2021-10-15: qty 2

## 2021-10-15 MED ORDER — MIDAZOLAM HCL 5 MG/5ML IJ SOLN
INTRAMUSCULAR | Status: DC | PRN
  Administered 2021-10-15: 2 mg via INTRAVENOUS

## 2021-10-15 MED ORDER — CEFAZOLIN SODIUM-DEXTROSE 2-4 GM/100ML-% IV SOLN
2.0000 g | INTRAVENOUS | Status: AC
  Administered 2021-10-15: 2 g via INTRAVENOUS

## 2021-10-15 MED ORDER — OXYCODONE HCL 5 MG/5ML PO SOLN: 5.0000 mg | Freq: Once | ORAL | Status: DC | PRN

## 2021-10-15 MED ORDER — CEFAZOLIN SODIUM-DEXTROSE 2-4 GM/100ML-% IV SOLN
INTRAVENOUS | Status: AC
  Filled 2021-10-15: qty 100

## 2021-10-15 MED ORDER — TRAMADOL HCL 50 MG PO TABS: 50.0000 mg | ORAL_TABLET | Freq: Four times a day (QID) | ORAL | 0 refills | Status: AC | PRN

## 2021-10-15 MED ORDER — BUPIVACAINE-EPINEPHRINE 0.25% -1:200000 IJ SOLN
INTRAMUSCULAR | Status: DC | PRN
  Administered 2021-10-15: 20 mL

## 2021-10-15 MED ORDER — BUPIVACAINE LIPOSOME 1.3 % IJ SUSP: 20.0000 mL | Freq: Once | INTRAMUSCULAR | Status: DC

## 2021-10-15 MED ORDER — ONDANSETRON HCL 4 MG/2ML IJ SOLN
INTRAMUSCULAR | Status: AC
  Filled 2021-10-15: qty 2

## 2021-10-15 MED ORDER — FENTANYL CITRATE (PF) 100 MCG/2ML IJ SOLN
INTRAMUSCULAR | Status: AC
  Filled 2021-10-15: qty 2

## 2021-10-15 MED ORDER — DEXAMETHASONE SODIUM PHOSPHATE 10 MG/ML IJ SOLN
INTRAMUSCULAR | Status: AC
  Filled 2021-10-15: qty 1

## 2021-10-15 SURGICAL SUPPLY — 64 items
APL SKNCLS STERI-STRIP NONHPOA (GAUZE/BANDAGES/DRESSINGS) ×1
BENZOIN TINCTURE PRP APPL 2/3 (GAUZE/BANDAGES/DRESSINGS) ×2 IMPLANT
BLADE EXTENDED COATED 6.5IN (ELECTRODE) IMPLANT
BLADE SURG 10 STRL SS (BLADE) IMPLANT
BLADE SURG 15 STRL LF DISP TIS (BLADE) ×1 IMPLANT
BLADE SURG 15 STRL SS (BLADE) ×2
COVER BACK TABLE 60X90IN (DRAPES) ×2 IMPLANT
COVER MAYO STAND STRL (DRAPES) ×2 IMPLANT
DECANTER SPIKE VIAL GLASS SM (MISCELLANEOUS) ×2 IMPLANT
DRAPE HYSTEROSCOPY (MISCELLANEOUS) IMPLANT
DRAPE LAPAROTOMY 100X72 PEDS (DRAPES) ×2 IMPLANT
DRAPE SHEET LG 3/4 BI-LAMINATE (DRAPES) IMPLANT
DRAPE UTILITY XL STRL (DRAPES) ×2 IMPLANT
DRSG PAD ABDOMINAL 8X10 ST (GAUZE/BANDAGES/DRESSINGS) ×2 IMPLANT
GAUZE 4X4 16PLY ~~LOC~~+RFID DBL (SPONGE) ×2 IMPLANT
GAUZE SPONGE 4X4 12PLY STRL (GAUZE/BANDAGES/DRESSINGS) IMPLANT
GLOVE BIO SURGEON STRL SZ7.5 (GLOVE) ×2 IMPLANT
GLOVE BIOGEL PI IND STRL 8 (GLOVE) ×1 IMPLANT
GLOVE BIOGEL PI INDICATOR 8 (GLOVE) ×1
GOWN STRL REUS W/TWL LRG LVL3 (GOWN DISPOSABLE) ×2 IMPLANT
HYDROGEN PEROXIDE 16OZ (MISCELLANEOUS) IMPLANT
IV CATH 14GX2 1/4 (CATHETERS) IMPLANT
IV CATH 18G SAFETY (IV SOLUTION) IMPLANT
KIT SIGMOIDOSCOPE (SET/KITS/TRAYS/PACK) IMPLANT
KIT TURNOVER CYSTO (KITS) ×2 IMPLANT
LEGGING LITHOTOMY PAIR STRL (DRAPES) IMPLANT
LOOP VESSEL MAXI BLUE (MISCELLANEOUS) IMPLANT
NDL HYPO 25X1 1.5 SAFETY (NEEDLE) IMPLANT
NDL SAFETY ECLIPSE 18X1.5 (NEEDLE) IMPLANT
NEEDLE HYPO 18GX1.5 SHARP (NEEDLE)
NEEDLE HYPO 22GX1.5 SAFETY (NEEDLE) ×2 IMPLANT
NEEDLE HYPO 25X1 1.5 SAFETY (NEEDLE) IMPLANT
NS IRRIG 500ML POUR BTL (IV SOLUTION) ×2 IMPLANT
PACK BASIN DAY SURGERY FS (CUSTOM PROCEDURE TRAY) ×2 IMPLANT
PANTS MESH DISP LRG (UNDERPADS AND DIAPERS) ×2 IMPLANT
PENCIL SMOKE EVACUATOR (MISCELLANEOUS) ×2 IMPLANT
SOL PREP POV-IOD 4OZ 10% (MISCELLANEOUS) ×1 IMPLANT
SPONGE HEMORRHOID 8X3CM (HEMOSTASIS) IMPLANT
SPONGE SURGIFOAM ABS GEL 12-7 (HEMOSTASIS) IMPLANT
SURGILUBE 2OZ TUBE FLIPTOP (MISCELLANEOUS) ×1 IMPLANT
SUT CHROMIC 2 0 SH (SUTURE) IMPLANT
SUT CHROMIC 3 0 PS 2 (SUTURE) ×2 IMPLANT
SUT CHROMIC 3 0 SH 27 (SUTURE) IMPLANT
SUT MNCRL AB 4-0 PS2 18 (SUTURE) IMPLANT
SUT SILK 0 TIES 10X30 (SUTURE) ×2 IMPLANT
SUT SILK 2 0 (SUTURE)
SUT SILK 2 0 SH (SUTURE) ×2 IMPLANT
SUT SILK 2-0 18XBRD TIE 12 (SUTURE) IMPLANT
SUT VIC AB 2-0 SH 27 (SUTURE)
SUT VIC AB 2-0 SH 27XBRD (SUTURE) IMPLANT
SUT VIC AB 3-0 SH 18 (SUTURE) IMPLANT
SUT VIC AB 3-0 SH 27 (SUTURE)
SUT VIC AB 3-0 SH 27X BRD (SUTURE) IMPLANT
SUT VIC AB 3-0 SH 27XBRD (SUTURE) IMPLANT
SUT VIC AB 4-0 P-3 18XBRD (SUTURE) IMPLANT
SUT VIC AB 4-0 P3 18 (SUTURE)
SYR 20ML LL LF (SYRINGE) IMPLANT
SYR BULB IRRIG 60ML STRL (SYRINGE) ×2 IMPLANT
SYR CONTROL 10ML LL (SYRINGE) ×2 IMPLANT
SYR TB 1ML LL NO SAFETY (SYRINGE) IMPLANT
TOWEL OR 17X26 10 PK STRL BLUE (TOWEL DISPOSABLE) ×2 IMPLANT
TRAY DSU PREP LF (CUSTOM PROCEDURE TRAY) ×2 IMPLANT
TUBE CONNECTING 12X1/4 (SUCTIONS) ×2 IMPLANT
YANKAUER SUCT BULB TIP NO VENT (SUCTIONS) ×2 IMPLANT

## 2021-10-15 NOTE — Anesthesia Procedure Notes (Signed)
Procedure Name: LMA Insertion Date/Time: 10/15/2021 9:01 AM  Performed by: Bishop Limbo, CRNAPre-anesthesia Checklist: Patient identified, Emergency Drugs available, Suction available and Patient being monitored Patient Re-evaluated:Patient Re-evaluated prior to induction Oxygen Delivery Method: Circle System Utilized Preoxygenation: Pre-oxygenation with 100% oxygen Induction Type: IV induction Ventilation: Mask ventilation without difficulty LMA: LMA inserted LMA Size: 5.0 Number of attempts: 1 Placement Confirmation: positive ETCO2 Tube secured with: Tape Dental Injury: Teeth and Oropharynx as per pre-operative assessment

## 2021-10-15 NOTE — Transfer of Care (Signed)
Immediate Anesthesia Transfer of Care Note  Patient: Bobby Mcbride  Procedure(s) Performed: EXCISION OF PERIANAL MASS (Rectum) BIOPSY OF GLUTEAL SKIN (Rectum) ANORECTAL EXAM UNDER ANESTHESIA (Rectum)  Patient Location: PACU  Anesthesia Type:General  Level of Consciousness: drowsy, patient cooperative and responds to stimulation  Airway & Oxygen Therapy: Patient Spontanous Breathing  Post-op Assessment: Report given to RN and Post -op Vital signs reviewed and stable  Post vital signs: Reviewed and stable  Last Vitals:  Vitals Value Taken Time  BP 124/69 10/15/21 0945  Temp    Pulse 69 10/15/21 0948  Resp 17 10/15/21 0948  SpO2 92 % 10/15/21 0948  Vitals shown include unvalidated device data.  Last Pain:  Vitals:   10/15/21 0715  TempSrc: Oral      Patients Stated Pain Goal: 5 (10/15/21 0715)  Complications: No notable events documented.

## 2021-10-15 NOTE — H&P (Signed)
CC: Here today for surgery  HPI: Bobby Mcbride is an 51 y.o. male with history of allergies, GERD, whom is seen in the office today as a referral by Dr. Christella Hartigan for evaluation of perianal nodule.  He underwent upper and lower endoscopy 09/01/2021 with Dr. Christella Hartigan for constipation, 10 pound weight loss over the last 3 to 4 months, intermittent mild rectal bleeding.  Colonoscopy 08/31/2021 demonstrated a 1 cm rubbery perianal nodule of uncertain etiology. Colon was otherwise normal with exception of some small internal hemorrhoids.  Denies any history of anal pain. He does have some occasional rectal bleeding when he has bowel movements. He does not take a fiber supplement. He has 1-2 bowel movements per day that are variable in consistency, sometimes hard. Drinks 0 ounces of water per day, primarily Gatorade.  PMH: Allergies, GERD  PSH: Denies any prior anorectal surgeries or procedures  FHx: Denies any known family history of colorectal, breast, endometrial or ovarian cancer  Social Hx: Denies use of tobacco/EtOH/illicit drug. Works as a IT sales professional. 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   hospital admission lower GI bleed,  per EGD small non-bleeding mallory-weiss tear (no intervention) excessive nsaid use   Migraine with aura    Mitral valve prolapse    per pt dx age 66 had work-up done including echo many yrs ago  (10-07-2021  pt stated pcp at times hears murmur but a symptomatic)   Perianal lesion     Past Surgical History:  Procedure Laterality Date   APPENDECTOMY  1990   BIOPSY  09/03/2018    Procedure: BIOPSY;  Surgeon: Lynann Bologna, MD;  Location: [facility];  Service: Endoscopy;;   COLONOSCOPY WITH ESOPHAGOGASTRODUODENOSCOPY (EGD)  08/31/2021   by dr Christella Hartigan   ELBOW SURGERY Right 1987   ESOPHAGOGASTRODUODENOSCOPY  08/18/2011   Procedure: ESOPHAGOGASTRODUODENOSCOPY (EGD);  Surgeon: Beverley Fiedler, MD;  Location: [facility];  Service: Gastroenterology;  Laterality: N/A;   ESOPHAGOGASTRODUODENOSCOPY  11/11/2011   Procedure: ESOPHAGOGASTRODUODENOSCOPY (EGD);  Surgeon: Iva Boop, MD;  Location: [facility];  Service: Endoscopy;  Laterality: N/A;   ESOPHAGOGASTRODUODENOSCOPY (EGD) WITH PROPOFOL N/A 09/03/2018   Procedure: ESOPHAGOGASTRODUODENOSCOPY (EGD) WITH PROPOFOL;  Surgeon: Lynann Bologna, MD;  Location: [facility];  Service: Endoscopy;  Laterality: N/A;   KNEE ARTHROSCOPY Right 02/26/2009   @[facility]  by dr c. blackman   MULTIPLE TOOTH EXTRACTIONS     several ---  w/ IV sedation  last extraction 05/ 2023   SHOULDER ARTHROSCOPY W/ ROTATOR CUFF REPAIR Right 2012    Family History  Problem Relation Age of Onset   Hypertension Mother    Stroke Mother    Migraines Mother    Diabetes Mother    Prostate cancer Father    Hypertension Father    Stroke Father    Heart failure Father    Asthma Father    Colon polyps Father    Colon cancer Neg Hx    Esophageal cancer Neg Hx    Stomach cancer Neg Hx    Rectal  cancer Neg Hx     Social:  reports that he has never smoked. He has never used smokeless tobacco. He reports that he does not drink alcohol and does not use drugs.  Allergies:  Allergies  Allergen Reactions   Codeine Other (See Comments)    Hallucinations   Hydrocodone-Acetaminophen Other (See Comments)    Hallucinations   Food     Pepperoni-nausea/vomiting Lettuce-nausea/vomiting Oranges-nausea/vomiting Cheese-nausea/vomiting   Ibuprofen     "ulcers in my stomach"   Compazine [Prochlorperazine Edisylate] Anxiety    Medications: I have reviewed the  patient's current medications.  No results found for this or any previous visit (from the past 48 hour(s)).  No results found.  ROS - all of the below systems have been reviewed with the patient and positives are indicated with bold text General: chills, fever or night sweats Eyes: blurry vision or double vision ENT: epistaxis or sore throat Allergy/Immunology: itchy/watery eyes or nasal congestion Hematologic/Lymphatic: bleeding problems, blood clots or swollen lymph nodes Endocrine: temperature intolerance or unexpected weight changes Breast: new or changing breast lumps or nipple discharge Resp: cough, shortness of breath, or wheezing CV: chest pain or dyspnea on exertion GI: as per HPI GU: dysuria, trouble voiding, or hematuria MSK: joint pain or joint stiffness Neuro: TIA or stroke symptoms Derm: pruritus and skin lesion changes Psych: anxiety and depression  PE Blood pressure (!) 152/87, pulse (!) 58, temperature (!) 97.4 F (36.3 C), temperature source Oral, resp. rate 16, height 5\' 7"  (1.702 m), weight 78.7 kg, SpO2 98 %. Constitutional: NAD; conversant Eyes: Moist conjunctiva Lungs: Normal respiratory effort CV: RRR MSK: Normal range of motion of extremities Psychiatric: Appropriate affect; alert and oriented x3   No results found for this or any previous visit (from the past 48 hour(s)).  No results found.    A/P: Bobby Mcbride is an 51 y.o. male with hx of GERD, allergies here for evaluation of perianal nodule  -Providing sample for topical calmoseptine to be applied externally, 2-3 times daily -Discussed pruritus ani and the itch scratch cycle. Discussed avoiding itching at all costs. We discussed taking a daily fiber supplement such as Metamucil or Benefiber in powder form, dissolved in water. We discussed continue this indefinitely -We discussed drinking 64 ounces of water per day and minimizing commode time to 2 to 3 minutes -As for the redness around  the anal area, I suspect this is pruritus ani but he also has 2 associated nodules. We discussed excision of these under anesthesia. We would also plan biopsy of the perianal skin at that time to rule out things like Paget's disease.  -The anatomy and physiology of the anal canal was discussed with the patient. The pathophysiology of anal nodules was discussed as well -We have reviewed options going forward including further observation vs surgery -surgery providing pathologic confirmation of what exactly these represent. We discussed excision of perianal lesions. We discussed biopsy of perianal skin. -The planned procedure, material risks (including, but not limited to, pain, bleeding, infection, scarring, need for additional procedures, recurrence, pneumonia, heart attack, stroke, death) benefits and alternatives to surgery were discussed at length. The patient's questions were answered to his satisfaction, he voiced understanding and elected to proceed with surgery. Additionally, we discussed typical postoperative expectations and the recovery process.   43, MD [facility], [facility]

## 2021-10-15 NOTE — Op Note (Signed)
10/15/2021  9:43 AM  PATIENT:  Bobby Mcbride  51 y.o. male  Patient Care Team: Eden Emms, NP as PCP - General (Nurse Practitioner)  PRE-OPERATIVE DIAGNOSIS:  Perianal lesions, abnormal anal margin skin vs pruritus ani  POST-OPERATIVE DIAGNOSIS:  Same  PROCEDURE:   Excision of perianal skin lesions x 2 - right posterior, left posterior Punch biopsy of anal margin skin, 4 mm  Anorectal exam under anesthesia  SURGEON: Marin Olp, MD  ASSISTANT: Sherald Barge, MD PGY-5   ANESTHESIA:   local and general  SPECIMEN:   Right posterior perianal skin lesion Left posterior perianal skin lesion Anorectal exam under anesthesia  DISPOSITION OF SPECIMEN:  PATHOLOGY  COUNTS:  Sponge, needle, and instrument counts were reported correct x2 at conclusion.  EBL: 5 mL  PLAN OF CARE: Discharge to home after PACU  PATIENT DISPOSITION:  PACU - hemodynamically stable.  OR FINDINGS: 2 rubbery/firm nodules in the perianal region-right posterior and left posterior.  DESCRIPTION: The patient was identified in the preoperative holding area and taken to the OR where he was placed on the operating room table. SCDs were placed.  General anesthesia was induced without difficulty. The patient was then positioned in high lithotomy with Allen stirrups. Pressure points were then evaluated and padded.  He was then prepped and draped in usual sterile fashion.  A surgical timeout was performed indicating the correct patient, procedure, and positioning.  A perianal block was performed using a dilute mixture of 0.25% Marcaine with epinephrine and Exparel.   After ascertaining that an appropriate level of anesthesia had been achieved, a well lubricated digital rectal exam was performed. This demonstrated no palpable masses.  A small Hill-Ferguson anoscope was inserted into the anal canal and circumferential inspection demonstrated healthy appearing anoderm with some superficial fissuring but no evident  pathology within the anal canal.  Externally, there are 2 small rubbery firm perianal nodules.  There is also thickening of the perianal skin with erythematous waxy appearing skin within the anal margin.  This is clearly demarcated with regards to the borders.  We began with the 2 posterior anal nodules-right posterolateral being largest - this was excised sharply and passed off the specimen.  Hemostasis achieved with electrocautery.  The defect is then approximated using a 3-0 chromic stitch.  We then approached the left posterior lateral nodule.  This is also elevated with a forcep and excised sharply.  Hemostasis achieved electrocautery.  This is passed off the specimen.  This defect is also approximated with a single 3-0 chromic stitch.  We then directed our attention and obtaining a punch biopsy of the anal margin skin that is somewhat thickened and erythematous in appearance.  Including a rim of normal skin, a 4 mm punch biopsy is obtained.  This is passed off the specimen.  Hemostasis achieved electrocautery.  This is also closed with a single 3-0 chromic stitch.  Additional local anesthetic is infiltrated into the perianal tissues.  All counts are reported correct.  He is taken out of lithotomy, a dressing consisting of 4 x 4's/ABD/mesh underwear is placed. He is awakened from anesthesia and transferred to a stretcher for transport to PACU in satisfactory condition.  DISPOSITION: PACU in satisfactory condition.

## 2021-10-15 NOTE — Anesthesia Preprocedure Evaluation (Addendum)
Anesthesia Evaluation  Patient identified by MRN, date of birth, ID band Patient awake    Reviewed: Allergy & Precautions, NPO status , Patient's Chart, lab work & pertinent test results  Airway Mallampati: I  TM Distance: >3 FB Neck ROM: Full    Dental  (+) Poor Dentition, Missing, Chipped   Pulmonary neg pulmonary ROS,    Pulmonary exam normal        Cardiovascular negative cardio ROS Normal cardiovascular exam     Neuro/Psych  Headaches, Anxiety  Neuromuscular disease    GI/Hepatic Neg liver ROS, hiatal hernia, GERD  Medicated and Controlled,  Endo/Other  negative endocrine ROS  Renal/GU Renal disease     Musculoskeletal negative musculoskeletal ROS (+)   Abdominal   Peds  Hematology negative hematology ROS (+)   Anesthesia Other Findings ANAL LESION, PRURITIS ANI, GRADE 1 HEMORRHOIDS  Reproductive/Obstetrics                            Anesthesia Physical Anesthesia Plan  ASA: 2  Anesthesia Plan: General   Post-op Pain Management:    Induction: Intravenous  PONV Risk Score and Plan: 2 and Ondansetron, Dexamethasone, Midazolam and Treatment may vary due to age or medical condition  Airway Management Planned: LMA  Additional Equipment:   Intra-op Plan:   Post-operative Plan: Extubation in OR  Informed Consent: I have reviewed the patients History and Physical, chart, labs and discussed the procedure including the risks, benefits and alternatives for the proposed anesthesia with the patient or authorized representative who has indicated his/her understanding and acceptance.     Dental advisory given  Plan Discussed with: CRNA  Anesthesia Plan Comments:         Anesthesia Quick Evaluation

## 2021-10-15 NOTE — Discharge Instructions (Addendum)
ANORECTAL SURGERY: POST OP INSTRUCTIONS  DIET: Follow a light bland diet the first 24 hours after arrival home, such as soup, liquids, crackers, etc.  Be sure to include lots of fluids daily.  Avoid fast food or heavy meals as your are more likely to get nauseated.  Eat a low fat diet the next few days after surgery.   Some bleeding with bowel movements is expected for the first couple of days but this should stop in between bowel movements  Take your usually prescribed home medications unless otherwise directed. No foreign bodies per rectum for the next 3 months (enemas, etc)  PAIN CONTROL: It is helpful to take an over-the-counter pain medication regularly for the first few days/weeks.  Choose from the following that works best for you: Ibuprofen (Advil, etc) Three 200mg tabs every 6 hours as needed. Acetaminophen (Tylenol, etc) 500-650mg every 6 hours as needed NOTE: You may take both of these medications together - most patients find it most helpful when alternating between the two (i.e. Ibuprofen at 6am, tylenol at 9am, ibuprofen at 12pm ...) A  prescription for pain medication may have been prescribed for you at discharge.  Take your pain medication as prescribed.  If you are having problems/concerns with the prescription medicine, please call us for further advice.  Avoid getting constipated.  Between the surgery and the pain medications, it is common to experience some constipation.  Increasing fluid intake (64oz of water per day) and taking a fiber supplement (such as Metamucil, Citrucel, FiberCon) 1-2 times a day regularly will usually help prevent this problem from occurring.  Take Miralax (over the counter) 1-2x/day while taking a narcotic pain medication. If no bowel movement after 48hours, you may additionally take a laxative like a bottle of Milk of Magnesia which can be purchased over the counter. Avoid enemas.   Watch out for diarrhea.  If you have many loose bowel movements,  simplify your diet to bland foods.  Stop any stool softeners and decrease your fiber supplement. If this worsens or does not improve, please call us.  Wash / shower every day.  If you were discharged with a dressing, you may remove this the day after your surgery. You may shower normally, getting soap/water on your wound, particularly after bowel movements.  Soaking in a warm bath filled a couple inches ("Sitz bath") is a great way to clean the area after a bowel movement and many patients find it is a way to soothe the area.  ACTIVITIES as tolerated:   You may resume regular (light) daily activities beginning the next day--such as daily self-care, walking, climbing stairs--gradually increasing activities as tolerated.  If you can walk 30 minutes without difficulty, it is safe to try more intense activity such as jogging, treadmill, bicycling, low-impact aerobics, etc. Refrain from any heavy lifting or straining for the first 2 weeks after your procedure, particularly if your surgery was for hemorrhoids. Avoid activities that make your pain worse You may drive when you are no longer taking prescription pain medication, you can comfortably wear a seatbelt, and you can safely maneuver your car and apply brakes.  FOLLOW UP in our office Please call [facility] at [phone] to set up an appointment to see your surgeon in the office for a follow-up appointment approximately 2 weeks after your surgery. Make sure that you call for this appointment the day you arrive home to insure a convenient appointment time.  9. If you have disability or family leave forms   that need to be completed, you may have them completed by your primary care physician's office; for return to work instructions, please ask our office staff and they will be happy to assist you in obtaining this documentation   When to call us [phone]: Poor pain control Reactions / problems with new medications (rash/itching, etc)  Fever over  101.5 F (38.5 C) Inability to urinate Nausea/vomiting Worsening swelling or bruising Continued bleeding from incision. Increased pain, redness, or drainage from the incision  The clinic staff is available to answer your questions during regular business hours (8:30am-5pm).  Please don't hesitate to call and ask to speak to one of our nurses for clinical concerns.   A surgeon from [facility] is always on call at the hospitals   If you have a medical emergency, go to the nearest emergency room or call 911.   [facility] [address], [address], [place], [place]  [address] MAIN: [phone] FAX: [phone] [email or web address]      NO TYLENOL PRODUCTS UNTIL 1:20 PM TODAY.     Post Anesthesia Home Care Instructions  Activity: Get plenty of rest for the remainder of the day. A responsible individual must stay with you for 24 hours following the procedure.  For the next 24 hours, DO NOT: -Drive a car -Advertising copywriter -Drink alcoholic beverages -Take any medication unless instructed by your physician -Make any legal decisions or sign important papers.  Meals: Start with liquid foods such as gelatin or soup. Progress to regular foods as tolerated. Avoid greasy, spicy, heavy foods. If nausea and/or vomiting occur, drink only clear liquids until the nausea and/or vomiting subsides. Call your physician if vomiting continues.  Special Instructions/Symptoms: Your throat may feel dry or sore from the anesthesia or the breathing tube placed in your throat during surgery. If this causes discomfort, gargle with warm salt water. The discomfort should disappear within 24 hours.

## 2021-10-15 NOTE — Anesthesia Postprocedure Evaluation (Signed)
Anesthesia Post Note  Patient: Bobby Mcbride  Procedure(s) Performed: EXCISION OF PERIANAL MASS (Rectum) BIOPSY OF GLUTEAL SKIN (Rectum) ANORECTAL EXAM UNDER ANESTHESIA (Rectum)     Patient location during evaluation: PACU Anesthesia Type: General Level of consciousness: awake Pain management: pain level controlled Vital Signs Assessment: post-procedure vital signs reviewed and stable Respiratory status: spontaneous breathing, nonlabored ventilation, respiratory function stable and patient connected to nasal cannula oxygen Cardiovascular status: blood pressure returned to baseline and stable Postop Assessment: no apparent nausea or vomiting Anesthetic complications: no   No notable events documented.  Last Vitals:  Vitals:   10/15/21 1100 10/15/21 1135  BP: 129/83 135/86  Pulse: (!) 58 63  Resp: 16 15  Temp:  36.6 C  SpO2: 94% 97%    Last Pain:  Vitals:   10/15/21 1135  TempSrc:   PainSc: 0-No pain                 Lonn Im P Cereniti Curb

## 2021-10-18 ENCOUNTER — Encounter (HOSPITAL_BASED_OUTPATIENT_CLINIC_OR_DEPARTMENT_OTHER): Payer: Self-pay | Admitting: Surgery

## 2021-10-21 LAB — SURGICAL PATHOLOGY

## 2021-10-25 DIAGNOSIS — L290 Pruritus ani: Secondary | ICD-10-CM | POA: Insufficient documentation

## 2021-11-19 ENCOUNTER — Other Ambulatory Visit: Payer: Self-pay

## 2021-11-22 ENCOUNTER — Telehealth: Payer: Self-pay | Admitting: Gastroenterology

## 2021-11-22 NOTE — Telephone Encounter (Signed)
Inbound call from patient stating he has been having complications swallowing his food. Advised patient of next available fu for November but patient states he needs to be soon. Patient would like a call back to discuss symptoms he has been having. Please give a call to further advise.

## 2021-11-23 NOTE — Telephone Encounter (Signed)
I spoke with the pt and he tells me that he has begun to have trouble swallowing foods again. He was last seen for EGD on 6/21, also on 05/11/21 with Dysphagia, chronic, intermittent. 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 He says he is taking protonix 40 mg once daily. He has been scheduled for appt with Marchelle Folks on 9/18.  In the meantime he will take small bites, chew his food well, and avoid meats and tough foods.

## 2021-11-24 ENCOUNTER — Other Ambulatory Visit: Payer: Self-pay

## 2021-11-24 ENCOUNTER — Encounter (HOSPITAL_COMMUNITY)
Admission: EM | Disposition: A | Discharge status: Home / Self Care | Payer: Self-pay | Source: Home / Self Care | Attending: Emergency Medicine

## 2021-11-24 ENCOUNTER — Ambulatory Visit (HOSPITAL_COMMUNITY)
Admission: EM | Admit: 2021-11-24 | Discharge: 2021-11-24 | Disposition: A | Discharge status: Home / Self Care | Payer: BC Managed Care – PPO | Attending: Emergency Medicine | Admitting: Emergency Medicine

## 2021-11-24 ENCOUNTER — Ambulatory Visit: Payer: BC Managed Care – PPO | Admitting: Nurse Practitioner

## 2021-11-24 ENCOUNTER — Emergency Department (HOSPITAL_COMMUNITY): Payer: BC Managed Care – PPO | Admitting: Registered Nurse

## 2021-11-24 ENCOUNTER — Telehealth: Payer: Self-pay

## 2021-11-24 ENCOUNTER — Encounter (HOSPITAL_COMMUNITY): Payer: Self-pay | Admitting: Pharmacy Technician

## 2021-11-24 DIAGNOSIS — K449 Diaphragmatic hernia without obstruction or gangrene: Secondary | ICD-10-CM | POA: Diagnosis not present

## 2021-11-24 DIAGNOSIS — K222 Esophageal obstruction: Secondary | ICD-10-CM

## 2021-11-24 DIAGNOSIS — W44F3XA Food entering into or through a natural orifice, initial encounter: Secondary | ICD-10-CM

## 2021-11-24 DIAGNOSIS — G709 Myoneural disorder, unspecified: Secondary | ICD-10-CM | POA: Diagnosis not present

## 2021-11-24 DIAGNOSIS — T18128A Food in esophagus causing other injury, initial encounter: Secondary | ICD-10-CM

## 2021-11-24 DIAGNOSIS — X58XXXA Exposure to other specified factors, initial encounter: Secondary | ICD-10-CM | POA: Diagnosis not present

## 2021-11-24 DIAGNOSIS — T18108A Unspecified foreign body in esophagus causing other injury, initial encounter: Secondary | ICD-10-CM | POA: Diagnosis not present

## 2021-11-24 DIAGNOSIS — K2100 Gastro-esophageal reflux disease with esophagitis, without bleeding: Secondary | ICD-10-CM | POA: Diagnosis not present

## 2021-11-24 HISTORY — PX: ESOPHAGOGASTRODUODENOSCOPY (EGD) WITH PROPOFOL: SHX5813

## 2021-11-24 SURGERY — ESOPHAGOGASTRODUODENOSCOPY (EGD) WITH PROPOFOL
Anesthesia: General

## 2021-11-24 MED ORDER — DEXAMETHASONE SODIUM PHOSPHATE 10 MG/ML IJ SOLN
INTRAMUSCULAR | Status: DC | PRN
  Administered 2021-11-24: 8 mg via INTRAVENOUS

## 2021-11-24 MED ORDER — PANTOPRAZOLE SODIUM 40 MG PO TBEC: 40.0000 mg | DELAYED_RELEASE_TABLET | Freq: Every day | ORAL | 11 refills | Status: DC

## 2021-11-24 MED ORDER — FENTANYL CITRATE (PF) 100 MCG/2ML IJ SOLN
INTRAMUSCULAR | Status: DC | PRN
  Administered 2021-11-24 (×2): 50 ug via INTRAVENOUS

## 2021-11-24 MED ORDER — SUGAMMADEX SODIUM 200 MG/2ML IV SOLN
INTRAVENOUS | Status: DC | PRN
  Administered 2021-11-24: 200 mg via INTRAVENOUS

## 2021-11-24 MED ORDER — FENTANYL CITRATE (PF) 100 MCG/2ML IJ SOLN
INTRAMUSCULAR | Status: AC
  Filled 2021-11-24: qty 2

## 2021-11-24 MED ORDER — PROPOFOL 10 MG/ML IV BOLUS
INTRAVENOUS | Status: DC | PRN
  Administered 2021-11-24: 180 mg via INTRAVENOUS

## 2021-11-24 MED ORDER — ROCURONIUM BROMIDE 10 MG/ML (PF) SYRINGE
PREFILLED_SYRINGE | INTRAVENOUS | Status: DC | PRN
  Administered 2021-11-24: 30 mg via INTRAVENOUS

## 2021-11-24 MED ORDER — GLUCAGON HCL RDNA (DIAGNOSTIC) 1 MG IJ SOLR
1.0000 mg | Freq: Once | INTRAMUSCULAR | Status: AC
  Administered 2021-11-24: 1 mg via INTRAVENOUS
  Filled 2021-11-24: qty 1

## 2021-11-24 MED ORDER — SODIUM CHLORIDE 0.9 % IV SOLN: INTRAVENOUS | Status: DC

## 2021-11-24 MED ORDER — PROPOFOL 10 MG/ML IV BOLUS
INTRAVENOUS | Status: AC
  Filled 2021-11-24: qty 20

## 2021-11-24 MED ORDER — LIDOCAINE 2% (20 MG/ML) 5 ML SYRINGE
INTRAMUSCULAR | Status: DC | PRN
  Administered 2021-11-24: 80 mg via INTRAVENOUS

## 2021-11-24 MED ORDER — SUCCINYLCHOLINE CHLORIDE 200 MG/10ML IV SOSY
PREFILLED_SYRINGE | INTRAVENOUS | Status: DC | PRN
  Administered 2021-11-24: 100 mg via INTRAVENOUS

## 2021-11-24 MED ORDER — ONDANSETRON HCL 4 MG/2ML IJ SOLN
INTRAMUSCULAR | Status: DC | PRN
  Administered 2021-11-24: 4 mg via INTRAVENOUS

## 2021-11-24 MED ORDER — LACTATED RINGERS IV SOLN: INTRAVENOUS | Status: DC | PRN

## 2021-11-24 SURGICAL SUPPLY — 15 items

## 2021-11-24 NOTE — ED Provider Triage Note (Signed)
Emergency Medicine Provider Triage Evaluation Note  Bobby Mcbride , a 51 y.o. male  was evaluated in triage.  Pt complains of impacted food bolus, sees [facility], frequent endoscopy for same. Last endoscopy was Feb, told he would need to be dilated q 6 months. Roast beef or bread.  Review of Systems  Positive: As above Negative: As above  Physical Exam  There were no vitals taken for this visit. Gen:   Awake, no distress   Resp:  Normal effort  MSK:   Moves extremities without difficulty  Other:    Medical Decision Making  Medically screening exam initiated at 2:06 PM.  Appropriate orders placed.  Bobby Mcbride was informed that the remainder of the evaluation will be completed by another provider, this initial triage assessment does not replace that evaluation, and the importance of remaining in the ED until their evaluation is complete.     Jeannie Fend, PA-C 11/24/21 1406

## 2021-11-24 NOTE — Op Note (Signed)
[facility] Patient Name: Bobby Mcbride Procedure Date: 11/24/2021 MRN: [id number] Attending MD: Docia Chuck. Henrene Pastor , MD Date of Birth: October 12, 1970 CSN: [id number] Age: 51 Admit Type: Emergency Department Procedure:                Upper GI endoscopy with relief of food impaction,                            balloon dilation esophagus Indications:              Foreign body in the esophagus Providers:                Docia Chuck. Henrene Pastor, MD, [facility] Technician,                            Technician, Brien Mates, RNFA, Dulcy Fanny Referring MD:             ER Medicines:                Monitored Anesthesia Care, General Anesthesia Complications:            No immediate complications. Estimated Blood Loss:     Estimated blood loss: none. Procedure:                Pre-Anesthesia Assessment:                           - Prior to the procedure, a History and Physical                            was performed, and patient medications and                            allergies were reviewed. The patient's tolerance of                            previous anesthesia was also reviewed. The risks                            and benefits of the procedure and the sedation                            options and risks were discussed with the patient.                            All questions were answered, and informed consent                            was obtained. Prior Anticoagulants: The patient has                            taken no previous anticoagulant or antiplatelet                            agents. ASA Grade Assessment: II - A patient with  mild systemic disease. After reviewing the risks                            and benefits, the patient was deemed in                            satisfactory condition to undergo the procedure.                           After obtaining informed consent, the endoscope was                            passed under direct  vision. Throughout the                            procedure, the patient's blood pressure, pulse, and                            oxygen saturations were monitored continuously. The                            [id number]-[id number] ([id number]) Olympus endoscope was introduced                            through the mouth, and advanced to the second part                            of duodenum. The upper GI endoscopy was                            accomplished without difficulty. The patient                            tolerated the procedure well. Scope In: Scope Out: Findings:      The esophagus revealed a stricture at the gastroesophageal junction as       well as edema and mild esophagitis. The acute food bolus had just passed       into the stomach.. After completing the endoscopic survey, A TTS dilator       was passed through the scope. Dilation with an 18-19-20 mm balloon       dilator was performed to 20 mm. Stricture was disrupted.      The stomach revealed small hiatal hernia, and food bolus. Stomach was       otherwise normal      The examined duodenum revealed superficial erythema of the bulb but was       otherwise normal.      The cardia and gastric fundus were normal on retroflexion. Impression:               1. Esophageal food impaction status post endoscopic                            relief  2. GERD with esophagitis and esophageal stricture                            status post balloon dilation of esophagus to 20 mm                           3. Small hiatal hernia. Otherwise normal EGD. Moderate Sedation:      none Recommendation:           1. Patient has a contact number available for                            emergencies. The signs and symptoms of potential                            delayed complications were discussed with the                            patient. Return to normal activities tomorrow.                            Written discharge instructions  were provided to the                            patient.                           2. Clear liquids until morning. Resume previous                            diet thereafter.                           3. Continue present medications, including                            pantoprazole 40 mg daily.                           4. Office follow-up with Dr. Marina Goodell in 3 months Procedure Code(s):        --- Professional ---                           [phone], Esophagogastroduodenoscopy, flexible,                            transoral; with transendoscopic balloon dilation of                            esophagus (less than 30 mm diameter) Diagnosis Code(s):        --- Professional ---                           T18.108A, Unspecified foreign body in esophagus  causing other injury, initial encounter CPT copyright 2019 American Medical Association. All rights reserved. The codes documented in this report are preliminary and upon coder review may  be revised to meet current compliance requirements. Docia Chuck. Henrene Pastor, MD 11/24/2021 3:41:18 PM This report has been signed electronically. Number of Addenda: 0

## 2021-11-24 NOTE — Anesthesia Procedure Notes (Signed)
Procedure Name: Intubation Date/Time: 11/24/2021 3:15 PM  Performed by: Victoriano Lain, CRNAPre-anesthesia Checklist: Patient identified, Emergency Drugs available, Suction available, Patient being monitored and Timeout performed Patient Re-evaluated:Patient Re-evaluated prior to induction Oxygen Delivery Method: Circle system utilized Preoxygenation: Pre-oxygenation with 100% oxygen Induction Type: IV induction, Rapid sequence and Cricoid Pressure applied Laryngoscope Size: Mac and 4 Grade View: Grade I Tube type: Oral Tube size: 7.5 mm Number of attempts: 1 Airway Equipment and Method: Stylet Placement Confirmation: ETT inserted through vocal cords under direct vision, positive ETCO2 and breath sounds checked- equal and bilateral Secured at: 22 cm Tube secured with: Tape Dental Injury: Teeth and Oropharynx as per pre-operative assessment

## 2021-11-24 NOTE — ED Triage Notes (Signed)
Pt here with impacted food bolus. Hx of same. Pt able to talk and manage secretions  at this time.

## 2021-11-24 NOTE — Discharge Instructions (Signed)
YOU HAD AN ENDOSCOPIC PROCEDURE TODAY: Refer to the procedure report and other information in the discharge instructions given to you for any specific questions about what was found during the examination. If this information does not answer your questions, please call [place] office at [phone] to clarify.  ° °YOU SHOULD EXPECT: Some feelings of bloating in the abdomen. Passage of more gas than usual. Walking can help get rid of the air that was put into your GI tract during the procedure and reduce the bloating. If you had a lower endoscopy (such as a colonoscopy or flexible sigmoidoscopy) you may notice spotting of blood in your stool or on the toilet paper. Some abdominal soreness may be present for a day or two, also. ° °DIET: Your first meal following the procedure should be a light meal and then it is ok to progress to your normal diet. A half-sandwich or bowl of soup is an example of a good first meal. Heavy or fried foods are harder to digest and may make you feel nauseous or bloated. Drink plenty of fluids but you should avoid alcoholic beverages for 24 hours. If you had a esophageal dilation, please see attached instructions for diet.   ° °ACTIVITY: Your care partner should take you home directly after the procedure. You should plan to take it easy, moving slowly for the rest of the day. You can resume normal activity the day after the procedure however YOU SHOULD NOT DRIVE, use power tools, machinery or perform tasks that involve climbing or major physical exertion for 24 hours (because of the sedation medicines used during the test).  ° °SYMPTOMS TO REPORT IMMEDIATELY: °A gastroenterologist can be reached at any hour. Please call [phone]  for any of the following symptoms:  °Following lower endoscopy (colonoscopy, flexible sigmoidoscopy) °Excessive amounts of blood in the stool  °Significant tenderness, worsening of abdominal pains  °Swelling of the abdomen that is new, acute  °Fever of 100° or  higher  °Following upper endoscopy (EGD, EUS, ERCP, esophageal dilation) °Vomiting of blood or coffee ground material  °New, significant abdominal pain  °New, significant chest pain or pain under the shoulder blades  °Painful or persistently difficult swallowing  °New shortness of breath  °Black, tarry-looking or red, bloody stools ° °FOLLOW UP:  °If any biopsies were taken you will be contacted by phone or by letter within the next 1-3 weeks. Call [phone]  if you have not heard about the biopsies in 3 weeks.  °Please also call with any specific questions about appointments or follow up tests. ° °

## 2021-11-24 NOTE — Transfer of Care (Signed)
Immediate Anesthesia Transfer of Care Note  Patient: Bobby Mcbride  Procedure(s) Performed: ESOPHAGOGASTRODUODENOSCOPY (EGD) WITH PROPOFOL Balloon dilation wire-guided  Patient Location: PACU and Endoscopy Unit  Anesthesia Type:General  Level of Consciousness: awake, alert , oriented and patient cooperative  Airway & Oxygen Therapy: Patient Spontanous Breathing and Patient connected to face mask oxygen  Post-op Assessment: Report given to RN, Post -op Vital signs reviewed and stable and Patient moving all extremities  Post vital signs: Reviewed and stable  Last Vitals:  Vitals Value Taken Time  BP    Temp    Pulse    Resp    SpO2      Last Pain:  Vitals:   11/24/21 1454  TempSrc: Temporal  PainSc: 10-Worst pain ever         Complications: No notable events documented.

## 2021-11-24 NOTE — Consult Note (Addendum)
Consultation  Referring Provider:   Dr. Zenia Resides   Primary Care Physician:  Michela Pitcher, NP Primary Gastroenterologist:  Dr. Ardis Hughs       Reason for Consultation:  Food Impaction            HPI:   Bobby Mcbride is a 51 y.o. male with a past medical history as listed below including previous food impaction and peptic appearing stricture last dilated February 2023, GERD and others listed below, who presented to the ER today with a food impaction.    Today, patient is found with his wife by his side who assists with his history.  She tells me this has happened maybe 4 or 5 times.  Patient tells me through his tears that he had been noticing that it was harder and harder for him to swallow and that Dr. Ardis Hughs previously told him he may need an EGD every 6 months in order to help with this.  They had already made an appointment in our clinic next week.  Apparently had a biscuit and chocolate bar for breakfast this morning which seemed to go down fine, but then had roast beef and a bread roll for lunch which did not move after he swallowed it.  Apparently he "scared all my coworkers", he called his wife and she tells me when she saw him he could not even swallow his own spit.  He tried to swallow a glass full of sweet tea after eating and that came right out.  Since sitting in the ER he does seem to be handling his secretions, but tells me he still feels like there is a big "glob stuck in there".  Continues on Pantoprazole 40 mg daily at home.    Denies fever, chills, weight loss, change in bowel habits, abdominal pain or nausea.     GI history: 07/2011 EGD showed non-bleeding Mallory Weiss tear, mild duodenitis and also nodularity at GE junction, biopsies actually showed inflamed cardia. No h.pylori.  11/2017 EGD LA grade A esophagitis, ortherwise normal 09/2018 EGD LA Grade A esophagitis , mild stenosis at GEJ dilated to 21mm- had laryngeal spasm and unable to proceed further. Pathology shows  gastritis, no dysplasia.  08/2011 colonoscopy normal other than hemorrhoids, EGD 04/2021 Dr. Ardis Hughs indication dysphagia, findings esophagitis, focal peptic appearing stricture that was biopsied.  Biopsies only showed excessive eosinophils but no sign of tumor or cancer.  GE junction was dilated to 20 mm, he was advised to stay on PPI twice daily for at least 2 or 3 months and then back down to once daily. Office visit 08/03/2021 with Dr. Ardis Hughs at that time discussed some weight loss.  He had a CT soft tissue neck with contrast 4/23 that showed partially edentulous with multiple caries, swallowing had been better at that time since EGD in February that time discussed that findings on recent EGD did not explain chronic nausea and intermittent vomiting which been going on for 2 years, noted a high number of eosinophils at his GE junction which could be reflux related, he had labs and was arranged for CT AP also recommended colonoscopy. 08/11/2021 CT AP increased hepatic steatosis and small fat-containing left inguinal hernia versus per [place] cord lipoma-at that time an EGD was added to his already scheduled colonoscopy 09/01/21 EGD and colonoscopy with mild nonspecific gastritis and duodenitis and a small hiatal hernia; biopsies showed high eosinophil count and Dr. 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   followed by dr Christella Hartigan   GERD (gastroesophageal reflux disease)    Headache(784.0)     Hemorrhoids    Hiatal hernia    History of esophageal stricture    and stenosis ,  s/p dilatation's   History of esophagitis    History of stomach ulcers    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   hospital admission lower GI bleed,  per EGD small non-bleeding mallory-weiss tear (no intervention) excessive nsaid use   Migraine with aura    Mitral valve prolapse    per pt dx age 43 had work-up done including echo many yrs ago  (10-07-2021  pt stated pcp at times hears murmur but a symptomatic)   Perianal lesion     Past Surgical History:  Procedure Laterality Date   APPENDECTOMY  1990   BIOPSY  09/03/2018   Procedure: BIOPSY;  Surgeon: Lynann Bologna, MD;  Location: [facility];  Service: Endoscopy;;   COLONOSCOPY WITH ESOPHAGOGASTRODUODENOSCOPY (EGD)  08/31/2021   by dr Christella Hartigan   ELBOW SURGERY Right 1987   ESOPHAGOGASTRODUODENOSCOPY  08/18/2011   Procedure: ESOPHAGOGASTRODUODENOSCOPY (EGD);  Surgeon: Beverley Fiedler, MD;  Location: [facility];  Service: Gastroenterology;  Laterality: N/A;   ESOPHAGOGASTRODUODENOSCOPY  11/11/2011   Procedure: ESOPHAGOGASTRODUODENOSCOPY (EGD);  Surgeon: Iva Boop, MD;  Location: [facility];  Service: Endoscopy;  Laterality: N/A;   ESOPHAGOGASTRODUODENOSCOPY (EGD) WITH PROPOFOL N/A 09/03/2018   Procedure: ESOPHAGOGASTRODUODENOSCOPY (EGD) WITH PROPOFOL;  Surgeon: Lynann Bologna, MD;  Location: [facility];  Service: Endoscopy;  Laterality: N/A;   KNEE ARTHROSCOPY Right 02/26/2009   @[facility]  by dr c. blackman   MASS EXCISION N/A 10/15/2021   Procedure: EXCISION OF PERIANAL MASS;  Surgeon: 12/15/2021, MD;  Location: [facility];  Service: General;  Laterality: N/A;   MULTIPLE TOOTH EXTRACTIONS     several ---  w/ IV sedation  last extraction 05/ 2023   RECTAL EXAM UNDER ANESTHESIA N/A 10/15/2021   Procedure: ANORECTAL EXAM UNDER ANESTHESIA;  Surgeon: 12/15/2021, MD;  Location: [facility];  Service: General;   Laterality: N/A;   SHOULDER ARTHROSCOPY W/ ROTATOR CUFF REPAIR Right 2012   SKIN BIOPSY N/A 10/15/2021   Procedure: BIOPSY OF GLUTEAL SKIN;  Surgeon: 12/15/2021, MD;  Location: [facility];  Service: General;  Laterality: N/A;    Family History  Problem Relation Age of Onset   Hypertension Mother    Stroke Mother    Migraines Mother    Diabetes Mother    Prostate cancer Father    Hypertension Father    Stroke Father    Heart failure Father    Asthma Father    Colon polyps Father    Colon cancer Neg Hx    Esophageal cancer Neg Hx    Stomach cancer Neg Hx    Rectal cancer Neg Hx     Social History   Tobacco Use   Smoking status: Never   Smokeless tobacco: Never  Vaping Use   Vaping Use: Never used  Substance Use Topics   Alcohol use: No   Drug use: Never    Prior to Admission medications   Medication Sig Start Date End Date Taking? Authorizing Provider  benzonatate (TESSALON) 100 MG capsule Take 1 capsule (100 mg total) by mouth every 8 (eight) hours. 08/07/21   Luna Fuse, MD  fluticasone (FLONASE) 50 MCG/ACT nasal spray Place 2 sprays into both nostrils daily. Patient taking differently: Place 2 sprays into both nostrils daily as needed. 08/06/21   Michela Pitcher, NP  lidocaine (XYLOCAINE) 2 % solution Swallow 10 ml every 4-6 hours as needed for pain with swallowing 06/28/21   Dion Saucier A, PA  methylcellulose (CITRUCEL) oral powder Take 1 packet by mouth daily. 08/03/21   Milus Banister, MD  ondansetron (ZOFRAN) 4 MG tablet Take 1 tablet (4 mg total) by mouth 2 (two) times daily as needed for nausea or vomiting. 08/03/21   Milus Banister, MD  oxyCODONE-acetaminophen (PERCOCET/ROXICET) 5-325 MG tablet Take 1 tablet by mouth every 6 (six) hours as needed for up to 4 doses for severe pain. 08/07/21   Luna Fuse, MD  pantoprazole (PROTONIX) 40 MG tablet Take 1 tablet (40 mg total) by mouth daily. Patient taking differently: Take 40 mg by  mouth daily. 08/03/21   Milus Banister, MD    Current Facility-Administered Medications  Medication Dose Route Frequency Provider Last Rate Last Admin   glucagon (human recombinant) (GLUCAGEN) injection 1 mg  1 mg Intravenous Once Tacy Learn, PA-C       Current Outpatient Medications  Medication Sig Dispense Refill   benzonatate (TESSALON) 100 MG capsule Take 1 capsule (100 mg total) by mouth every 8 (eight) hours. 21 capsule 0   fluticasone (FLONASE) 50 MCG/ACT nasal spray Place 2 sprays into both nostrils daily. (Patient taking differently: Place 2 sprays into both nostrils daily as needed.) 16 g 0   lidocaine (XYLOCAINE) 2 % solution Swallow 10 ml every 4-6 hours as needed for pain with swallowing 200 mL 0   methylcellulose (CITRUCEL) oral powder Take 1 packet by mouth daily.     ondansetron (ZOFRAN) 4 MG tablet Take 1 tablet (4 mg total) by mouth 2 (two) times daily as needed for nausea or vomiting. 60 tablet 6   oxyCODONE-acetaminophen (PERCOCET/ROXICET) 5-325 MG tablet Take 1 tablet by mouth every 6 (six) hours as needed for up to 4 doses for severe pain. 4 tablet 0   pantoprazole (PROTONIX) 40 MG tablet Take 1 tablet (40 mg total) by mouth daily. (Patient taking differently: Take 40 mg by mouth daily.) 60 tablet 1    Allergies as of 11/24/2021 - Review Complete 11/24/2021  Allergen Reaction Noted   Codeine Other (See Comments)    Hydrocodone-acetaminophen Other (See Comments)    Food  10/18/2015   Ibuprofen  12/28/2016   Compazine [prochlorperazine edisylate] Anxiety 08/02/2011     Review of Systems:    Constitutional: No weight loss, fever or chills Skin: No rash Cardiovascular: No chest pain  Respiratory: No SOB  Gastrointestinal: See HPI and otherwise negative Genitourinary: No dysuria  Neurological: No headache, dizziness or syncope Musculoskeletal: No new muscle or joint pain Hematologic: No bleeding  Psychiatric: No history of depression or anxiety     Physical Exam:  Vital signs in last 24 hours: Temp:  [98 F (36.7 C)] 98 F (36.7 C) (09/13 1405) Pulse Rate:  [93] 93 (09/13 1405) Resp:  [16] 16 (09/13 1405) BP: (147)/(113) 147/113 (09/13 1405)  SpO2:  [99 %] 99 % (09/13 1405)   General:   Pleasant tearful Caucasian male appears to be in NAD, Well developed, Well nourished, alert and cooperative Head:  Normocephalic and atraumatic. Eyes:   PEERL, EOMI. No icterus. Conjunctiva pink. Ears:  Normal auditory acuity. Neck:  Supple Throat: Oral cavity and pharynx without inflammation, swelling or lesion. Teeth in good condition. Lungs: Respirations even and unlabored. Lungs clear to auscultation bilaterally.   No wheezes, crackles, or rhonchi.  Heart: Normal S1, S2. No MRG. Regular rate and rhythm. No peripheral edema, cyanosis or pallor.  Abdomen:  Soft, nondistended, nontender. No rebound or guarding. Normal bowel sounds. No appreciable masses or hepatomegaly. Rectal:  Not performed.  Msk:  Symmetrical without gross deformities. Peripheral pulses intact.  Extremities:  Without edema, no deformity or joint abnormality.  Neurologic:  Alert and  oriented x4;  grossly normal neurologically.  Skin:   Dry and intact without significant lesions or rashes. Psychiatric: Demonstrates good judgement and reason without abnormal affect or behaviors.   LAB RESULTS: No results.   Impression / Plan:   Impression: 1.  Food impaction: Roast beef in a bread roll which has not gone down over the past 2 to 3 hours, initially unable to handle secretions, now seems to be able to do that, previous stricture with dilation in February of this year, last EGD in June with nonspecific gastritis/duodenitis; likely stricture 2.  GERD: Typically controlled on Pantoprazole 40 mg daily  Plan: 1.  Patient will have emergent EGD for removal of food impaction with Dr. Marina Goodell.  Did discuss risks, benefits, limitations and alternatives and patient agrees to proceed. 2.   Patient will be n.p.o. for now 3.  Further recommendations after time of EGD.  Thank you for your kind consultation.  Violet Baldy Lemmon  11/24/2021, 2:15 PM  GI ATTENDING  History, laboratories, x-rays, prior endoscopy reports reviewed.  Patient personally seen and examined.  Wife at bedside.  Patient presents with acute food impaction after consuming [employer] roast beef.  Unable to handle secretions.  Having some chest discomfort associated with his impaction.  He is now for urgent endoscopy.  He will need to be intubated to protect his airway.  The nature of the procedure, as well as the risks, benefits, and alternatives were carefully and thoroughly reviewed with the patient. Ample time for discussion and questions allowed. The patient understood, was satisfied, and agreed to proceed.  Wilhemina Bonito. Eda Keys., M.D. [facility] Division of Gastroenterology

## 2021-11-24 NOTE — ED Provider Notes (Signed)
[facility]-EMERGENCY DEPT Provider Note   CSN: [id number] Arrival date & time: 11/24/21  1358     History  Chief Complaint  Patient presents with   Food Bolus    SIGMUND MORERA is a 51 y.o. male.  Pt complains of impacted food bolus, sees [facility], frequent endoscopy for same. Last endoscopy was Feb, told he would need to be dilated q 6 months. Roast beef or bread.        Home Medications Prior to Admission medications   Medication Sig Start Date End Date Taking? Authorizing Provider  hydrocortisone (ANUSOL-HC) 2.5 % rectal cream Apply 1 Application topically daily as needed for hemorrhoids. 11/01/21  Yes [provider]  benzonatate (TESSALON) 100 MG capsule Take 1 capsule (100 mg total) by mouth every 8 (eight) hours. Patient not taking: Reported on 11/24/2021 08/07/21   Cheryll Cockayne, MD  fluticasone [facility]) 50 MCG/ACT nasal spray Place 2 sprays into both nostrils daily. Patient not taking: Reported on 11/24/2021 08/06/21   Eden Emms, NP  lidocaine (XYLOCAINE) 2 % solution Swallow 10 ml every 4-6 hours as needed for pain with swallowing Patient not taking: Reported on 11/24/2021 06/28/21   Peter Garter, PA  methylcellulose (CITRUCEL) oral powder Take 1 packet by mouth daily. Patient not taking: Reported on 11/24/2021 08/03/21   Rachael Fee, MD  ondansetron (ZOFRAN) 4 MG tablet Take 1 tablet (4 mg total) by mouth 2 (two) times daily as needed for nausea or vomiting. Patient not taking: Reported on 11/24/2021 08/03/21   Rachael Fee, MD  oxyCODONE-acetaminophen (PERCOCET/ROXICET) 5-325 MG tablet Take 1 tablet by mouth every 6 (six) hours as needed for up to 4 doses for severe pain. Patient not taking: Reported on 11/24/2021 08/07/21   Cheryll Cockayne, MD  pantoprazole (PROTONIX) 40 MG tablet Take 1 tablet (40 mg total) by mouth daily. Patient not taking: Reported on 11/24/2021 08/03/21   Rachael Fee, MD  pantoprazole (PROTONIX) 40  MG tablet Take 1 tablet (40 mg total) by mouth daily. 11/24/21   Hilarie Fredrickson, MD      Allergies    Codeine, Hydrocodone-acetaminophen, Food, Ibuprofen, and Compazine [prochlorperazine edisylate]    Review of Systems   Review of Systems Negative except as per HPI Physical Exam Updated Vital Signs BP 136/69   Pulse 75   Temp 98.2 F (36.8 C) (Temporal)   Resp 19   Ht 5\' 7"  (1.702 m)   Wt 81.6 kg   SpO2 96%   BMI 28.19 kg/m  Physical Exam Vitals and nursing note reviewed.  Constitutional:      General: He is not in acute distress.    Appearance: He is well-developed. He is not diaphoretic.     Comments: Appears uncomfortable secondary to impacted food bolus, no active vomiting  HENT:     Head: Normocephalic and atraumatic.  Cardiovascular:     Rate and Rhythm: Normal rate and regular rhythm.     Pulses: Normal pulses.     Heart sounds: Normal heart sounds.  Pulmonary:     Effort: Pulmonary effort is normal.     Breath sounds: Normal breath sounds.  Abdominal:     Palpations: Abdomen is soft.     Tenderness: There is no abdominal tenderness.  Musculoskeletal:     Right lower leg: No edema.     Left lower leg: No edema.  Skin:    General: Skin is warm and dry.  Neurological:  Mental Status: He is alert and oriented to person, place, and time.  Psychiatric:        Behavior: Behavior normal.     ED Results / Procedures / Treatments   Labs (all labs ordered are listed, but only abnormal results are displayed) Labs Reviewed - No data to display  EKG None  Radiology No results found.  Procedures Procedures    Medications Ordered in ED Medications  0.9 %  sodium chloride infusion (has no administration in time range)  glucagon (human recombinant) (GLUCAGEN) injection 1 mg (1 mg Intravenous Given 11/24/21 1430)    ED Course/ Medical Decision Making/ A&P                           Medical Decision Making Risk Prescription drug management. Decision  regarding hospitalization.   This patient presents to the ED for concern of impacted food bolus, this involves an extensive number of treatment options, and is a complaint that carries with it a high risk of complications and morbidity.  The differential diagnosis includes esophageal obstruction, esophageal stricture   Co morbidities that complicate the patient evaluation  GERD, stomach ulcers, mitral valve prolapse, hiatal hernia, esophagitis   Additional history obtained:  Additional history obtained from wife at bedside who assist with history as above External records from outside source obtained and reviewed including recent visit with general surgery for colonoscopy dated 4/23   Consultations Obtained:  I requested consultation with the [facility] team,  and discussed lab and imaging findings as well as pertinent plan - they recommend: Endoscopy   Problem List / ED Course / Critical interventions / Medication management  51 year old male with history of esophageal strictures managed by [facility] presents with concern for impacted food bolus after eating roast beef and bread today.  Patient tried drinking some tea without improvement in his symptoms.  Arrives emergency room appearing very uncomfortable holding his chest, tolerating secretions.  Patient was given glucagon without improvement in his symptoms.  GI was consulted and patient was taken to endoscopy for further evaluation and treatment. I ordered medication including glucagon for impacted food bolus Reevaluation of the patient after these medicines showed that the patient stayed the same I have reviewed the patients home medicines and have made adjustments as needed   Social Determinants of Health:  Lives with wife   Test / Admission - Considered:  Admitted for endoscopy         Final Clinical Impression(s) / ED Diagnoses Final diagnoses:  Esophageal obstruction due to food impaction    Rx / DC  Orders ED Discharge Orders     None         Alden Hipp 11/24/21 1857    Lorre Nick, MD 11/25/21 1355

## 2021-11-24 NOTE — Anesthesia Postprocedure Evaluation (Signed)
Anesthesia Post Note  Patient: Bobby Mcbride  Procedure(s) Performed: ESOPHAGOGASTRODUODENOSCOPY (EGD) WITH PROPOFOL Balloon dilation wire-guided     Patient location during evaluation: PACU Anesthesia Type: General Level of consciousness: awake and alert and oriented Pain management: pain level controlled Vital Signs Assessment: post-procedure vital signs reviewed and stable Respiratory status: spontaneous breathing, nonlabored ventilation and respiratory function stable Cardiovascular status: blood pressure returned to baseline and stable Postop Assessment: no apparent nausea or vomiting Anesthetic complications: no   No notable events documented.  Last Vitals:  Vitals:   11/24/21 1541 11/24/21 1552  BP: (!) 141/69 135/79  Pulse: 73 77  Resp: (!) 23 12  Temp: 36.8 C   SpO2: 99% 93%    Last Pain:  Vitals:   11/24/21 1552  TempSrc:   PainSc: Asleep                 Del Wiseman A.

## 2021-11-24 NOTE — Telephone Encounter (Signed)
-----   Message from Hilarie Fredrickson, MD sent at 11/24/2021  3:59 PM EDT ----- Regarding: Needs medication-pantoprazole Bonita Quin,  Patient had been followed by Dr. Christella Hartigan.  Comes into the hospital with food impaction.  I just took care of it endoscopically.  Please prescribe pantoprazole 40 mg daily; #30; 11 refills.  Sent to [facility] on [address].  Thanks  Dr. Marina Goodell

## 2021-11-24 NOTE — Anesthesia Preprocedure Evaluation (Addendum)
Anesthesia Evaluation  Patient identified by MRN, date of birth, ID band Patient awake    Reviewed: Allergy & Precautions, NPO status , Patient's Chart, lab work & pertinent test results  Airway Mallampati: I  TM Distance: >3 FB Neck ROM: Full    Dental  (+) Poor Dentition, Missing, Chipped   Pulmonary neg pulmonary ROS,    Pulmonary exam normal        Cardiovascular negative cardio ROS Normal cardiovascular exam     Neuro/Psych  Headaches, Anxiety  Neuromuscular disease    GI/Hepatic Neg liver ROS, hiatal hernia, GERD  Medicated,Food impaction esophagus Hx/o esophageal stricture S/P dilation several times    Endo/Other  negative endocrine ROS  Renal/GU Renal diseaseRenal cyst     Musculoskeletal negative musculoskeletal ROS (+)   Abdominal   Peds  Hematology negative hematology ROS (+)   Anesthesia Other Findings   Reproductive/Obstetrics                            Anesthesia Physical  Anesthesia Plan  ASA: 2  Anesthesia Plan: General   Post-op Pain Management: Minimal or no pain anticipated   Induction: Intravenous  PONV Risk Score and Plan: 2 and Treatment may vary due to age or medical condition and Propofol infusion  Airway Management Planned: Oral ETT  Additional Equipment: None  Intra-op Plan:   Post-operative Plan: Extubation in OR  Informed Consent: I have reviewed the patients History and Physical, chart, labs and discussed the procedure including the risks, benefits and alternatives for the proposed anesthesia with the patient or authorized representative who has indicated his/her understanding and acceptance.     Dental advisory given  Plan Discussed with: CRNA and Anesthesiologist  Anesthesia Plan Comments:        Anesthesia Quick Evaluation

## 2021-11-25 ENCOUNTER — Encounter (HOSPITAL_COMMUNITY): Payer: Self-pay | Admitting: Internal Medicine

## 2021-11-26 NOTE — Progress Notes (Deleted)
11/26/2021 Bobby Mcbride [id number] 10-27-1970   ASSESSMENT AND PLAN:    History of Present Illness:  51 y.o. male  with a past medical history of GERD, PUD, mallory Weiss tear, migraines and others listed below, known to [place] returns to clinic today for follow up of EGD/Colonoscopy.  08/2011 colonoscopy normal other than hemorrhoids, he is due this June.  07/2011 EGD showed non-bleeding Mallory Weiss tear, mild duodenitis and also nodularity at GE junction, biopsies actually showed inflamed cardia. No h.pylori.  11/24/2017 EGD LA grade A esophagitis, ortherwise normal 09/25/2018 EGD LA Grade A esophagitis , mild stenosis at GEJ dilated to 33mm- had laryngeal spasm and unable to proceed further. Pathology shows gastritis, no dysplasia.  05/10/2021 office visit with me for weight loss, nausea vomiting set up for both EGD and colonoscopy with Dr. Christella Hartigan.  EGD 04/2021 Dr. Christella Hartigan for dysphagia showed esophagitis, focal peptic appearing stricture biopsied, no eosinophils no signs of tumor or cancer.  GE junction dilated 20 mm.  PPI twice daily 08/03/2021 office visit with Dr. Christella Hartigan for weight loss CT soft tissue neck with contrast showed partially and dentulous with multiple caries, swelling been better since EGD February 08/11/2021 increase hepatic steatosis, inguinal hernia cord lipoma 09/01/2021 endoscopy showed mild nonspecific gastritis, small hiatal hernia 09/01/2021 colonoscopy showed bowel prep 1 cm rubbery perianal nodule, patient referred to general surgery for resection of small perianal nodule. Thought possibly in the EOE 1 start on Medrol Dosepak. 11/24/2021 ER visit for food impaction Stricture at gastroesophageal junction edema and mild esophagitis acute food bolus had just passed in the stomach dilated to 20 mm  Wife Chip Boer is here with him, provides some of the history.  Has been getting progressively worse for the past year.  He has issue with subs, chicken, steak, will  get stuck and have to cough until it comes.  Feels like something is there right now, has been having some very localized left chest pain, worse with coughing. No SOB, no CP with exertion.  He is not on any medications at this time.  He denies GERD.  He takes Goody powders for headaches once a week, he is on ibuprofen 1 pill once a week. Denies smoking/ETOH. He is Theatre stage manager.    Current Medications:     Current Outpatient Medications (Respiratory):    benzonatate (TESSALON) 100 MG capsule, Take 1 capsule (100 mg total) by mouth every 8 (eight) hours. (Patient not taking: Reported on 11/24/2021)   fluticasone (FLONASE) 50 MCG/ACT nasal spray, Place 2 sprays into both nostrils daily. (Patient not taking: Reported on 11/24/2021)  Current Outpatient Medications (Analgesics):    oxyCODONE-acetaminophen (PERCOCET/ROXICET) 5-325 MG tablet, Take 1 tablet by mouth every 6 (six) hours as needed for up to 4 doses for severe pain. (Patient not taking: Reported on 11/24/2021)   Current Outpatient Medications (Other):    hydrocortisone (ANUSOL-HC) 2.5 % rectal cream, Apply 1 Application topically daily as needed for hemorrhoids.   lidocaine (XYLOCAINE) 2 % solution, Swallow 10 ml every 4-6 hours as needed for pain with swallowing (Patient not taking: Reported on 11/24/2021)   methylcellulose (CITRUCEL) oral powder, Take 1 packet by mouth daily. (Patient not taking: Reported on 11/24/2021)   ondansetron (ZOFRAN) 4 MG tablet, Take 1 tablet (4 mg total) by mouth 2 (two) times daily as needed for nausea or vomiting. (Patient not taking: Reported on 11/24/2021)   pantoprazole (PROTONIX) 40 MG tablet, Take 1 tablet (40 mg total) by mouth daily. (Patient not  taking: Reported on 11/24/2021)   pantoprazole (PROTONIX) 40 MG tablet, Take 1 tablet (40 mg total) by mouth daily.  Surgical History:  He  has a past surgical history that includes Elbow surgery (Right, 1987); Appendectomy (1990); Knee arthroscopy (Right,  02/26/2009); Shoulder arthroscopy w/ rotator cuff repair (Right, 2012); Esophagogastroduodenoscopy (08/18/2011); Esophagogastroduodenoscopy (11/11/2011); Esophagogastroduodenoscopy (egd) with propofol (N/A, 09/03/2018); biopsy (09/03/2018); Colonoscopy with esophagogastroduodenoscopy (egd) (08/31/2021); Multiple tooth extractions; Mass excision (N/A, 10/15/2021); Skin biopsy (N/A, 10/15/2021); Rectal exam under anesthesia (N/A, 10/15/2021); and Esophagogastroduodenoscopy (egd) with propofol (N/A, 11/24/2021). Family History:  His family history includes Asthma in his father; Colon polyps in his father; Diabetes in his mother; Heart failure in his father; Hypertension in his father and mother; Migraines in his mother; Prostate cancer in his father; Stroke in his father and mother. Social History:   reports that he has never smoked. He has never used smokeless tobacco. He reports that he does not drink alcohol and does not use drugs.  Current Medications, Allergies, Past Medical History, Past Surgical History, Family History and Social History were reviewed in [employer] record.  Physical Exam: There were no vitals taken for this visit. General:   Pleasant, well developed male in no acute distress Eyes: sclerae anicteric,conjunctive pink  Heart:  regular rate and rhythm, positive pain on palpation left chest Pulm: Clear anteriorly; no wheezing Abdomen:  Soft, Obese AB, skin exam normal, Normal bowel sounds.  NO  tenderness . Without guarding and Without rebound, without hepatomegaly. Extremities:  Without edema. Peripheral pulses intact.  Neurologic:  Alert and  oriented x4;  grossly normal neurologically. Skin:   Dry and intact without significant lesions or rashes. Psychiatric: Demonstrates good judgement and reason without abnormal affect or behaviors.  Doree Albee, PA-C 11/26/21

## 2021-11-29 ENCOUNTER — Ambulatory Visit: Payer: BC Managed Care – PPO | Admitting: Physician Assistant

## 2021-12-16 ENCOUNTER — Encounter: Payer: Self-pay | Admitting: Orthopedic Surgery

## 2021-12-16 ENCOUNTER — Ambulatory Visit (INDEPENDENT_AMBULATORY_CARE_PROVIDER_SITE_OTHER): Payer: BC Managed Care – PPO | Admitting: Orthopedic Surgery

## 2021-12-16 ENCOUNTER — Ambulatory Visit (INDEPENDENT_AMBULATORY_CARE_PROVIDER_SITE_OTHER): Payer: BC Managed Care – PPO

## 2021-12-16 VITALS — BP 126/84 | HR 73 | Ht 67.0 in | Wt 180.0 lb

## 2021-12-16 DIAGNOSIS — M5416 Radiculopathy, lumbar region: Secondary | ICD-10-CM

## 2021-12-16 NOTE — Progress Notes (Signed)
Orthopedic Spine Surgery Office Note  Assessment: Patient is a 51 y.o. male with chronic axial low back pain and occasional left leg radicular type pain   Plan: -Explained that initially conservative treatment is tried as a significant number of patients may experience relief with these treatment modalities. Discussed that the conservative treatments include:  -activity modification  -physical therapy  -over the counter pain medications  -medrol dosepak  -lumbar steroid injections -Patient has tried injections in the past (last one in 2021), over-the-counter pain medications, activity modification -Patient's main complaint is axial back pain with occasional radicular type pain down his left leg.  Since he has not tried physical therapy, I recommended physical therapy and doing home exercises as initial treatment -Patient agreed to try the therapy and home exercises, he said he wanted to call back if he continues to have issues as opposed to scheduling follow-up.  So, I will see him back on an as-needed basis   Patient expressed understanding of the plan and all questions were answered to their satisfaction.   ___________________________________________________________________________   History:  Patient is a 51 y.o. male who presents today for lumbar spine.  Patient reports many years of lower back pain.  Over the years it has come and gone.  It is recently come back and he feels it in his lower lumbar spine.  On some occasions, the pain radiates into his left leg along the posterior aspect.  He gets numbness and tingling in that same distribution of the left leg.  He has no right leg symptoms.  He has not noticed any weakness.  He notes that activity makes it worse and rest seems to make it better.   Weakness: Denies Symptoms of imbalance: Denies Paresthesias and numbness: Yes, into the left leg occasionally Bowel or bladder incontinence: Denies Saddle anesthesia: Denies  Treatments  tried: Injections several years ago, over-the-counter pain medications, activity modification  Review of systems: Denies fevers and chills, night sweats, unexplained weight loss, history of cancer, pain that wakes them at night  Past medical history: Hemorrhoids GERD Esophageal stricture Anxiety  Allergies: Codeine, hydrocodone, several foods, ibuprofen, Compazine  Past surgical history:  Shoulder arthroscopy Tooth extraction Perianal mass excision knee arthroscopy Elbow surgery Appendectomy  Social history: Denies use of nicotine product (smoking, vaping, patches, smokeless) Alcohol use: Denies Denies recreational drug use  Physical Exam:  General: no acute distress, appears stated age Neurologic: alert, answering questions appropriately, following commands Respiratory: unlabored breathing on room air, symmetric chest rise Psychiatric: appropriate affect, normal cadence to speech   MSK (spine):  -Strength exam      Left  Right EHL    4/5  4/5 TA    5/5  5/5 GSC    5/5  5/5 Knee extension  5/5  5/5 Hip flexion   5/5  5/5  -Sensory exam    Sensation intact to light touch in L3-S1 nerve distributions of bilateral lower extremities  -Achilles DTR: 2/4 on the left, 2/4 on the right -Patellar tendon DTR: 2/4 on the left, 2/4 on the right  -Straight leg raise: Negative -Contralateral straight leg raise: Negative -Femoral nerve stretch test: Negative -Clonus: no beats bilaterally  -Left hip exam: Negative Stinchfield, mild pain with extreme of internal rotation but no pain the rest of range of motion at the hip, negative Pearlean Brownie -Right hip exam: Negative Stinchfield, no pain through range of motion of the hip, negative Faber  Imaging: X-ray of the lumbar spine from 12/16/2021 was independently reviewed  and interpreted, showing no acute osseous abnormality.  No evidence of instability.  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  Early degenerative changes  within bilateral hips.   Patient name: Bobby Mcbride Patient MRN: [id number] Date of visit: 12/16/21

## 2022-05-16 ENCOUNTER — Telehealth: Payer: Self-pay | Admitting: Gastroenterology

## 2022-05-16 ENCOUNTER — Telehealth: Payer: Self-pay | Admitting: Internal Medicine

## 2022-05-16 ENCOUNTER — Other Ambulatory Visit: Payer: Self-pay

## 2022-05-16 DIAGNOSIS — R1319 Other dysphagia: Secondary | ICD-10-CM

## 2022-05-16 MED ORDER — PANTOPRAZOLE SODIUM 40 MG PO TBEC: 40.0000 mg | DELAYED_RELEASE_TABLET | Freq: Every day | ORAL | 3 refills | Status: DC

## 2022-05-16 NOTE — Telephone Encounter (Signed)
Pt states he ate some beef yesterday that his wife cut up really fine but he feels like it is stuck again. He has discomfort in his epigastric area and it hurts when he takes a deep breath. He is able to drink water but tried to eat runny grits this am and some came right back up. Pt wants to know what Dr. Henrene Pastor recommends, he doesn't want to get where he feels like he can't breathe again. Please advise.

## 2022-05-16 NOTE — Telephone Encounter (Signed)
Spoke with pt and he is aware of Dr. Blanch Media recommendations. Script sent to pharmacy. Pt scheduled for EGD with dil in the [facility] 05/20/22 at 10:30am, pt to arrive there at 9:30am. Pt to be NPO after midnight. He is aware.

## 2022-05-16 NOTE — Telephone Encounter (Signed)
Inbound call from patient requesting to speak with a nurse... Patient is Dr.Jacobs patient but saw Dr.Perry at the hospital form a procedure .Marland KitchenPatient stated that he was supposed to come back on December but he didn't and now food is getting stuck and ih getting hard for him to breath please advise.

## 2022-05-16 NOTE — Telephone Encounter (Signed)
If he is able to keep down liquids I recommend the following: 1.  Clear liquids only for 24 hours 2.  Start pantoprazole 40 mg daily (if not on a daily PPI already) 3.  Soft foods only starting tomorrow.  Avoid meats and breads 4.  Schedule EGD with dilation in the [facility]

## 2022-05-17 NOTE — Telephone Encounter (Signed)
Pt states he was very busy at work yesterday and wanted to know Dr. Blanch Media recommendations again. Reviewed everything with him and he is aware.

## 2022-05-17 NOTE — Telephone Encounter (Signed)
Inbound call from patient requesting a call back to discuss procedure. Please advise.

## 2022-05-20 ENCOUNTER — Encounter: Payer: Self-pay | Admitting: Internal Medicine

## 2022-05-20 ENCOUNTER — Ambulatory Visit (AMBULATORY_SURGERY_CENTER): Payer: BC Managed Care – PPO | Admitting: Internal Medicine

## 2022-05-20 VITALS — BP 116/72 | HR 55 | Temp 97.3°F | Resp 17 | Ht 67.0 in | Wt 180.0 lb

## 2022-05-20 DIAGNOSIS — K222 Esophageal obstruction: Secondary | ICD-10-CM

## 2022-05-20 DIAGNOSIS — R1319 Other dysphagia: Secondary | ICD-10-CM

## 2022-05-20 DIAGNOSIS — K219 Gastro-esophageal reflux disease without esophagitis: Secondary | ICD-10-CM

## 2022-05-20 MED ORDER — SODIUM CHLORIDE 0.9 % IV SOLN: 500.0000 mL | Freq: Once | INTRAVENOUS | Status: DC

## 2022-05-20 NOTE — Op Note (Signed)
[place] Patient Name: Bobby Mcbride Procedure Date: 05/20/2022 10:25 AM MRN: [id number] Endoscopist: Docia Chuck. Henrene Pastor , MD, [id number] Age: 52 Referring MD:  Date of Birth: [birth year] Gender: Male Account #: [id number] Procedure:                Upper GI endoscopy Indications:              Dysphagia, Therapeutic procedure, Esophageal reflux Medicines:                Monitored Anesthesia Care Procedure:                Pre-Anesthesia Assessment:                           - Prior to the procedure, a History and Physical                            was performed, and patient medications and                            allergies were reviewed. The patient's tolerance of                            previous anesthesia was also reviewed. The risks                            and benefits of the procedure and the sedation                            options and risks were discussed with the patient.                            All questions were answered, and informed consent                            was obtained. Prior Anticoagulants: The patient has                            taken no anticoagulant or antiplatelet agents. ASA                            Grade Assessment: II - A patient with mild systemic                            disease. After reviewing the risks and benefits,                            the patient was deemed in satisfactory condition to                            undergo the procedure.                           After obtaining informed consent, the endoscope was  passed under direct vision. Throughout the                            procedure, the patient's blood pressure, pulse, and                            oxygen saturations were monitored continuously. The                            Olympus Scope [id number] was introduced through the                            mouth, and advanced to the second part of duodenum.                            The  upper GI endoscopy was accomplished without                            difficulty. The patient tolerated the procedure                            well. Scope In: Scope Out: Findings:                 One benign-appearing, intrinsic moderate stenosis                            was found 37 cm from the incisors. This stenosis                            measured 1.5 cm (inner diameter). After completing                            the endoscopic survey, A TTS dilator was passed                            through the scope. Dilation with an 18-19-20 mm                            balloon dilator was performed to 20 mm. There was                            moderate disruption of the stricture consistent                            with good effect.                           The exam of the esophagus also revealed friability                            and edema at the gastroesophageal mucosal junction                            consistent  with reflux esophagitis.                           The stomach was normal. No hernia noted.                           The examined duodenum was normal.                           The cardia and gastric fundus were normal on                            retroflexion. Complications:            No immediate complications. Estimated Blood Loss:     Estimated blood loss: none. Impression:               - Benign-appearing esophageal stenosis. Dilated.                            Reflux esophagitis.                           - Normal stomach. Small hiatal hernia.                           - Normal examined duodenum.                           - No specimens collected. Recommendation:           1. Patient has a contact number available for                            emergencies. The signs and symptoms of potential                            delayed complications were discussed with the                            patient. Return to normal activities tomorrow.                             Written discharge instructions were provided to the                            patient.                           2. Post dilation diet.                           3. Continue present medications.                           4. CONTINUE PANTOPRAZOLE 40 mg EVERYDAY. Please                            make sure  that the patient has 1 year of refills on                            his prescription.                           5. Office follow-up with Dr. Henrene Pastor in 6 to 8 weeks Docia Chuck. Henrene Pastor, MD 05/20/2022 10:57:43 AM This report has been signed electronically.

## 2022-05-20 NOTE — Progress Notes (Signed)
Called to room to assist during endoscopic procedure.  Patient ID and intended procedure confirmed with present staff. Received instructions for my participation in the procedure from the performing physician.  

## 2022-05-20 NOTE — Patient Instructions (Addendum)
Go by the restricted diet today as ordered.  Be sure to take your stomach medication as ordered.  Take it 1/2 hour before breakfast.  It won't work on a full stomach.  Read all handouts given to you by your recovery room nurse.  YOU HAD AN ENDOSCOPIC PROCEDURE TODAY AT [facility]:   Refer to the procedure report that was given to you for any specific questions about what was found during the examination.  If the procedure report does not answer your questions, please call your gastroenterologist to clarify.  If you requested that your care partner not be given the details of your procedure findings, then the procedure report has been included in a sealed envelope for you to review at your convenience later.  YOU SHOULD EXPECT: Some feelings of bloating in the abdomen. Passage of more gas than usual.  Walking can help get rid of the air that was put into your GI tract during the procedure and reduce the bloating.   Please Note:  You might notice some irritation and congestion in your nose or some drainage.  This is from the oxygen used during your procedure.  There is no need for concern and it should clear up in a day or so.  SYMPTOMS TO REPORT IMMEDIATELY: Fever of 100F or higher  Following upper endoscopy (EGD)  Vomiting of blood or coffee ground material  New chest pain or pain under the shoulder blades  Painful or persistently difficult swallowing  New shortness of breath  Fever of 100F or higher  Black, tarry-looking stools  For urgent or emergent issues, a gastroenterologist can be reached at any hour by calling [phone]. Do not use MyChart messaging for urgent concerns.    DIET:  We do recommend nothing  by mouth until 12 noon.  From 12-1 you may have a clear liquids.   Today you are to be on a soft diet for the rest of today. Then you may proceed to your regular diet.  Drink plenty of fluids but you should avoid alcoholic beverages.  ACTIVITY:  You should  plan to take it easy for the rest of today and you should NOT DRIVE or use heavy machinery until tomorrow (because of the sedation medicines used during the test).    FOLLOW UP: Our staff will call the number listed on your records the next business day following your procedure.  We will call around 7:15- 8:00 am to check on you and address any questions or concerns that you may have regarding the information given to you following your procedure. If we do not reach you, we will leave a message.      SIGNATURES/CONFIDENTIALITY: You and/or your care partner have signed paperwork which will be entered into your electronic medical record.  These signatures attest to the fact that that the information above on your After Visit Summary has been reviewed and is understood.  Full responsibility of the confidentiality of this discharge information lies with you and/or your care-partner.

## 2022-05-20 NOTE — Progress Notes (Signed)
HISTORY OF PRESENT ILLNESS:  Bobby Mcbride is a 52 y.o. male with history of GERD complicated by peptic stricture and food impaction requiring endoscopic attention.  Previously dilated September 2023.  Call the office the other day with a significant episode of dysphagia with what sounds like transient food impaction.  Set up for upper endoscopy with dilation today.  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   hospital admission lower GI bleed,  per EGD small non-bleeding mallory-weiss tear (no intervention) excessive nsaid use   Migraine with aura    Mitral valve prolapse    per pt dx age 67 had work-up done including echo many yrs ago  (10-07-2021  pt stated pcp at times hears murmur but a symptomatic)   Perianal lesion     Past Surgical History:  Procedure Laterality Date   APPENDECTOMY  1990   BIOPSY  09/03/2018   Procedure: BIOPSY;  Surgeon: Jackquline Denmark, MD;  Location: [facility];  Service: Endoscopy;;   COLONOSCOPY WITH ESOPHAGOGASTRODUODENOSCOPY (EGD)  08/31/2021   by dr Ardis Hughs   ELBOW SURGERY Right 1987   ESOPHAGOGASTRODUODENOSCOPY  08/18/2011   Procedure: ESOPHAGOGASTRODUODENOSCOPY (EGD);  Surgeon: Jerene Bears, MD;  Location: [facility];  Service: Gastroenterology;  Laterality: N/A;   ESOPHAGOGASTRODUODENOSCOPY  11/11/2011   Procedure: ESOPHAGOGASTRODUODENOSCOPY (EGD);  Surgeon: Gatha Mayer, MD;  Location: [facility];  Service: Endoscopy;  Laterality: N/A;   ESOPHAGOGASTRODUODENOSCOPY (EGD) WITH PROPOFOL  N/A 09/03/2018   Procedure: ESOPHAGOGASTRODUODENOSCOPY (EGD) WITH PROPOFOL;  Surgeon: Jackquline Denmark, MD;  Location: [facility];  Service: Endoscopy;  Laterality: N/A;   ESOPHAGOGASTRODUODENOSCOPY (EGD) WITH PROPOFOL N/A 11/24/2021   Procedure: ESOPHAGOGASTRODUODENOSCOPY (EGD) WITH PROPOFOL;  Surgeon: Irene Shipper, MD;  Location: [facility];  Service: Gastroenterology;  Laterality: N/A;   KNEE ARTHROSCOPY Right 02/26/2009   '@[facility]'$  by dr c. blackman   MASS EXCISION N/A 10/15/2021   Procedure: EXCISION OF PERIANAL MASS;  Surgeon: Ileana Roup, MD;  Location: [place];  Service: General;  Laterality: N/A;   MULTIPLE TOOTH EXTRACTIONS     several ---  w/ IV sedation  last extraction 05/ 2023   RECTAL EXAM UNDER ANESTHESIA N/A 10/15/2021   Procedure: ANORECTAL EXAM UNDER ANESTHESIA;  Surgeon: Ileana Roup, MD;  Location: [place];  Service: General;  Laterality: N/A;   SHOULDER ARTHROSCOPY W/ ROTATOR CUFF REPAIR Right 2012   SKIN BIOPSY N/A 10/15/2021   Procedure: BIOPSY OF GLUTEAL SKIN;  Surgeon: Ileana Roup, MD;  Location: [place];  Service: General;  Laterality: N/A;    Social History [place]  reports that he has never smoked. He has never used smokeless tobacco. He reports that he does not drink alcohol and does not use drugs.  family history includes Asthma in his father; Colon polyps in his father; Diabetes in his mother; Heart failure in his father; Hypertension in his father and mother;  Migraines in his mother; Prostate cancer in his father; Stroke in his father and mother.  Allergies  Allergen Reactions   Codeine Other (See Comments)    Hallucinations   Hydrocodone-Acetaminophen Other (See Comments)    Hallucinations   Food     Pepperoni-nausea/vomiting Lettuce-nausea/vomiting Oranges-nausea/vomiting Cheese-nausea/vomiting   Ibuprofen     "ulcers in my stomach"   Compazine [Prochlorperazine  Edisylate] Anxiety       PHYSICAL EXAMINATION: Vital signs: BP 125/75   Pulse (!) 58   Temp (!) 97.3 F (36.3 C)   Ht '5\' 7"'$  (1.702 m)   Wt 180 lb (81.6 kg)   SpO2 99%   BMI 28.19 kg/m  General: Well-developed, well-nourished, no acute distress HEENT: Sclerae are anicteric, conjunctiva pink. Oral mucosa intact Lungs: Clear Heart: Regular Abdomen: soft, nontender, nondistended, no obvious ascites, no peritoneal signs, normal bowel sounds. No organomegaly. Extremities: No edema Psychiatric: alert and oriented x3. Cooperative     ASSESSMENT:  1.  GERD 2.  Dysphagia with transient food impaction secondary to known peptic stricture   PLAN: 1.  EGD with esophageal dilation 2.  Continue PPI

## 2022-05-20 NOTE — Progress Notes (Signed)
A and O x3. Report to RN. Tolerated MAC anesthesia well.Teeth unchanged after procedure. 

## 2022-05-23 ENCOUNTER — Telehealth: Payer: Self-pay | Admitting: *Deleted

## 2022-05-23 NOTE — Telephone Encounter (Signed)
  Follow up Call-     05/20/2022   10:00 AM 09/01/2021    2:44 PM 05/11/2021    9:03 AM  Call back number  Post procedure Call Back phone  # [phone]  Permission to leave phone message Yes Yes Yes     Patient questions:  Do you have a fever, pain , or abdominal swelling? No. Pain Score  0 *  Have you tolerated food without any problems? Yes.    Have you been able to return to your normal activities? Yes.    Do you have any questions about your discharge instructions: Diet   No. Medications  No. Follow up visit  No.  Do you have questions or concerns about your Care? No.  Actions: * If pain score is 4 or above: No action needed, pain <4.

## 2022-06-28 ENCOUNTER — Ambulatory Visit: Payer: BC Managed Care – PPO | Admitting: Internal Medicine

## 2022-08-04 ENCOUNTER — Emergency Department: Payer: BC Managed Care – PPO

## 2022-08-04 ENCOUNTER — Observation Stay: Payer: BC Managed Care – PPO

## 2022-08-04 ENCOUNTER — Other Ambulatory Visit: Payer: Self-pay

## 2022-08-04 ENCOUNTER — Observation Stay
Admission: EM | Admit: 2022-08-04 | Discharge: 2022-08-05 | Disposition: A | Discharge status: Home / Self Care | Payer: BC Managed Care – PPO | Attending: Internal Medicine | Admitting: Internal Medicine

## 2022-08-04 DIAGNOSIS — R55 Syncope and collapse: Principal | ICD-10-CM | POA: Insufficient documentation

## 2022-08-04 DIAGNOSIS — R61 Generalized hyperhidrosis: Secondary | ICD-10-CM

## 2022-08-04 DIAGNOSIS — R202 Paresthesia of skin: Secondary | ICD-10-CM | POA: Diagnosis not present

## 2022-08-04 DIAGNOSIS — E860 Dehydration: Secondary | ICD-10-CM | POA: Diagnosis not present

## 2022-08-04 DIAGNOSIS — K219 Gastro-esophageal reflux disease without esophagitis: Secondary | ICD-10-CM | POA: Diagnosis not present

## 2022-08-04 DIAGNOSIS — R7989 Other specified abnormal findings of blood chemistry: Secondary | ICD-10-CM | POA: Insufficient documentation

## 2022-08-04 DIAGNOSIS — R404 Transient alteration of awareness: Secondary | ICD-10-CM | POA: Diagnosis not present

## 2022-08-04 DIAGNOSIS — I499 Cardiac arrhythmia, unspecified: Secondary | ICD-10-CM | POA: Diagnosis not present

## 2022-08-04 DIAGNOSIS — I10 Essential (primary) hypertension: Secondary | ICD-10-CM | POA: Diagnosis not present

## 2022-08-04 DIAGNOSIS — Z743 Need for continuous supervision: Secondary | ICD-10-CM | POA: Diagnosis not present

## 2022-08-04 LAB — URINALYSIS, ROUTINE W REFLEX MICROSCOPIC
Bilirubin Urine: NEGATIVE
Glucose, UA: NEGATIVE mg/dL
Hgb urine dipstick: NEGATIVE
Ketones, ur: NEGATIVE mg/dL
Leukocytes,Ua: NEGATIVE
Nitrite: NEGATIVE
Protein, ur: NEGATIVE mg/dL
Specific Gravity, Urine: 1.02 (ref 1.005–1.030)
pH: 6 (ref 5.0–8.0)

## 2022-08-04 LAB — CBC
HCT: 47.7 % (ref 39.0–52.0)
Hemoglobin: 16.7 g/dL (ref 13.0–17.0)
MCH: 29.1 pg (ref 26.0–34.0)
MCHC: 35 g/dL (ref 30.0–36.0)
MCV: 83.2 fL (ref 80.0–100.0)
Platelets: 267 10*3/uL (ref 150–400)
RBC: 5.73 MIL/uL (ref 4.22–5.81)
RDW: 12.9 % (ref 11.5–15.5)
WBC: 8.7 10*3/uL (ref 4.0–10.5)
nRBC: 0 % (ref 0.0–0.2)

## 2022-08-04 LAB — HIV ANTIBODY (ROUTINE TESTING W REFLEX): HIV Screen 4th Generation wRfx: NONREACTIVE

## 2022-08-04 LAB — CBG MONITORING, ED: Glucose-Capillary: 100 mg/dL — ABNORMAL HIGH (ref 70–99)

## 2022-08-04 LAB — BASIC METABOLIC PANEL
Anion gap: 8 (ref 5–15)
BUN: 9 mg/dL (ref 6–20)
CO2: 24 mmol/L (ref 22–32)
Calcium: 8.6 mg/dL — ABNORMAL LOW (ref 8.9–10.3)
Chloride: 105 mmol/L (ref 98–111)
Creatinine, Ser: 1.13 mg/dL (ref 0.61–1.24)
GFR, Estimated: >60 mL/min (ref >60)
Glucose, Bld: 107 mg/dL — ABNORMAL HIGH (ref 70–99)
Potassium: 3.7 mmol/L (ref 3.5–5.1)
Sodium: 137 mmol/L (ref 135–145)

## 2022-08-04 LAB — TROPONIN I (HIGH SENSITIVITY)
Troponin I (High Sensitivity): 7 ng/L (ref <18)
Troponin I (High Sensitivity): 18 ng/L — ABNORMAL HIGH (ref <18)
Troponin I (High Sensitivity): 15 ng/L (ref <18)
Troponin I (High Sensitivity): 14 ng/L (ref <18)

## 2022-08-04 MED ORDER — PANTOPRAZOLE SODIUM 40 MG PO TBEC: 40.0000 mg | DELAYED_RELEASE_TABLET | Freq: Every day | ORAL | Status: DC

## 2022-08-04 MED ORDER — ACETAMINOPHEN 325 MG PO TABS
650.0000 mg | ORAL_TABLET | ORAL | Status: DC | PRN
  Administered 2022-08-04: 650 mg via ORAL
  Filled 2022-08-04: qty 2

## 2022-08-04 MED ORDER — ASPIRIN 81 MG PO CHEW
324.0000 mg | CHEWABLE_TABLET | Freq: Once | ORAL | Status: AC
  Administered 2022-08-04: 324 mg via ORAL
  Filled 2022-08-04: qty 4

## 2022-08-04 MED ORDER — IOHEXOL 350 MG/ML SOLN
75.0000 mL | Freq: Once | INTRAVENOUS | Status: AC | PRN
  Administered 2022-08-04: 75 mL via INTRAVENOUS

## 2022-08-04 MED ORDER — SODIUM CHLORIDE 0.9 % IV BOLUS
500.0000 mL | Freq: Once | INTRAVENOUS | Status: AC
  Administered 2022-08-04: 500 mL via INTRAVENOUS

## 2022-08-04 MED ORDER — ONDANSETRON HCL 4 MG/2ML IJ SOLN: 4.0000 mg | Freq: Four times a day (QID) | INTRAMUSCULAR | Status: DC | PRN

## 2022-08-04 MED ORDER — ENOXAPARIN SODIUM 40 MG/0.4ML IJ SOSY: 40.0000 mg | PREFILLED_SYRINGE | INTRAMUSCULAR | Status: DC

## 2022-08-04 MED ORDER — ONDANSETRON HCL 4 MG/2ML IJ SOLN
4.0000 mg | INTRAMUSCULAR | Status: AC
  Administered 2022-08-04: 4 mg via INTRAVENOUS
  Filled 2022-08-04: qty 2

## 2022-08-04 MED ORDER — NITROGLYCERIN 2 % TD OINT: 1.0000 [in_us] | TOPICAL_OINTMENT | TRANSDERMAL | Status: DC

## 2022-08-04 MED ORDER — MORPHINE SULFATE (PF) 4 MG/ML IV SOLN
4.0000 mg | Freq: Once | INTRAVENOUS | Status: AC
  Administered 2022-08-04: 4 mg via INTRAVENOUS
  Filled 2022-08-04: qty 1

## 2022-08-04 MED ORDER — LORAZEPAM 0.5 MG PO TABS
0.5000 mg | ORAL_TABLET | Freq: Once | ORAL | Status: AC | PRN
  Administered 2022-08-04: 0.5 mg via ORAL
  Filled 2022-08-04: qty 1

## 2022-08-04 MED ORDER — ASPIRIN 81 MG PO TBEC
81.0000 mg | DELAYED_RELEASE_TABLET | Freq: Every day | ORAL | Status: DC
  Administered 2022-08-05: 81 mg via ORAL
  Filled 2022-08-04: qty 1

## 2022-08-04 NOTE — ED Provider Notes (Signed)
[facility] Provider Note    Event Date/Time   First MD Initiated Contact with Patient 08/04/22 1138     (approximate)   History   Weakness and Blurred Vision   HPI  Bobby Mcbride is a 52 y.o. 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  The entered into the training center and he started feeling very lightheaded short of breath and developed a strange discomfort across his upper back.  He also reports a sharp radiating pain in his left arm, but advises the left arm has been having pain radiation and tingling off and on for at least 2 weeks.  He denies any heavy obvious discrete chest pain but rather reports an arm pain.  Denies personal history of heart disease, does not smoke, does work as a IT sales professional, has a strong family history of coronary disease  At present he reports a rather sharp shooting pain in his left arm      Physical Exam   Triage Vital Signs: ED Triage Vitals  Enc Vitals Group     BP 08/04/22 1126 (!) 149/79     Pulse Rate 08/04/22 1126 88     Resp 08/04/22 1126 13     Temp 08/04/22 1126 98.4 F (36.9 C)     Temp Source 08/04/22 1126 Oral     SpO2 08/04/22 1126 100 %     Weight 08/04/22 1127 175 lb (79.4 kg)     Height 08/04/22 1127 5\' 7"  (1.702 m)     Head Circumference --      Peak Flow --      Pain Score 08/04/22 1126 8     Pain Loc --      Pain Edu? --      Excl. in GC? --     Most recent vital signs: Vitals:   08/04/22 1228 08/04/22 1500  BP:  (!) 138/93  Pulse: 83 64  Resp: 14 14  Temp:  97.6 F (36.4 C)  SpO2: 100% 99%     General: Awake, no distress.  Normocephalic atraumaticCV:  Good peripheral perfusion.  Normal tone and rate Resp:  Normal effort.  Clear bilateralAbd:  No distention.  Soft nontender  nondistended Other:  No lower extremity venous congestion or swelling   ED Results / Procedures / Treatments   Labs (all labs ordered are listed, but only abnormal results are displayed) Labs Reviewed  BASIC METABOLIC PANEL - Abnormal; Notable for the following components:      Result Value   Glucose, Bld 107 (*)    Calcium 8.6 (*)    All other components within normal limits  URINALYSIS, ROUTINE W REFLEX MICROSCOPIC - Abnormal; Notable for the following components:   Color, Urine STRAW (*)    APPearance CLEAR (*)    All other components within normal limits  CBG MONITORING, ED - Abnormal; Notable for the following components:   Glucose-Capillary 100 (*)    All other components within normal limits  TROPONIN I (HIGH SENSITIVITY) - Abnormal; Notable for the following components:   Troponin I (High Sensitivity) 18 (*)    All other components within normal limits  CBC  HIV ANTIBODY (ROUTINE TESTING W REFLEX)  TROPONIN I (HIGH SENSITIVITY)     EKG  EKG interpreted  at 1124 heart rate 90 QRS 90 QTc 430 Normal sinus rhythm no evidence of acute ischemia.  Repeat EKG interpreted at 1220 heart rate 85 QRS 90 QTc 430 Normal sinus rhythm again no noted acute ischemic changes   RADIOLOGY   CT Head Wo Contrast  Result Date: 08/04/2022 CLINICAL DATA:  Left arm pain and paresthesias for the past 2 weeks. EXAM: CT HEAD WITHOUT CONTRAST CT CERVICAL SPINE WITHOUT CONTRAST TECHNIQUE: Multidetector CT imaging of the head and cervical spine was performed following the standard protocol without intravenous contrast. Multiplanar CT image reconstructions of the cervical spine were also generated. RADIATION DOSE REDUCTION: This exam was performed according to the departmental dose-optimization program which includes automated exposure control, adjustment of the mA and/or kV according to patient size and/or use of iterative reconstruction technique. COMPARISON:  CT neck dated June 28, 2021. CT head  dated Aug 12, 2011. FINDINGS: CT HEAD FINDINGS Brain: No evidence of acute infarction, hemorrhage, hydrocephalus, extra-axial collection or mass lesion/mass effect. Vascular: No hyperdense vessel or unexpected calcification. Skull: Normal. Negative for fracture or focal lesion. Sinuses/Orbits: No acute finding. Other: None. CT CERVICAL SPINE FINDINGS Alignment: Unchanged reversal of the normal cervical lordosis. No significant listhesis. Skull base and vertebrae: No acute fracture. No primary bone lesion or focal pathologic process. 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 Soft tissues and spinal canal: No prevertebral fluid or swelling. No visible canal hematoma. Disc levels: Unchanged severe disc height loss and moderate uncovertebral hypertrophy at C4-C5. Unchanged mild-to-moderate disc height loss at C5-C6 and C6-C7 with moderate uncovertebral hypertrophy on the left at C5-C6 and on the right at C6-C7. Mild left neuroforaminal stenosis at C4-C5 and C5-C6. Moderate right neuroforaminal stenosis at C6-C7. No high-grade spinal canal stenosis. Upper chest: Biapical pleuroparenchymal scarring. Other: None. IMPRESSION: 1. No acute intracranial abnormality. 2. No acute cervical spine fracture or traumatic listhesis. 3. 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 Electronically Signed   By: Obie Dredge M.D.   On: 08/04/2022 13:17   CT Cervical Spine Wo Contrast  Result Date: 08/04/2022 CLINICAL DATA:  Left arm pain and paresthesias for the past 2 weeks. EXAM: CT HEAD WITHOUT CONTRAST CT CERVICAL SPINE WITHOUT CONTRAST TECHNIQUE: Multidetector CT imaging of the head and cervical spine was performed following the standard protocol without intravenous contrast. Multiplanar CT image reconstructions of the cervical spine were also generated. RADIATION DOSE REDUCTION: This exam was performed according to the departmental dose-optimization program which includes automated exposure  control, adjustment of the mA and/or kV according to patient size and/or use of iterative reconstruction technique. COMPARISON:  CT neck dated June 28, 2021. CT head dated Aug 12, 2011. FINDINGS: CT HEAD FINDINGS Brain: No evidence of acute infarction, hemorrhage, hydrocephalus, extra-axial collection or mass lesion/mass effect. Vascular: No hyperdense vessel or unexpected calcification. Skull: Normal. Negative for fracture or focal lesion. Sinuses/Orbits: No acute finding. Other: None. CT CERVICAL SPINE FINDINGS Alignment: Unchanged reversal of the normal cervical lordosis. No significant listhesis. Skull base and vertebrae: No acute fracture. No primary bone lesion or focal pathologic process. 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 Soft tissues and spinal canal: No prevertebral fluid or swelling. No visible canal hematoma. Disc levels: Unchanged severe disc height loss and moderate uncovertebral hypertrophy at C4-C5. Unchanged mild-to-moderate disc height loss at C5-C6 and C6-C7 with moderate uncovertebral hypertrophy on the left at C5-C6 and on the right at C6-C7. Mild left neuroforaminal stenosis at C4-C5 and C5-C6. Moderate right neuroforaminal stenosis at C6-C7. No high-grade  spinal canal stenosis. Upper chest: Biapical pleuroparenchymal scarring. Other: None. IMPRESSION: 1. No acute intracranial abnormality. 2. No acute cervical spine fracture or traumatic listhesis. 3. 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 Electronically Signed   By: Obie Dredge M.D.   On: 08/04/2022 13:17   CT Angio Chest Aorta W and/or Wo Contrast  Result Date: 08/04/2022 CLINICAL DATA:  Chest and back pain with near-syncope today. Two weeks of left arm pain and paresthesias. EXAM: CT ANGIOGRAPHY CHEST WITH CONTRAST TECHNIQUE: Multidetector CT imaging of the chest was performed using the standard protocol during bolus administration of intravenous contrast. Multiplanar CT image  reconstructions and MIPs were obtained to evaluate the vascular anatomy. RADIATION DOSE REDUCTION: This exam was performed according to the departmental dose-optimization program which includes automated exposure control, adjustment of the mA and/or kV according to patient size and/or use of iterative reconstruction technique. CONTRAST:  75mL OMNIPAQUE IOHEXOL 350 MG/ML SOLN COMPARISON:  Chest x-ray dated Aug 07, 2021. FINDINGS: Cardiovascular: Preferential opacification of the thoracic aorta. No evidence of thoracic aortic aneurysm or dissection. Normal heart size. No pericardial effusion. No pulmonary embolism. Mediastinum/Nodes: No enlarged mediastinal, hilar, or axillary lymph nodes. Thyroid gland, trachea, and esophagus demonstrate no significant findings. Lungs/Pleura: No focal consolidation, pleural effusion, or pneumothorax. Upper Abdomen: No acute abnormality. Musculoskeletal: No chest wall abnormality. No acute or significant osseous findings. 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 Review of the MIP images confirms the above findings. IMPRESSION: 1. No evidence of thoracic aortic aneurysm or dissection. No acute intrathoracic process. Electronically Signed   By: Obie Dredge M.D.   On: 08/04/2022 13:09      PROCEDURES:  Critical Care performed: No  Procedures   MEDICATIONS ORDERED IN ED: Medications  LORazepam (ATIVAN) tablet 0.5 mg (has no administration in time range)  acetaminophen (TYLENOL) tablet 650 mg (650 mg Oral Given 08/04/22 1527)  ondansetron (ZOFRAN) injection 4 mg (has no administration in time range)  enoxaparin (LOVENOX) injection 40 mg (has no administration in time range)  aspirin EC tablet 81 mg (has no administration in time range)  pantoprazole (PROTONIX) EC tablet 40 mg (has no administration in time range)  sodium chloride 0.9 % bolus 500 mL (0 mLs Intravenous Stopped 08/04/22 1402)  morphine (PF) 4 MG/ML injection 4 mg (4 mg  Intravenous Given 08/04/22 1221)  ondansetron (ZOFRAN) injection 4 mg (4 mg Intravenous Given 08/04/22 1219)  aspirin chewable tablet 324 mg (324 mg Oral Given 08/04/22 1219)  iohexol (OMNIPAQUE) 350 MG/ML injection 75 mL (75 mLs Intravenous Contrast Given 08/04/22 1235)  morphine (PF) 4 MG/ML injection 4 mg (4 mg Intravenous Given 08/04/22 1423)     IMPRESSION / MDM / ASSESSMENT AND PLAN / ED COURSE  I reviewed the triage vital signs and the nursing notes.                              Differential diagnosis includes, but is not limited to, possible presyncope, heat exposure, dehydration, angina, arrhythmia, less likely but given associated back pain arm pain causes such as dissection or thromboembolism.  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  Differential also remains broad with other causes we will test with CBC metabolic panel troponins CT imaging he moves all extremities well speech is clear his facial expressions normal.  Patient's presentation is most consistent with acute complicated illness / injury requiring diagnostic workup.   The patient is on the cardiac monitor to evaluate for evidence of arrhythmia and/or significant heart rate changes.   Despite morphine patient reports ongoing sharpness in his left arm from about the shoulder down to the hand.  Motor sensor intact good use of the left hand strong radial pulse warm well-perfused no swelling no evidence of edema in the extremity.  Unclear if this is actually related to his symptoms today or perhaps could be representative of a different process such as carpal tunnel radiculopathy etc.  Patient's troponins trended his second troponin slight  uptrending.  Very minimal rise but still somewhat positive on delta troponin.  Will admit to hospitalist service.  Patient understand agreeable plan for admission, and consulted with and patient accepted to hospital service by Dr. Alvester Morin     FINAL CLINICAL IMPRESSION(S) / ED DIAGNOSES   Final diagnoses:  Paresthesia  Elevated troponin I level  Near syncope     Rx / DC Orders   ED Discharge Orders     None        Note:  This document was prepared using Dragon voice recognition software and may include unintentional dictation errors.   Sharyn Creamer, MD 08/04/22 1539
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
## 2022-08-04 NOTE — H&P (Addendum)
History and Physical    Patient: Bobby Mcbride DOB: [birth year] DOA: 08/04/2022 DOS: the patient was seen and examined on 08/04/2022 PCP: Eden Emms, NP  Patient coming from: Home  Chief Complaint:  Chief Complaint  Patient presents with   Weakness   Blurred Vision   HPI: Bobby Mcbride is a 52 y.o. male with medical history significant of GERD and hypertension presenting with near syncope.  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  Patient reports waking up this morning with left arm numbness and pain.  No overt chest pain chest pressure. Noted history of mitral valve prolapse. No reported orthopnea or PND.  Denies any prior history of cardiac issues in the past.  Positive family history of cardiac disease in both his mother and father.  Denies any prior episodes like this in the past.  No slurred speech or hemiparesis.  Had episode of confusion with episode occurred earlier in the day.  He did not know who his boss was per report. Presented to the ER afebrile, hemodynamically stable.  Satting well on room air.  White count 8.7, hemoglobin 16.7, platelets 267, urinalysis grossly stable, troponin 7-->18.  CT head, CT C-spine, CT angio of the chest are grossly stable.  EKG with sinus rhythm and nonspecific ST changes. Review of Systems: As mentioned in the history of present illness. All other systems reviewed and are negative. 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   hospital admission lower GI bleed,  per EGD small non-bleeding mallory-weiss tear (no intervention) excessive nsaid use   Migraine with aura    Mitral valve prolapse    per pt dx age 57 had work-up done including echo many yrs ago  (10-07-2021  pt stated pcp at times hears murmur but a symptomatic)   Perianal lesion    Past Surgical History:  Procedure Laterality Date   APPENDECTOMY  1990   BIOPSY  09/03/2018   Procedure: BIOPSY;  Surgeon: Lynann Bologna, MD;  Location: [facility];  Service: Endoscopy;;   COLONOSCOPY WITH ESOPHAGOGASTRODUODENOSCOPY (EGD)  08/31/2021   by dr Christella Hartigan   ELBOW SURGERY Right 1987   ESOPHAGOGASTRODUODENOSCOPY  08/18/2011   Procedure: ESOPHAGOGASTRODUODENOSCOPY (EGD);  Surgeon: Beverley Fiedler, MD;  Location: [facility];  Service: Gastroenterology;  Laterality: N/A;   ESOPHAGOGASTRODUODENOSCOPY  11/11/2011   Procedure: ESOPHAGOGASTRODUODENOSCOPY (EGD);  Surgeon: Iva Boop, MD;  Location: [facility];  Service: Endoscopy;  Laterality: N/A;   ESOPHAGOGASTRODUODENOSCOPY (EGD) WITH PROPOFOL N/A 09/03/2018   Procedure: ESOPHAGOGASTRODUODENOSCOPY (EGD) WITH PROPOFOL;  Surgeon: Lynann Bologna, MD;  Location: [facility];  Service: Endoscopy;  Laterality: N/A;   ESOPHAGOGASTRODUODENOSCOPY (EGD) WITH PROPOFOL N/A 11/24/2021   Procedure: ESOPHAGOGASTRODUODENOSCOPY (EGD) WITH PROPOFOL;  Surgeon: Hilarie Fredrickson, MD;  Location: [facility];  Service: Gastroenterology;  Laterality: N/A;   KNEE ARTHROSCOPY Right 02/26/2009   @[facility]  by dr c. Magnus Ivan  MASS EXCISION N/A 10/15/2021   Procedure: EXCISION OF PERIANAL MASS;  Surgeon: Andria Meuse, MD;  Location: [facility];  Service: General;  Laterality: N/A;   MULTIPLE TOOTH EXTRACTIONS     several ---  w/ IV sedation  last extraction 05/ 2023   RECTAL EXAM UNDER ANESTHESIA N/A 10/15/2021   Procedure: ANORECTAL EXAM UNDER ANESTHESIA;  Surgeon: Andria Meuse, MD;   Location: [facility];  Service: General;  Laterality: N/A;   SHOULDER ARTHROSCOPY W/ ROTATOR CUFF REPAIR Right 2012   SKIN BIOPSY N/A 10/15/2021   Procedure: BIOPSY OF GLUTEAL SKIN;  Surgeon: Andria Meuse, MD;  Location: [facility];  Service: General;  Laterality: N/A;   Social History:  reports that he has never smoked. He has never used smokeless tobacco. He reports that he does not drink alcohol and does not use drugs.  Allergies  Allergen Reactions   Codeine Other (See Comments)    Hallucinations   Hydrocodone-Acetaminophen Other (See Comments)    Hallucinations   Food     Pepperoni-nausea/vomiting Lettuce-nausea/vomiting Oranges-nausea/vomiting Cheese-nausea/vomiting   Ibuprofen     "ulcers in my stomach"   Compazine [Prochlorperazine Edisylate] Anxiety    Family History  Problem Relation Age of Onset   Hypertension Mother    Stroke Mother    Migraines Mother    Diabetes Mother    Prostate cancer Father    Hypertension Father    Stroke Father    Heart failure Father    Asthma Father    Colon polyps Father    Colon cancer Neg Hx    Esophageal cancer Neg Hx    Stomach cancer Neg Hx    Rectal cancer Neg Hx     Prior to Admission medications   Medication Sig Start Date End Date Taking? Authorizing Provider  benzonatate (TESSALON) 100 MG capsule Take 1 capsule (100 mg total) by mouth every 8 (eight) hours. Patient not taking: Reported on 11/24/2021 08/07/21   Cheryll Cockayne, MD  fluticasone [facility]) 50 MCG/ACT nasal spray Place 2 sprays into both nostrils daily. Patient not taking: Reported on 11/24/2021 08/06/21   Eden Emms, NP  hydrocortisone (ANUSOL-HC) 2.5 % rectal cream Apply 1 Application topically daily as needed for hemorrhoids. 11/01/21   [provider]  lidocaine (XYLOCAINE) 2 % solution Swallow 10 ml every 4-6 hours as needed for pain with swallowing Patient not taking: Reported on 11/24/2021 06/28/21    Sherian Maroon A, PA  methylcellulose (CITRUCEL) oral powder Take 1 packet by mouth daily. Patient not taking: Reported on 11/24/2021 08/03/21   Rachael Fee, MD  ondansetron (ZOFRAN) 4 MG tablet Take 1 tablet (4 mg total) by mouth 2 (two) times daily as needed for nausea or vomiting. Patient not taking: Reported on 11/24/2021 08/03/21   Rachael Fee, MD  oxyCODONE-acetaminophen (PERCOCET/ROXICET) 5-325 MG tablet Take 1 tablet by mouth every 6 (six) hours as needed for up to 4 doses for severe pain. Patient not taking: Reported on 11/24/2021 08/07/21   Cheryll Cockayne, MD  pantoprazole (PROTONIX) 40 MG tablet Take 1 tablet (40 mg total) by mouth daily. 05/16/22   Hilarie Fredrickson, MD    Physical Exam: Vitals:   08/04/22 1200 08/04/22 1215 08/04/22 1228 08/04/22 1500  BP: (!) 141/92   (!) 138/93  Pulse: 85 85 83 64  Resp: 18 (!) 24 14 14   Temp:    97.6 F (36.4 C)  TempSrc:  Oral  SpO2: 99% 97% 100% 99%  Weight:      Height:       Physical Exam Constitutional:      Appearance: He is normal weight.  HENT:     Head: Normocephalic and atraumatic.     Nose: Nose normal.     Mouth/Throat:     Mouth: Mucous membranes are moist.  Eyes:     Pupils: Pupils are equal, round, and reactive to light.  Cardiovascular:     Rate and Rhythm: Normal rate and regular rhythm.  Pulmonary:     Effort: Pulmonary effort is normal.  Abdominal:     General: Bowel sounds are normal.  Musculoskeletal:        General: Normal range of motion.     Cervical back: Normal range of motion.  Skin:    General: Skin is warm.  Neurological:     General: No focal deficit present.  Psychiatric:        Mood and Affect: Mood normal.     Data Reviewed:  There are no new results to review at this time. CT Cervical Spine Wo Contrast CLINICAL DATA:  Left arm pain and paresthesias for the past 2 weeks.  EXAM: CT HEAD WITHOUT CONTRAST  CT CERVICAL SPINE WITHOUT CONTRAST  TECHNIQUE: Multidetector CT  imaging of the head and cervical spine was performed following the standard protocol without intravenous contrast. Multiplanar CT image reconstructions of the cervical spine were also generated.  RADIATION DOSE REDUCTION: This exam was performed according to the departmental dose-optimization program which includes automated exposure control, adjustment of the mA and/or kV according to patient size and/or use of iterative reconstruction technique.  COMPARISON:  CT neck dated June 28, 2021. CT head dated Aug 12, 2011.  FINDINGS: CT HEAD FINDINGS  Brain: No evidence of acute infarction, hemorrhage, hydrocephalus, extra-axial collection or mass lesion/mass effect.  Vascular: No hyperdense vessel or unexpected calcification.  Skull: Normal. Negative for fracture or focal lesion.  Sinuses/Orbits: No acute finding.  Other: None.  CT CERVICAL SPINE FINDINGS  Alignment: Unchanged reversal of the normal cervical lordosis. No significant listhesis.  Skull base and vertebrae: No acute fracture. No primary bone lesion or focal pathologic process. 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  Soft tissues and spinal canal: No prevertebral fluid or swelling. No visible canal hematoma.  Disc levels: Unchanged severe disc height loss and moderate uncovertebral hypertrophy at C4-C5. Unchanged mild-to-moderate disc height loss at C5-C6 and C6-C7 with moderate uncovertebral hypertrophy on the left at C5-C6 and on the right at C6-C7. Mild left neuroforaminal stenosis at C4-C5 and C5-C6. Moderate right neuroforaminal stenosis at C6-C7. No high-grade spinal canal stenosis.  Upper chest: Biapical pleuroparenchymal scarring.  Other: None.  IMPRESSION: 1. No acute intracranial abnormality. 2. No acute cervical spine fracture or traumatic listhesis. 3. 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  Electronically Signed   By: Obie Dredge M.D.   On:  08/04/2022 13:17 CT Head Wo Contrast CLINICAL DATA:  Left arm pain and paresthesias for the past 2 weeks.  EXAM: CT HEAD WITHOUT CONTRAST  CT CERVICAL SPINE WITHOUT CONTRAST  TECHNIQUE: Multidetector CT imaging of the head and cervical spine was performed following the standard protocol without intravenous contrast. Multiplanar CT image reconstructions of the cervical spine were also generated.  RADIATION DOSE REDUCTION: This exam was performed according to the departmental dose-optimization program which includes automated exposure control, adjustment of the mA and/or kV according to patient size  and/or use of iterative reconstruction technique.  COMPARISON:  CT neck dated June 28, 2021. CT head dated Aug 12, 2011.  FINDINGS: CT HEAD FINDINGS  Brain: No evidence of acute infarction, hemorrhage, hydrocephalus, extra-axial collection or mass lesion/mass effect.  Vascular: No hyperdense vessel or unexpected calcification.  Skull: Normal. Negative for fracture or focal lesion.  Sinuses/Orbits: No acute finding.  Other: None.  CT CERVICAL SPINE FINDINGS  Alignment: Unchanged reversal of the normal cervical lordosis. No significant listhesis.  Skull base and vertebrae: No acute fracture. No primary bone lesion or focal pathologic process. 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  Soft tissues and spinal canal: No prevertebral fluid or swelling. No visible canal hematoma.  Disc levels: Unchanged severe disc height loss and moderate uncovertebral hypertrophy at C4-C5. Unchanged mild-to-moderate disc height loss at C5-C6 and C6-C7 with moderate uncovertebral hypertrophy on the left at C5-C6 and on the right at C6-C7. Mild left neuroforaminal stenosis at C4-C5 and C5-C6. Moderate right neuroforaminal stenosis at C6-C7. No high-grade spinal canal stenosis.  Upper chest: Biapical pleuroparenchymal scarring.  Other: None.  IMPRESSION: 1. No acute intracranial  abnormality. 2. No acute cervical spine fracture or traumatic listhesis. 3. 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  Electronically Signed   By: Obie Dredge M.D.   On: 08/04/2022 13:17 CT Angio Chest Aorta W and/or Wo Contrast CLINICAL DATA:  Chest and back pain with near-syncope today. Two weeks of left arm pain and paresthesias.  EXAM: CT ANGIOGRAPHY CHEST WITH CONTRAST  TECHNIQUE: Multidetector CT imaging of the chest was performed using the standard protocol during bolus administration of intravenous contrast. Multiplanar CT image reconstructions and MIPs were obtained to evaluate the vascular anatomy.  RADIATION DOSE REDUCTION: This exam was performed according to the departmental dose-optimization program which includes automated exposure control, adjustment of the mA and/or kV according to patient size and/or use of iterative reconstruction technique.  CONTRAST:  75mL OMNIPAQUE IOHEXOL 350 MG/ML SOLN  COMPARISON:  Chest x-ray dated Aug 07, 2021.  FINDINGS: Cardiovascular: Preferential opacification of the thoracic aorta. No evidence of thoracic aortic aneurysm or dissection. Normal heart size. No pericardial effusion. No pulmonary embolism.  Mediastinum/Nodes: No enlarged mediastinal, hilar, or axillary lymph nodes. Thyroid gland, trachea, and esophagus demonstrate no significant findings.  Lungs/Pleura: No focal consolidation, pleural effusion, or pneumothorax.  Upper Abdomen: No acute abnormality.  Musculoskeletal: No chest wall abnormality. No acute or significant osseous findings. 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  Review of the MIP images confirms the above findings.  IMPRESSION: 1. No evidence of thoracic aortic aneurysm or dissection. No acute intrathoracic process.  Electronically Signed   By: Obie Dredge M.D.   On: 08/04/2022 13:09  Lab Results   Component Value Date   WBC 8.7 08/04/2022   HGB 16.7 08/04/2022   HCT 47.7 08/04/2022   MCV 83.2 08/04/2022   PLT 267 08/04/2022   Last metabolic panel Lab Results  Component Value Date   GLUCOSE 107 (H) 08/04/2022   NA 137 08/04/2022   K 3.7 08/04/2022   CL 105 08/04/2022   CO2 24 08/04/2022   BUN 9 08/04/2022   CREATININE 1.13 08/04/2022   GFRNONAA >60 08/04/2022   CALCIUM 8.6 (L) 08/04/2022   PROT 7.5 08/03/2021   ALBUMIN 4.7 08/03/2021   BILITOT 0.7 08/03/2021   ALKPHOS 68 08/03/2021   AST 28 08/03/2021   ALT 49 08/03/2021   ANIONGAP 8 08/04/2022  Assessment and Plan: * Near syncope 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 EKG normal sinus rhythm with some nonspecific changes Troponin with albeit small uptrend from 7-18 Heart score around 5 Status post full dose aspirin in the ER Will plan for formal cardiology consult as patient may likely benefit from stress test MRI of the brain to better assess-neurology consult if there is any changes indicative of CVA event Fairly nonfocal neuroexam 2D echo Follow-up formal cardiology recommendations  Elevated troponin Troponin 7-18 in the setting of near syncopal event with concern for cardiac etiology EKG normal sinus rhythm with nonspecific changes Heart score around 5-6 Status post full dose aspirin 2D echo Will consult cardiology for possible stress test versus further intervention  GERD (gastroesophageal reflux disease) PPI        Advance Care Planning:   Code Status: Full Code   Consults: Cardiology  Family Communication: Wife at the bedside   Severity of Illness: The appropriate patient status for this patient is OBSERVATION. Observation  status is judged to be reasonable and necessary in order to provide the required intensity of service to ensure the patient's safety. The patient's presenting symptoms, physical exam findings, and initial radiographic and laboratory data in the context of their medical condition is felt to place them at decreased risk for further clinical deterioration. Furthermore, it is anticipated that the patient will be medically stable for discharge from the hospital within 2 midnights of admission.   Author: Floydene Flock, MD 08/04/2022 3:30 PM  For on call review [email or web address].

## 2022-08-04 NOTE — Assessment & Plan Note (Addendum)
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 EKG normal sinus rhythm with some nonspecific changes Troponin with albeit small uptrend from 7-18 Heart score around 5 Status post full dose aspirin in the ER Will plan for formal cardiology consult as patient may likely benefit from stress test MRI of the brain to better assess-neurology consult if there is any changes indicative of CVA event Fairly nonfocal neuroexam 2D echo Follow-up formal cardiology recommendations

## 2022-08-04 NOTE — ED Triage Notes (Signed)
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 Pt states he thinks he may have gotten too hot. Pt also endorsing atraumatic L arm numbness x a few weeks. Pt denies any injuries, denies chest pain, denies shortness of breath.   VSS, NAD, speaking in clear, complete sentences. AO4/GCS15.  114 sinus tach on EKG 99 CBG 153/91

## 2022-08-04 NOTE — Assessment & Plan Note (Signed)
PPI ?

## 2022-08-04 NOTE — Assessment & Plan Note (Signed)
Troponin 7-18 in the setting of near syncopal event with concern for cardiac etiology EKG normal sinus rhythm with nonspecific changes Heart score around 5-6 Status post full dose aspirin 2D echo Will consult cardiology for possible stress test versus further intervention

## 2022-08-04 NOTE — ED Notes (Signed)
Fluids provided. CB in reach

## 2022-08-05 ENCOUNTER — Observation Stay (HOSPITAL_BASED_OUTPATIENT_CLINIC_OR_DEPARTMENT_OTHER)
Admit: 2022-08-05 | Discharge: 2022-08-05 | Disposition: A | Discharge status: Home / Self Care | Payer: BC Managed Care – PPO | Attending: Family Medicine | Admitting: Family Medicine

## 2022-08-05 ENCOUNTER — Telehealth: Payer: Self-pay | Admitting: Nurse Practitioner

## 2022-08-05 ENCOUNTER — Inpatient Hospital Stay (HOSPITAL_COMMUNITY)
Admit: 2022-08-05 | Discharge: 2022-08-05 | Disposition: A | Discharge status: Home / Self Care | Payer: BC Managed Care – PPO | Attending: Nurse Practitioner | Admitting: Nurse Practitioner

## 2022-08-05 DIAGNOSIS — R079 Chest pain, unspecified: Secondary | ICD-10-CM

## 2022-08-05 DIAGNOSIS — R61 Generalized hyperhidrosis: Secondary | ICD-10-CM

## 2022-08-05 DIAGNOSIS — R55 Syncope and collapse: Secondary | ICD-10-CM

## 2022-08-05 DIAGNOSIS — R202 Paresthesia of skin: Secondary | ICD-10-CM

## 2022-08-05 DIAGNOSIS — K219 Gastro-esophageal reflux disease without esophagitis: Secondary | ICD-10-CM | POA: Diagnosis not present

## 2022-08-05 DIAGNOSIS — R0609 Other forms of dyspnea: Secondary | ICD-10-CM

## 2022-08-05 DIAGNOSIS — R7989 Other specified abnormal findings of blood chemistry: Secondary | ICD-10-CM

## 2022-08-05 LAB — ECHOCARDIOGRAM COMPLETE
AR max vel: 3.41 cm2
AV Area VTI: 3.49 cm2
AV Area mean vel: 3.04 cm2
AV Mean grad: 2 mmHg
AV Peak grad: 3.6 mmHg
Ao pk vel: 0.95 m/s
Area-P 1/2: 3.89 cm2
Height: 67 in
MV VTI: 2.52 cm2
S' Lateral: 3.2 cm
Weight: 2800 oz

## 2022-08-05 MED ORDER — PERFLUTREN LIPID MICROSPHERE
1.0000 mL | INTRAVENOUS | Status: AC | PRN
  Administered 2022-08-05: 2 mL via INTRAVENOUS

## 2022-08-05 MED ORDER — ASPIRIN 81 MG PO TBEC: 81.0000 mg | DELAYED_RELEASE_TABLET | Freq: Every day | ORAL | 1 refills | Status: DC

## 2022-08-05 NOTE — Progress Notes (Signed)
*  PRELIMINARY RESULTS* Echocardiogram 2D Echocardiogram has been performed.  Cristela Blue 08/05/2022, 9:01 AM

## 2022-08-05 NOTE — Telephone Encounter (Signed)
Good morning,  Bobby Mcbride is being discharged from [facility] today.  He is leaving here w/ a Zio monitor (placed by hosp staff).  He already has a f/u appt in June.  Dr. Mariah Milling would like for him to have a coronary CTA.  He has already had labs. Would you please reach out to him to schedule? I have placed order.  Thanks,  Nicolasa Ducking, NP 08/05/2022 10:08 AM

## 2022-08-05 NOTE — Discharge Summary (Signed)
Physician Discharge Summary   Patient: Bobby Mcbride MRN: [id number] DOB: [birth year]  Admit date:     08/04/2022  Discharge date: 08/06/22  Discharge Physician: Kathlen Mody   PCP: Eden Emms, NP   Recommendations at discharge:  Please follow up with PCP in one week.  Please follow up with cardiology as recommended.    Discharge Diagnoses: Principal Problem:   Near syncope Active Problems:   GERD (gastroesophageal reflux disease)   Elevated troponin   Paresthesia   Elevated troponin I level   Diaphoresis  Resolved Problems:   * No resolved hospital problems. *  Hospital Course:  Bobby Mcbride is a 52 y.o. male with medical history significant of GERD and hypertension presenting with near syncope.  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  Patient reports waking up this morning with left arm numbness and pain.  No overt chest pain chest pressure.  Assessment and Plan: * Near syncope Noted near syncopal episode assd w/ firefighter drill carrying 70+ lbs of equipment Echo is unremarkable.  CT head is negative. and CT C-spine showed degenerative changes.  CTA of the chest within normal notes. EKG normal sinus rhythm with some nonspecific changes Troponin with albeit small uptrend from 7-18 Heart score around 5 Aspirin 81 mg daily.  Cardiology consulted and recommendations given.  Plan for outpatient follow up with coronary CT.   Elevated troponin Troponin 7-18 in the setting of near syncopal event with concern for cardiac etiology EKG normal sinus rhythm with nonspecific changes NO Chest pain.   GERD (gastroesophageal reflux disease) PPI    Cervical radiculopathy;  Outpatient follow up with orthopedics recommended.  Pain control.     Consultants: cardiology.  Procedures performed:  echo Disposition: Home Diet recommendation:  Discharge Diet Orders (From admission, onward)     Start     Ordered   08/05/22 0000  Diet - low sodium heart healthy        08/05/22 1058           Cardiac diet DISCHARGE MEDICATION: Allergies as of 08/05/2022       Reactions   Codeine Other (See Comments)   Hallucinations   Hydrocodone-acetaminophen Other (See Comments)   Hallucinations   Food    Pepperoni-nausea/vomiting Lettuce-nausea/vomiting Oranges-nausea/vomiting Cheese-nausea/vomiting   Ibuprofen    "ulcers in my stomach"   Compazine [prochlorperazine Edisylate] Anxiety        Medication List     STOP taking these medications    benzonatate 100 MG capsule Commonly known as: TESSALON   Citrucel oral powder Generic drug: methylcellulose   fluticasone 50 MCG/ACT nasal spray Commonly known as: FLONASE   lidocaine 2 % solution Commonly known as: XYLOCAINE   ondansetron 4 MG tablet Commonly known as: ZOFRAN   oxyCODONE-acetaminophen 5-325 MG tablet Commonly known as: PERCOCET/ROXICET       TAKE these medications    aspirin EC 81 MG tablet Take 1 tablet (81 mg total) by mouth daily. Swallow whole. Start taking on: Aug 06, 2022   hydrocortisone 2.5 % rectal cream Commonly known as: ANUSOL-HC Apply 1 Application topically daily as needed for hemorrhoids.   pantoprazole 40 MG tablet Commonly known as: PROTONIX Take 1 tablet (40 mg total) by mouth daily.        Follow-up Information  Eden Emms, NP. Schedule an appointment as soon as possible for a visit in 1 week(s).   Specialties: Nurse Practitioner, Family Medicine Contact information: [address] [place] [address] [phone]                Discharge Exam: Ceasar Mons Weights   08/04/22 1127  Weight: 79.4 kg   General exam: Appears calm and comfortable  Respiratory system: Clear to auscultation. Respiratory effort normal. Cardiovascular system: S1 & S2  heard, RRR. No JVD, murmurs, rubs, gallops or clicks. No pedal edema. Gastrointestinal system: Abdomen is nondistended, soft and nontender. No organomegaly or masses felt. Normal bowel sounds heard. Central nervous system: Alert and oriented. No focal neurological deficits. Extremities: Symmetric 5 x 5 power. Skin: No rashes, lesions or ulcers Psychiatry: Judgement and insight appear normal. Mood & affect appropriate.    Condition at discharge: fair  The results of significant diagnostics from this hospitalization (including imaging, microbiology, ancillary and laboratory) are listed below for reference.   Imaging Studies: MR BRAIN WO CONTRAST  Result Date: 08/04/2022 CLINICAL DATA:  Neuro deficit, acute, stroke suspected. Left arm paresthesia. EXAM: MRI HEAD WITHOUT CONTRAST TECHNIQUE: Multiplanar, multiecho pulse sequences of the brain and surrounding structures were obtained without intravenous contrast. COMPARISON:  Head CT same day FINDINGS: Brain: The brain has a normal appearance without evidence of malformation, atrophy, old or acute small or large vessel infarction, mass lesion, hemorrhage, hydrocephalus or extra-axial collection. Vascular: Major vessels at the base of the brain show flow. Venous sinuses appear patent. Skull and upper cervical spine: Normal. Sinuses/Orbits: Fluid opacification of the maxillary sinuses that could be due to a combination of sinus fluid and retention cysts. Orbits negative. Other: None significant. IMPRESSION: 1. Normal appearance of the brain itself. 2. Fluid opacification of the maxillary sinuses that could be due to a combination of sinus fluid and retention cysts. Electronically Signed   By: Paulina Fusi M.D.   On: 08/04/2022 16:33   CT Head Wo Contrast  Result Date: 08/04/2022 CLINICAL DATA:  Left arm pain and paresthesias for the past 2 weeks. EXAM: CT HEAD WITHOUT CONTRAST CT CERVICAL SPINE WITHOUT CONTRAST TECHNIQUE: Multidetector CT imaging of the  head and cervical spine was performed following the standard protocol without intravenous contrast. Multiplanar CT image reconstructions of the cervical spine were also generated. RADIATION DOSE REDUCTION: This exam was performed according to the departmental dose-optimization program which includes automated exposure control, adjustment of the mA and/or kV according to patient size and/or use of iterative reconstruction technique. COMPARISON:  CT neck dated June 28, 2021. CT head dated Aug 12, 2011. FINDINGS: CT HEAD FINDINGS Brain: No evidence of acute infarction, hemorrhage, hydrocephalus, extra-axial collection or mass lesion/mass effect. Vascular: No hyperdense vessel or unexpected calcification. Skull: Normal. Negative for fracture or focal lesion. Sinuses/Orbits: No acute finding. Other: None. CT CERVICAL SPINE FINDINGS Alignment: Unchanged reversal of the normal cervical lordosis. No significant listhesis. Skull base and vertebrae: No acute fracture. No primary bone lesion or focal pathologic process. 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 Soft tissues and spinal canal: No prevertebral fluid or swelling. No visible canal hematoma. Disc levels: Unchanged severe disc height loss and moderate uncovertebral hypertrophy at C4-C5. Unchanged mild-to-moderate disc height loss at C5-C6 and C6-C7 with moderate uncovertebral hypertrophy on the left at C5-C6 and on the right at C6-C7. Mild left neuroforaminal stenosis at C4-C5 and C5-C6. Moderate right neuroforaminal stenosis at C6-C7. No high-grade spinal canal stenosis. Upper chest: Biapical pleuroparenchymal scarring.  Other: None. IMPRESSION: 1. No acute intracranial abnormality. 2. No acute cervical spine fracture or traumatic listhesis. 3. 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 Electronically Signed   By: Obie Dredge M.D.   On: 08/04/2022 13:17   CT Cervical Spine Wo Contrast  Result Date: 08/04/2022 CLINICAL  DATA:  Left arm pain and paresthesias for the past 2 weeks. EXAM: CT HEAD WITHOUT CONTRAST CT CERVICAL SPINE WITHOUT CONTRAST TECHNIQUE: Multidetector CT imaging of the head and cervical spine was performed following the standard protocol without intravenous contrast. Multiplanar CT image reconstructions of the cervical spine were also generated. RADIATION DOSE REDUCTION: This exam was performed according to the departmental dose-optimization program which includes automated exposure control, adjustment of the mA and/or kV according to patient size and/or use of iterative reconstruction technique. COMPARISON:  CT neck dated June 28, 2021. CT head dated Aug 12, 2011. FINDINGS: CT HEAD FINDINGS Brain: No evidence of acute infarction, hemorrhage, hydrocephalus, extra-axial collection or mass lesion/mass effect. Vascular: No hyperdense vessel or unexpected calcification. Skull: Normal. Negative for fracture or focal lesion. Sinuses/Orbits: No acute finding. Other: None. CT CERVICAL SPINE FINDINGS Alignment: Unchanged reversal of the normal cervical lordosis. No significant listhesis. Skull base and vertebrae: No acute fracture. No primary bone lesion or focal pathologic process. 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 Soft tissues and spinal canal: No prevertebral fluid or swelling. No visible canal hematoma. Disc levels: Unchanged severe disc height loss and moderate uncovertebral hypertrophy at C4-C5. Unchanged mild-to-moderate disc height loss at C5-C6 and C6-C7 with moderate uncovertebral hypertrophy on the left at C5-C6 and on the right at C6-C7. Mild left neuroforaminal stenosis at C4-C5 and C5-C6. Moderate right neuroforaminal stenosis at C6-C7. No high-grade spinal canal stenosis. Upper chest: Biapical pleuroparenchymal scarring. Other: None. IMPRESSION: 1. No acute intracranial abnormality. 2. No acute cervical spine fracture or traumatic listhesis. 3. Unchanged multilevel degenerative changes of the cervical  spine as described above. Electronically Signed   By: Obie Dredge M.D.   On: 08/04/2022 13:17   CT Angio Chest Aorta W and/or Wo Contrast  Result Date: 08/04/2022 CLINICAL DATA:  Chest and back pain with near-syncope today. Two weeks of left arm pain and paresthesias. EXAM: CT ANGIOGRAPHY CHEST WITH CONTRAST TECHNIQUE: Multidetector CT imaging of the chest was performed using the standard protocol during bolus administration of intravenous contrast. Multiplanar CT image reconstructions and MIPs were obtained to evaluate the vascular anatomy. RADIATION DOSE REDUCTION: This exam was performed according to the departmental dose-optimization program which includes automated exposure control, adjustment of the mA and/or kV according to patient size and/or use of iterative reconstruction technique. CONTRAST:  75mL OMNIPAQUE IOHEXOL 350 MG/ML SOLN COMPARISON:  Chest x-ray dated Aug 07, 2021. FINDINGS: Cardiovascular: Preferential opacification of the thoracic aorta. No evidence of thoracic aortic aneurysm or dissection. Normal heart size. No pericardial effusion. No pulmonary embolism. Mediastinum/Nodes: No enlarged mediastinal, hilar, or axillary lymph nodes. Thyroid gland, trachea, and esophagus demonstrate no significant findings. Lungs/Pleura: No focal consolidation, pleural effusion, or pneumothorax. Upper Abdomen: No acute abnormality. Musculoskeletal: No chest wall abnormality. No acute or significant osseous findings. 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 Review of the MIP images confirms the above findings. IMPRESSION: 1. No evidence of thoracic aortic aneurysm or dissection. No acute intrathoracic process. Electronically Signed   By: Obie Dredge M.D.   On: 08/04/2022 13:09    Microbiology: Results for orders placed or performed during the hospital encounter of 08/07/21  Resp Panel by RT-PCR (Flu A&B, Covid) Anterior Nasal Swab     Status: None   Collection  Time: 08/07/21  7:45 AM   Specimen: Anterior Nasal Swab  Result Value Ref Range Status   SARS Coronavirus 2 by RT PCR NEGATIVE NEGATIVE Final    Comment: (NOTE) SARS-CoV-2 target nucleic acids are NOT DETECTED.  The SARS-CoV-2 RNA is generally detectable in upper respiratory specimens during the acute phase of infection. The lowest concentration of SARS-CoV-2 viral copies this assay can detect is 138 copies/mL. A negative result does not preclude SARS-Cov-2 infection and should not be used as the sole basis for treatment or other patient management decisions. A negative result may occur with  improper specimen collection/handling, submission of specimen other than nasopharyngeal swab, presence of viral mutation(s) within the areas targeted by this assay, and inadequate number of viral copies(<138 copies/mL). A negative result must be combined with clinical observations, patient history, and epidemiological information. The expected result is Negative.  Fact Sheet for Patients:  BloggerCourse.com  Fact Sheet for Healthcare Providers:  SeriousBroker.it  This test is no t yet approved or cleared by the [place] FDA and  has been authorized for detection and/or diagnosis of SARS-CoV-2 by FDA under an Emergency Use Authorization (EUA). This EUA will remain  in effect (meaning this test can be used) for the duration of the COVID-19 declaration under Section 564(b)(1) of the Act, 21 U.S.C.section 360bbb-3(b)(1), unless the authorization is terminated  or revoked sooner.       Influenza A by PCR NEGATIVE NEGATIVE Final   Influenza B by PCR NEGATIVE NEGATIVE Final    Comment: (NOTE) The Xpert Xpress SARS-CoV-2/FLU/RSV plus assay is intended as an aid in the diagnosis of influenza from Nasopharyngeal swab specimens and should not be used as a sole basis for treatment. Nasal washings and aspirates are unacceptable for Xpert Xpress  SARS-CoV-2/FLU/RSV testing.  Fact Sheet for Patients: BloggerCourse.com  Fact Sheet for Healthcare Providers: SeriousBroker.it  This test is not yet approved or cleared by the [place] FDA and has been authorized for detection and/or diagnosis of SARS-CoV-2 by FDA under an Emergency Use Authorization (EUA). This EUA will remain in effect (meaning this test can be used) for the duration of the COVID-19 declaration under Section 564(b)(1) of the Act, 21 U.S.C. section 360bbb-3(b)(1), unless the authorization is terminated or revoked.  Performed at [facility], [address] [place], [place] [address]     Labs: CBC: Recent Labs  Lab 08/04/22 1125  WBC 8.7  HGB 16.7  HCT 47.7  MCV 83.2  PLT 267   Basic Metabolic Panel: Recent Labs  Lab 08/04/22 1125  NA 137  K 3.7  CL 105  CO2 24  GLUCOSE 107*  BUN 9  CREATININE 1.13  CALCIUM 8.6*   Liver Function Tests: No results for input(s): "AST", "ALT", "ALKPHOS", "BILITOT", "PROT", "ALBUMIN" in the last 168 hours. CBG: Recent Labs  Lab 08/04/22 1131  GLUCAP 100*    Discharge time spent: 42 minutes.   Signed: Kathlen Mody, MD [facility]

## 2022-08-06 LAB — HEMOGLOBIN A1C
Hgb A1c MFr Bld: 5.4 % (ref 4.8–5.6)
Mean Plasma Glucose: 108 mg/dL

## 2022-08-07 DIAGNOSIS — R55 Syncope and collapse: Secondary | ICD-10-CM | POA: Diagnosis not present

## 2022-08-11 ENCOUNTER — Ambulatory Visit: Admission: RE | Admit: 2022-08-11 | Payer: BC Managed Care – PPO | Source: Ambulatory Visit

## 2022-08-24 ENCOUNTER — Telehealth (HOSPITAL_COMMUNITY): Payer: Self-pay | Admitting: Emergency Medicine

## 2022-08-24 DIAGNOSIS — R079 Chest pain, unspecified: Secondary | ICD-10-CM

## 2022-08-24 MED ORDER — IVABRADINE HCL 5 MG PO TABS
15.0000 mg | ORAL_TABLET | Freq: Once | ORAL | 0 refills | Status: AC
Start: 2022-08-24 — End: 2022-08-24

## 2022-08-24 MED ORDER — METOPROLOL TARTRATE 100 MG PO TABS
100.0000 mg | ORAL_TABLET | Freq: Once | ORAL | 0 refills | Status: DC
Start: 2022-08-24 — End: 2022-12-28

## 2022-08-24 NOTE — Telephone Encounter (Signed)
Pt states between jobs and insur has termed. New insur not active yet.   Appt canceled for now until he can get new ins info. Will sched his ASAP.  Rockwell Alexandria RN Navigator Cardiac Imaging [facility] [phone] Office  [phone] Cell

## 2022-08-24 NOTE — Telephone Encounter (Signed)
Reaching out to patient to offer assistance regarding upcoming cardiac imaging study; pt verbalizes understanding of appt date/time, parking situation and where to check in, pre-test NPO status and medications ordered, and verified current allergies; name and call back number provided for further questions should they arise Damian Hofstra RN Navigator Cardiac Imaging [facility] [phone] office [phone] cell  100mg metoprolol + 15mg ivabradine  

## 2022-08-25 ENCOUNTER — Ambulatory Visit: Admission: RE | Admit: 2022-08-25 | Payer: BC Managed Care – PPO | Source: Ambulatory Visit

## 2022-08-25 NOTE — Addendum Note (Signed)
Encounter addended by: Oneida Arenas on: 08/25/2022 7:39 AM  Actions taken: Imaging Exam ended

## 2022-08-26 ENCOUNTER — Ambulatory Visit: Payer: Self-pay | Admitting: Orthopedic Surgery

## 2022-09-01 ENCOUNTER — Ambulatory Visit: Payer: BC Managed Care – PPO | Admitting: Nurse Practitioner

## 2022-10-27 ENCOUNTER — Ambulatory Visit: Admission: RE | Admit: 2022-10-27 | Payer: BLUE CROSS/BLUE SHIELD | Source: Ambulatory Visit

## 2022-10-28 ENCOUNTER — Telehealth: Payer: Self-pay | Admitting: Nurse Practitioner

## 2022-10-28 NOTE — Telephone Encounter (Signed)
Called patient to schedule next available cpe, no answer.

## 2022-11-01 ENCOUNTER — Ambulatory Visit: Payer: BC Managed Care – PPO | Admitting: Nurse Practitioner

## 2022-11-23 ENCOUNTER — Ambulatory Visit: Payer: BC Managed Care – PPO | Admitting: Orthopedic Surgery

## 2022-11-23 ENCOUNTER — Other Ambulatory Visit (INDEPENDENT_AMBULATORY_CARE_PROVIDER_SITE_OTHER): Payer: BC Managed Care – PPO

## 2022-11-23 DIAGNOSIS — M5416 Radiculopathy, lumbar region: Secondary | ICD-10-CM | POA: Diagnosis not present

## 2022-11-23 MED ORDER — PREGABALIN 75 MG PO CAPS: 75.0000 mg | ORAL_CAPSULE | Freq: Two times a day (BID) | ORAL | 0 refills | Status: DC

## 2022-11-23 NOTE — Progress Notes (Signed)
Orthopedic Spine Surgery Office Note   Assessment: Patient is a 52 y.o. male with chronic axial low back pain and pain that radiates down the posterior aspect of the right thigh, suspect radiculopathy     Plan: -Explained that initially conservative treatment is tried as a significant number of patients may experience relief with these treatment modalities. Discussed that the conservative treatments include:             -activity modification             -physical therapy             -over the counter pain medications             -medrol dosepak             -lumbar steroid injections -Patient has tried injections in the past (last one in 2021), over-the-counter pain medications, PT, home exercise program -Patient has not gotten better despite conservative treatments that were done for greater than 6 weeks, so recommended MRI of the lumbar spine to evaluate for radiculopathy -Prescribed Lyrica to try for pain relief -Patient should return to the office in 4 weeks, x-rays at next visit: None     Patient expressed understanding of the plan and all questions were answered to their satisfaction.    ___________________________________________________________________________     History:   Patient is a 52 y.o. male who presents today for follow-up on lumbar spine.  After last visit, he did physical therapy and a home exercise program.  He states he still had low back pain and it has gotten worse.  Within the last 6 weeks it has gotten notably worse.  He has developed worse pain in his back and pain radiating into the right lower extremity.  He feels it along the posterior aspect of the right thigh.  He is not having any pain radiating into the left lower extremity.  Pain does not radiate past the knee on the right side.  Denies paresthesias and numbness.  There was no trauma or injury that preceded this worsening of pain.  He has not had any bowel or bladder incontinence.  No saddle  anesthesia.  Treatments tried: Injections several years ago, over-the-counter pain medications, PT, home exercise program    Physical Exam:   General: no acute distress, appears stated age Neurologic: alert, answering questions appropriately, following commands Respiratory: unlabored breathing on room air, symmetric chest rise Psychiatric: appropriate affect, normal cadence to speech     MSK (spine):   -Strength exam                                                   Left                  Right EHL                              4/5                  4/5 TA                                 5/5  5/5 GSC                             5/5                  5/5 Knee extension            5/5                  5/5 Hip flexion                    5/5                  5/5   -Sensory exam                           Sensation intact to light touch in L3-S1 nerve distributions of bilateral lower extremities   -Achilles DTR: 2/4 on the left, 2/4 on the right -Patellar tendon DTR: 2/4 on the left, 2/4 on the right   -Straight leg raise: Negative bilaterally -Femoral nerve stretch test: Negative -Clonus: no beats bilaterally   -Left hip exam: No pain through range of motion, negative Stinchfield, negative FABER -Right hip exam: Back pain was reproduced with FADIR and FABER, no pain with logroll, negative Stinchfield.  No groin pain was produced with any of these maneuvers   Imaging: X-ray of the lumbar spine from 11/23/2022 was independently reviewed and interpreted, showing no significant degenerative changes.  No evidence of instability on flexion or extension views.  No fracture or dislocation seen.     Patient name: Bobby Mcbride Patient MRN: [id number] Date of visit: 11/23/22

## 2022-12-01 ENCOUNTER — Ambulatory Visit
Admission: RE | Admit: 2022-12-01 | Discharge: 2022-12-01 | Disposition: A | Discharge status: Home / Self Care | Payer: BC Managed Care – PPO | Source: Ambulatory Visit | Attending: Orthopedic Surgery | Admitting: Orthopedic Surgery

## 2022-12-01 ENCOUNTER — Ambulatory Visit: Payer: BC Managed Care – PPO | Admitting: Cardiology

## 2022-12-01 DIAGNOSIS — M545 Low back pain, unspecified: Secondary | ICD-10-CM | POA: Diagnosis not present

## 2022-12-01 DIAGNOSIS — M5416 Radiculopathy, lumbar region: Secondary | ICD-10-CM

## 2022-12-21 ENCOUNTER — Ambulatory Visit (INDEPENDENT_AMBULATORY_CARE_PROVIDER_SITE_OTHER): Payer: BC Managed Care – PPO | Admitting: Orthopedic Surgery

## 2022-12-21 DIAGNOSIS — G8929 Other chronic pain: Secondary | ICD-10-CM

## 2022-12-21 DIAGNOSIS — M545 Low back pain, unspecified: Secondary | ICD-10-CM

## 2022-12-21 MED ORDER — TRAMADOL HCL 50 MG PO TABS: 50.0000 mg | ORAL_TABLET | Freq: Four times a day (QID) | ORAL | 0 refills | Status: AC | PRN

## 2022-12-21 MED ORDER — PREGABALIN 75 MG PO CAPS: 75.0000 mg | ORAL_CAPSULE | Freq: Two times a day (BID) | ORAL | 0 refills | Status: DC

## 2022-12-21 NOTE — Progress Notes (Signed)
Orthopedic Spine Surgery Office Note 2   Assessment: Patient is a 52 y.o. male with chronic axial low back pain and pain that radiates down the posterior aspect of the right thigh. No significant stenosis seen.      Plan: -Patient has tried injections in the past (last one in 2021), over-the-counter pain medications, PT, home exercise program -Feels lyrica has been somewhat helpful so this was re-prescribed to him. Tramadol was also prescribed for him today -Talked about bracing and repeat injections. He was not interested in either of these. We then talked about pain management and he wanted to try that. Referral provided to him today -Told him that unfortunately I cannot think of a surgery that would provide him with a predictable benefit, so I do not recommend surgery at this time given the risks associated with surgery -Patient should return to the office on an as-needed basis     Patient expressed understanding of the plan and all questions were answered to their satisfaction.    ___________________________________________________________________________     History:   Patient is a 52 y.o. male who presents today for follow-up on lumbar spine.  Patient is still having significant low back pain.  He also has pain that is going into the posterior aspect of the right thigh.  He is not having any left-sided symptoms.  He says that his pain is severe and he is having difficulty working because of the pain.  He has not developed any new symptoms since the last time he was seen.   Treatments tried: injections several years ago, over-the-counter pain medications, PT, home exercise program     Physical Exam:   General: no acute distress, appears stated age Neurologic: alert, answering questions appropriately, following commands Respiratory: unlabored breathing on room air, symmetric chest rise Psychiatric: appropriate affect, normal cadence to speech     MSK (spine):   -Strength exam                                                    Left                  Right EHL                              4/5                  4/5 TA                                 5/5                  5/5 GSC                             5/5                  5/5 Knee extension            5/5                  5/5 Hip flexion                    5/5  5/5   -Sensory exam                           Sensation intact to light touch in L3-S1 nerve distributions of bilateral lower extremities   -Straight leg raise: Negative bilaterally -Clonus: no beats bilaterally    Imaging: X-ray of the lumbar spine from 11/23/2022 was previously independently reviewed and interpreted, showing no significant degenerative changes.  No evidence of instability on flexion or extension views.  No fracture or dislocation seen.   MRI of the lumbar spine from 12/01/2022 was independently reviewed and interpreted, showing no significant stenosis.  Disc desiccation at L5/S1.  No other significant degenerative disc disease.  Hypoplasia of the left S1 pedicle.    Patient name: Bobby Mcbride Patient MRN: [id number] Date of visit: 12/21/22

## 2022-12-25 ENCOUNTER — Encounter (HOSPITAL_COMMUNITY): Payer: Self-pay | Admitting: Pharmacy Technician

## 2022-12-25 ENCOUNTER — Emergency Department (HOSPITAL_COMMUNITY)
Admission: EM | Admit: 2022-12-25 | Discharge: 2022-12-25 | Disposition: A | Discharge status: Home / Self Care | Payer: BC Managed Care – PPO | Attending: Emergency Medicine | Admitting: Emergency Medicine

## 2022-12-25 ENCOUNTER — Other Ambulatory Visit: Payer: Self-pay

## 2022-12-25 DIAGNOSIS — M545 Low back pain, unspecified: Secondary | ICD-10-CM | POA: Diagnosis not present

## 2022-12-25 DIAGNOSIS — Z7982 Long term (current) use of aspirin: Secondary | ICD-10-CM | POA: Diagnosis not present

## 2022-12-25 DIAGNOSIS — M5441 Lumbago with sciatica, right side: Secondary | ICD-10-CM | POA: Diagnosis not present

## 2022-12-25 MED ORDER — PREDNISONE 10 MG PO TABS
ORAL_TABLET | ORAL | 0 refills | Status: DC
Start: 2022-12-25 — End: 2022-12-25

## 2022-12-25 MED ORDER — METHOCARBAMOL 750 MG PO TABS
750.0000 mg | ORAL_TABLET | Freq: Two times a day (BID) | ORAL | 0 refills | Status: DC
Start: 2022-12-25 — End: 2023-09-14

## 2022-12-25 MED ORDER — METHYLPREDNISOLONE SODIUM SUCC 125 MG IJ SOLR
125.0000 mg | Freq: Once | INTRAMUSCULAR | Status: AC
  Administered 2022-12-25: 125 mg via INTRAVENOUS
  Filled 2022-12-25: qty 2

## 2022-12-25 MED ORDER — FENTANYL CITRATE PF 50 MCG/ML IJ SOSY
75.0000 ug | PREFILLED_SYRINGE | Freq: Once | INTRAMUSCULAR | Status: AC
  Administered 2022-12-25: 75 ug via INTRAVENOUS
  Filled 2022-12-25: qty 2

## 2022-12-25 MED ORDER — PREDNISONE 10 MG PO TABS
ORAL_TABLET | ORAL | 0 refills | Status: DC
Start: 2022-12-25 — End: 2023-09-14

## 2022-12-25 MED ORDER — DIAZEPAM 5 MG/ML IJ SOLN
5.0000 mg | Freq: Once | INTRAMUSCULAR | Status: AC
  Administered 2022-12-25: 5 mg via INTRAVENOUS
  Filled 2022-12-25: qty 2

## 2022-12-25 MED ORDER — OXYCODONE-ACETAMINOPHEN 5-325 MG PO TABS
1.0000 | ORAL_TABLET | Freq: Four times a day (QID) | ORAL | 0 refills | Status: DC | PRN
Start: 2022-12-25 — End: 2023-09-14

## 2022-12-25 MED ORDER — HYDROMORPHONE HCL 1 MG/ML IJ SOLN
0.5000 mg | Freq: Once | INTRAMUSCULAR | Status: AC
  Administered 2022-12-25: 0.5 mg via INTRAVENOUS
  Filled 2022-12-25: qty 1

## 2022-12-25 NOTE — Discharge Instructions (Signed)
Take the medications as prescribed. REST the back, means up to the bathroom only, for the next week.   Follow up with your doctor for information on referral for pain management.

## 2022-12-25 NOTE — ED Provider Notes (Signed)
[place] EMERGENCY DEPARTMENT AT [facility] Provider Note   CSN: [id number] Arrival date & time: 12/25/22  4098     History  Chief Complaint  Patient presents with   Back Pain    Bobby Mcbride is a 52 y.o. male.  Patient to ED with severe back pain with radiation into the right leg. He reports it started 3 weeks ago and has gotten progressively worse. He was seen by othopedics Willia Craze) 5 days ago, had MRI and xrays. They were told it would need to be treated medically and surgery was not an option. Since that time, he has taken the medications provided but is not getting any relief. No bowel/bladder dysfunction. No numbness or weakness of the lower extremities. No abdominal pain.  The history is provided by the patient. No language interpreter was used.  Back Pain      Home Medications Prior to Admission medications   Medication Sig Start Date End Date Taking? Authorizing Provider  aspirin EC 81 MG tablet Take 1 tablet (81 mg total) by mouth daily. Swallow whole. 08/06/22   Kathlen Mody, MD  hydrocortisone (ANUSOL-HC) 2.5 % rectal cream Apply 1 Application topically daily as needed for hemorrhoids. 11/01/21   [provider]  methocarbamol (ROBAXIN) 750 MG tablet Take 1 tablet (750 mg total) by mouth 2 (two) times daily. 12/25/22  Yes Kaitlinn Iversen, Melvenia Beam, PA-C  metoprolol tartrate (LOPRESSOR) 100 MG tablet Take 1 tablet (100 mg total) by mouth once for 1 dose. Please take one time dose 100mg  metoprolol tartrate 2 hr prior to cardiac CT for HR control IF HR >55bpm. 08/24/22 08/24/22  Debbe Odea, MD  oxyCODONE-acetaminophen (PERCOCET/ROXICET) 5-325 MG tablet Take 1 tablet by mouth every 6 (six) hours as needed for severe pain. 12/25/22  Yes Sheehan Stacey, Melvenia Beam, PA-C  pantoprazole (PROTONIX) 40 MG tablet Take 1 tablet (40 mg total) by mouth daily. 05/16/22   Hilarie Fredrickson, MD  predniSONE (DELTASONE) 10 MG tablet Take 6 on days one and two (12/26/22) Take 5 on  days three and four Take 4 on days five and six Take 3 on days seven and eight Take 2 on days nine and ten Take 1 on days eleven and twelve 12/25/22   Cormick Moss, Melvenia Beam, PA-C  pregabalin (LYRICA) 75 MG capsule Take 1 capsule (75 mg total) by mouth 2 (two) times daily. 12/21/22 01/20/23  London Sheer, MD  traMADol (ULTRAM) 50 MG tablet Take 1 tablet (50 mg total) by mouth every 6 (six) hours as needed for up to 5 days. 12/21/22 12/26/22  London Sheer, MD      Allergies    Codeine, Hydrocodone-acetaminophen, Food, Ibuprofen, and Compazine [prochlorperazine edisylate]    Review of Systems   Review of Systems  Musculoskeletal:  Positive for back pain.    Physical Exam Updated Vital Signs BP 126/84   Pulse 69   Temp 98.2 F (36.8 C) (Oral)   Resp 16   SpO2 96%  Physical Exam Vitals and nursing note reviewed.  Constitutional:      Appearance: Normal appearance.  Cardiovascular:     Rate and Rhythm: Normal rate.  Pulmonary:     Effort: Pulmonary effort is normal.  Abdominal:     Palpations: Abdomen is soft.     Tenderness: There is no abdominal tenderness.  Musculoskeletal:        General: Normal range of motion.     Cervical back: Normal range of motion.  Skin:  General: Skin is warm and dry.  Neurological:     Mental Status: He is alert and oriented to person, place, and time.     Sensory: No sensory deficit.     Deep Tendon Reflexes: Reflexes normal.     ED Results / Procedures / Treatments   Labs (all labs ordered are listed, but only abnormal results are displayed) Labs Reviewed - No data to display  EKG None  Radiology No results found.  Procedures Procedures    Medications Ordered in ED Medications  fentaNYL (SUBLIMAZE) injection 75 mcg (75 mcg Intravenous Given 12/25/22 0957)  methylPREDNISolone sodium succinate (SOLU-MEDROL) 125 mg/2 mL injection 125 mg (125 mg Intravenous Given 12/25/22 1000)  diazepam (VALIUM) injection 5 mg (5 mg Intravenous  Given 12/25/22 1049)  HYDROmorphone (DILAUDID) injection 0.5 mg (0.5 mg Intravenous Given 12/25/22 1050)    ED Course/ Medical Decision Making/ A&P Clinical Course as of 12/25/22 1526  Sun Dec 25, 2022  1044 Recheck after IV Fentanyl and Solumedrol, patient's pain is no better. IV Dilaudid 0.5 mg and Valium 5 mg IV ordered. Will continue to monitor.  [SU]  1127 Recheck after Dilaudid and Valium. Patient is somnolent but awake, oriented. O2 saturations 92-97%, in NAD. Vitals stable otherwise. Will continue to observe. [SU]  1218 Patient more awake. Stable VS, O2 sat 93%. Plan: when less somnolent will consider discharge when ambulatory. [SU]  1305 Feeling much better. Still has pain but moving more easily. Stands without assistance. Felt appropriate for discharge home. MRI done 9/19 reviewed. No further imaging felt indicated today.  [SU]    Clinical Course User Index [SU] Elpidio Anis, PA-C                                 Medical Decision Making Risk Prescription drug management.           Final Clinical Impression(s) / ED Diagnoses Final diagnoses:  Acute bilateral low back pain with right-sided sciatica    Rx / DC Orders ED Discharge Orders          Ordered    oxyCODONE-acetaminophen (PERCOCET/ROXICET) 5-325 MG tablet  Every 6 hours PRN        12/25/22 1310    methocarbamol (ROBAXIN) 750 MG tablet  2 times daily        12/25/22 1310    predniSONE (DELTASONE) 10 MG tablet  Status:  Discontinued        12/25/22 1310    predniSONE (DELTASONE) 10 MG tablet        12/25/22 1318              Elpidio Anis, PA-C 12/25/22 1526    Margarita Grizzle, MD 12/26/22 1321

## 2022-12-25 NOTE — ED Notes (Signed)
Pt given [place] sandwich and a coke as requested

## 2022-12-25 NOTE — ED Triage Notes (Signed)
Pt here via POV with reports of ongoing lower back pain. States pain radiates down R leg. Pt denies injury. Seen by [facility] and had an xray and an MRI. Started on several medications for pain. Pt waiting to hear back from pain management.

## 2022-12-28 ENCOUNTER — Ambulatory Visit: Payer: BC Managed Care – PPO | Attending: Nurse Practitioner | Admitting: Nurse Practitioner

## 2022-12-28 ENCOUNTER — Encounter: Payer: Self-pay | Admitting: Nurse Practitioner

## 2022-12-28 VITALS — BP 129/86 | HR 54 | Ht 67.0 in | Wt 177.4 lb

## 2022-12-28 DIAGNOSIS — R7989 Other specified abnormal findings of blood chemistry: Secondary | ICD-10-CM | POA: Diagnosis not present

## 2022-12-28 DIAGNOSIS — R55 Syncope and collapse: Secondary | ICD-10-CM

## 2022-12-28 NOTE — Progress Notes (Signed)
Office Visit    Patient Name: Bobby Mcbride Date of Encounter: 12/28/2022  Primary Care Provider:  Eden Emms, NP Primary Cardiologist:  Julien Nordmann, MD  Chief Complaint    52 y.o. male with a history of presyncope, left arm pain associated with minimal troponin elevation (18), GERD, esophagitis, Mallory-Weiss tear, and peptic ulcer disease, who presents for follow-up related to presyncope.  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 07/2022 Echo: EF 60-65%, no rwma, nl RV size/fxn.  No significant valvular disease.   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 per pt dx age 52; b. 07/2022 Echo: No MVP/MR.   Perianal lesion    Pre-syncope    a. 07/2022 Zio x 2 (total 4 wks): 1.  Predominantly sinus rhythm with average heart rate of 64 (38-144).  Rare PACs and PVCs. 2.  Predominantly sinus rhythm at average of 58 (37-99).  Rare PACs and PVCs.   Past Surgical History:  Procedure Laterality Date   APPENDECTOMY  1990   BIOPSY  09/03/2018   Procedure: BIOPSY;  Surgeon: Lynann Bologna, MD;  Location: [facility];  Service: Endoscopy;;   COLONOSCOPY WITH ESOPHAGOGASTRODUODENOSCOPY (EGD)  08/31/2021   by dr Christella Hartigan   ELBOW SURGERY Right 1987   ESOPHAGOGASTRODUODENOSCOPY  08/18/2011   Procedure: ESOPHAGOGASTRODUODENOSCOPY (EGD);  Surgeon: Beverley Fiedler, MD;  Location: [facility];  Service: Gastroenterology;  Laterality: N/A;    ESOPHAGOGASTRODUODENOSCOPY  11/11/2011   Procedure: ESOPHAGOGASTRODUODENOSCOPY (EGD);  Surgeon: Iva Boop, MD;  Location: [facility];  Service: Endoscopy;  Laterality: N/A;   ESOPHAGOGASTRODUODENOSCOPY (EGD) WITH PROPOFOL N/A 09/03/2018   Procedure: ESOPHAGOGASTRODUODENOSCOPY (EGD) WITH PROPOFOL;  Surgeon: Lynann Bologna, MD;  Location: [facility];  Service: Endoscopy;  Laterality: N/A;   ESOPHAGOGASTRODUODENOSCOPY (EGD) WITH PROPOFOL N/A 11/24/2021   Procedure: ESOPHAGOGASTRODUODENOSCOPY (EGD) WITH PROPOFOL;  Surgeon: Hilarie Fredrickson, MD;  Location: [facility];  Service: Gastroenterology;  Laterality: N/A;   KNEE ARTHROSCOPY Right 02/26/2009   @[facility]  by dr c. Magnus Ivan   MASS EXCISION N/A 10/15/2021   Procedure: EXCISION OF PERIANAL MASS;  Surgeon: Andria Meuse, MD;  Location: [facility];  Service: General;  Laterality: N/A;   MULTIPLE TOOTH EXTRACTIONS     several ---  w/ IV sedation  last extraction 05/ 2023   RECTAL EXAM UNDER ANESTHESIA N/A 10/15/2021   Procedure: ANORECTAL EXAM UNDER ANESTHESIA;  Surgeon: Andria Meuse, MD;  Location: [facility];  Service: General;  Laterality: N/A;   SHOULDER ARTHROSCOPY W/ ROTATOR CUFF REPAIR Right 2012   SKIN BIOPSY N/A 10/15/2021   Procedure: BIOPSY OF GLUTEAL SKIN;  Surgeon: Andria Meuse, MD;  Location: [facility];  Service: General;  Laterality: N/A;    Allergies  Allergies  Allergen Reactions   Codeine Other (See Comments)  Hallucinations   Hydrocodone-Acetaminophen Other (See Comments)    Hallucinations   Food     Pepperoni-nausea/vomiting Lettuce-nausea/vomiting Oranges-nausea/vomiting Cheese-nausea/vomiting   Ibuprofen     "ulcers in my stomach"   Compazine [Prochlorperazine Edisylate] Anxiety      History of Present Illness      52 y.o. y/o male with a history of presyncope, left arm pain associated with minimal troponin elevation (18), GERD, esophagitis,  Mallory-Weiss tear, and peptic ulcer disease.  He was previously admitted to [facility] in May 2024 in the setting of a 3-week history of left arm and shoulder pain associated with numbness.  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  In the emergency department, he was noted to have a peak troponin of 18.  ECG showed a left axis deviation with left anterior fascicular block and delayed R wave progression but no acute ST or T changes.  CT of the chest/aorta was negative for dissection while CT of the head and cervical spine were relatively unremarkable.  No significant coronary Ca2+ noted.  We saw him in consultation due to minimally elevated troponin.  Echo showed normal LV function without regional wall motion abnormalities.  He was discharged home with a ZIO monitor which showed predominantly sinus rhythm with rare PACs/PVCs.  He was initially scheduled for Cor CTA, however due to an insurance issue, this could not be carried out.   Since discharge, he has done well from a cardiac standpoint, without chest pain or dyspnea, though he recently hurt his back and was seen in the emergency department on October 13, where he was prescribed analgesics.  He he continues to have low back pain and is currently out of work.  He occasionally has a dizzy spell but has not experienced any syncope.  He has not been have any chest pain or dyspnea and prior to his ED visit, he was active without limitations.  He denies palpitations, PND, orthopnea, syncope, edema, or early satiety. Objective  Home Medications    Current Outpatient Medications  Medication Sig Dispense Refill   aspirin EC 81 MG tablet Take 1 tablet (81 mg total) by mouth daily. Swallow whole. 30 tablet 1   hydrocortisone (ANUSOL-HC) 2.5 % rectal cream Apply 1 Application topically daily as needed for hemorrhoids.     methocarbamol (ROBAXIN) 750 MG tablet Take 1 tablet  (750 mg total) by mouth 2 (two) times daily. 20 tablet 0   oxyCODONE-acetaminophen (PERCOCET/ROXICET) 5-325 MG tablet Take 1 tablet by mouth every 6 (six) hours as needed for severe pain. 20 tablet 0   pantoprazole (PROTONIX) 40 MG tablet Take 1 tablet (40 mg total) by mouth daily. 30 tablet 3   predniSONE (DELTASONE) 10 MG tablet Take 6 on days one and two (12/26/22) Take 5 on days three and four Take 4 on days five and six Take 3 on days seven and eight Take 2 on days nine and ten Take 1 on days eleven and twelve 42 tablet 0   pregabalin (LYRICA) 75 MG capsule Take 1 capsule (75 mg total) by mouth 2 (two) times daily. 60 capsule 0   metoprolol tartrate (LOPRESSOR) 100 MG tablet Take 1 tablet (100 mg total) by mouth once for 1 dose. Please take one time dose 100mg  metoprolol tartrate 2 hr prior to cardiac CT for HR control IF HR >55bpm. 1 tablet 0   No current facility-administered medications for this visit.  Physical Exam    VS:  BP 129/86   Pulse (!) 54   Ht 5\' 7"  (1.702 m)   Wt 177 lb 6.4 oz (80.5 kg)   SpO2 98%   BMI 27.78 kg/m  , BMI Body mass index is 27.78 kg/m.    Orthostatic VS for the past 24 hrs:  BP- Lying Pulse- Lying BP- Sitting Pulse- Sitting BP- Standing at 0 minutes Pulse- Standing at 0 minutes  12/28/22 1131 (!) 142/91 55 129/86 57 138/84 60       GEN: Well nourished, well developed, in no acute distress. HEENT: normal. Neck: Supple, no JVD, carotid bruits, or masses. Cardiac: RRR, no murmurs, rubs, or gallops. No clubbing, cyanosis, edema.  Radials 2+/PT 2+ and equal bilaterally.  Respiratory:  Respirations regular and unlabored, clear to auscultation bilaterally. GI: Soft, nontender, nondistended, BS + x 4. MS: no deformity or atrophy. Skin: warm and dry, no rash. Neuro:  Strength and sensation are intact. Psych: Normal affect.  Accessory Clinical Findings    ECG personally reviewed by me today - EKG Interpretation Date/Time:  Wednesday December 28 2022  11:18:01 EDT Ventricular Rate:  52 PR Interval:  128 QRS Duration:  86 QT Interval:  424 QTC Calculation: 394 R Axis:   -61  Text Interpretation: Sinus bradycardia Left anterior fascicular block Confirmed by Nicolasa Ducking [id number]) on 12/28/2022 12:39:56 PM  - no acute changes.  Lab Results  Component Value Date   WBC 8.7 08/04/2022   HGB 16.7 08/04/2022   HCT 47.7 08/04/2022   MCV 83.2 08/04/2022   PLT 267 08/04/2022   Lab Results  Component Value Date   CREATININE 1.13 08/04/2022   BUN 9 08/04/2022   NA 137 08/04/2022   K 3.7 08/04/2022   CL 105 08/04/2022   CO2 24 08/04/2022   Lab Results  Component Value Date   ALT 49 08/03/2021   AST 28 08/03/2021   ALKPHOS 68 08/03/2021   BILITOT 0.7 08/03/2021   Lab Results  Component Value Date   CHOL 208 (A) 06/17/2021   HDL 36 06/17/2021   LDLCALC 134 06/17/2021   TRIG 209 (A) 06/17/2021    Lab Results  Component Value Date   HGBA1C 5.4 08/04/2022   Lab Results  Component Value Date   TSH 1.57 08/03/2021       Assessment & Plan    1.  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  He became very hot and diaphoretic while wearing 70 pounds of gear, after walking up several flights of steps, and then became presyncopal.  Hospital workup showed no evidence of arrhythmia on telemetry or on subsequent outpatient event monitoring.  Echocardiogram showed normal LV function without significant valvular disease.  Troponin was minimally elevated at 18, which was nonspecific, especially in light of normal EF.  He occasionally experiences a dizzy spell but these are typically brief and not necessarily associated with position changes.  Blood pressure does drop slightly with rise in heart rate when moving from lying to a standing position.  Encouraged better hydration, as patient admits to hydrating poorly.    2.  History of  mild troponin elevation: In the setting of #1.  Echo with normal LV function.  We initially planned on coronary CT angiogram as an outpatient however, this was not able to be performed due to insurance issues.  Over the past 5 months, he has  remained active without chest pain or dyspnea.  No significant coronary calcium noted on prior CT.  As he has been doing well, defer any further ischemic eval.  3.  Low back pain: Recently seen in the emergency room.  Currently on muscle relaxants and steroids.  Patient to follow-up with primary care provider.  4.  Disposition: Follow-up as needed.  Nicolasa Ducking, NP 12/28/2022, 12:43 PM

## 2022-12-28 NOTE — Patient Instructions (Signed)
Medication Instructions:  No changes *If you need a refill on your cardiac medications before your next appointment, please call your pharmacy*   Lab Work: None ordered If you have labs (blood work) drawn today and your tests are completely normal, you will receive your results only by: MyChart Message (if you have MyChart) OR A paper copy in the mail If you have any lab test that is abnormal or we need to change your treatment, we will call you to review the results.   Testing/Procedures: None ordered   Follow-Up: At [facility], you and your health needs are our priority.  As part of our continuing mission to provide you with exceptional heart care, we have created designated Provider Care Teams.  These Care Teams include your primary Cardiologist (physician) and Advanced Practice Providers (APPs -  Physician Assistants and Nurse Practitioners) who all work together to provide you with the care you need, when you need it.  We recommend signing up for the patient portal called "MyChart".  Sign up information is provided on this After Visit Summary.  MyChart is used to connect with patients for Virtual Visits (Telemedicine).  Patients are able to view lab/test results, encounter notes, upcoming appointments, etc.  Non-urgent messages can be sent to your provider as well.   To learn more about what you can do with MyChart, go to [email or web address].    Your next appointment:   Follow up as needed

## 2022-12-29 ENCOUNTER — Telehealth: Payer: Self-pay | Admitting: Orthopedic Surgery

## 2022-12-29 NOTE — Telephone Encounter (Signed)
Pt called stating that he has on form coming from [employer]. He is asking if this form comes before his appt can Dr Christell Constant please fill out with hi paying $25.00 to [employer]. Pt states it is only one page and need it to receive him money for being out of work. Pt states he will discuss going back to work when he has his appt. Pt phone number is [phone].

## 2022-12-29 NOTE — Telephone Encounter (Signed)
I called and advised that we would do the form but that it make take a few days before I can get to it depending on our schedule here in the office, but that I would TRY to have it done by his appt on 01/04/23 @ 345 he states that he understands.

## 2023-01-04 ENCOUNTER — Ambulatory Visit: Payer: BC Managed Care – PPO | Admitting: Orthopedic Surgery

## 2023-01-04 DIAGNOSIS — G8929 Other chronic pain: Secondary | ICD-10-CM

## 2023-01-04 DIAGNOSIS — M5441 Lumbago with sciatica, right side: Secondary | ICD-10-CM | POA: Diagnosis not present

## 2023-01-04 NOTE — Progress Notes (Signed)
Orthopedic Spine Surgery Office Note 2   Assessment: Patient is a 52 y.o. male with chronic axial low back pain and pain that radiates down the posterior aspect of the right thigh. No significant stenosis seen.      Plan: -Patient has tried injections in the past, over-the-counter pain medications, PT, home exercise program -I think pain management is a reasonable option for him since I do not see any pathology on his MRI that could give him predictable relief with surgery. He already has an appt scheduled so he should continue with that appt -Patient should return to the office on an as-needed basis     Patient expressed understanding of the plan and all questions were answered to their satisfaction.    ___________________________________________________________________________     History:   Patient is a 52 y.o. male who presents today for follow-up on lumbar spine.  Patient still in significant low back pain.  He has pain that radiates into the posterior aspect of his right thigh as well.  He has not noticed any changes in his symptoms since he was last seen.  He has been able to establish care with [facility] and has his first appointment on 01/12/2023.   Treatments tried: lumbar injections, over-the-counter pain medications, PT, home exercise program     Physical Exam:   General: no acute distress, appears stated age Neurologic: alert, answering questions appropriately, following commands Respiratory: unlabored breathing on room air, symmetric chest rise Psychiatric: appropriate affect, normal cadence to speech     MSK (spine):   -Strength exam                                                   Left                  Right EHL                              4/5                  4/5 TA                                 5/5                  5/5 GSC                             5/5                  5/5 Knee extension            5/5                  5/5 Hip flexion                     5/5                  5/5   -Sensory exam                           Sensation intact to light touch in L3-S1 nerve distributions of  bilateral lower extremities     Imaging: X-ray of the lumbar spine from 11/23/2022 was previously independently reviewed and interpreted, showing no significant degenerative changes.  No evidence of instability on flexion or extension views.  No fracture or dislocation seen.   MRI of the lumbar spine from 12/01/2022 was previously independently reviewed and interpreted, showing no significant stenosis.  Disc desiccation at L5/S1.  No other significant degenerative disc disease.  Hypoplasia of the left S1 pedicle.     Patient name: Bobby Mcbride Patient MRN: [id number] Date of visit: 01/04/23

## 2023-01-05 ENCOUNTER — Telehealth: Payer: Self-pay | Admitting: Orthopedic Surgery

## 2023-01-05 NOTE — Telephone Encounter (Signed)
Shon Hale [employer] Automotive engineer) called requesting a call back concerning a pt's our of work note. He has questions/concerns. Please call Shon Hale at [phone].

## 2023-01-18 DIAGNOSIS — Z131 Encounter for screening for diabetes mellitus: Secondary | ICD-10-CM | POA: Diagnosis not present

## 2023-01-18 DIAGNOSIS — R5383 Other fatigue: Secondary | ICD-10-CM | POA: Diagnosis not present

## 2023-01-18 DIAGNOSIS — E7800 Pure hypercholesterolemia, unspecified: Secondary | ICD-10-CM | POA: Diagnosis not present

## 2023-01-18 DIAGNOSIS — Z79899 Other long term (current) drug therapy: Secondary | ICD-10-CM | POA: Diagnosis not present

## 2023-01-18 DIAGNOSIS — Z1159 Encounter for screening for other viral diseases: Secondary | ICD-10-CM | POA: Diagnosis not present

## 2023-01-18 DIAGNOSIS — E559 Vitamin D deficiency, unspecified: Secondary | ICD-10-CM | POA: Diagnosis not present

## 2023-01-18 DIAGNOSIS — M129 Arthropathy, unspecified: Secondary | ICD-10-CM | POA: Diagnosis not present

## 2023-01-18 DIAGNOSIS — Z6827 Body mass index (BMI) 27.0-27.9, adult: Secondary | ICD-10-CM | POA: Diagnosis not present

## 2023-01-18 DIAGNOSIS — M545 Low back pain, unspecified: Secondary | ICD-10-CM | POA: Diagnosis not present

## 2023-01-31 ENCOUNTER — Telehealth: Payer: Self-pay | Admitting: Internal Medicine

## 2023-01-31 ENCOUNTER — Ambulatory Visit (HOSPITAL_COMMUNITY)
Admission: EM | Admit: 2023-01-31 | Discharge: 2023-02-01 | Disposition: A | Discharge status: Home / Self Care | Payer: BC Managed Care – PPO | Attending: Emergency Medicine | Admitting: Emergency Medicine

## 2023-01-31 ENCOUNTER — Other Ambulatory Visit: Payer: Self-pay

## 2023-01-31 DIAGNOSIS — Z79899 Other long term (current) drug therapy: Secondary | ICD-10-CM | POA: Insufficient documentation

## 2023-01-31 DIAGNOSIS — K222 Esophageal obstruction: Secondary | ICD-10-CM | POA: Diagnosis not present

## 2023-01-31 DIAGNOSIS — K2289 Other specified disease of esophagus: Secondary | ICD-10-CM | POA: Insufficient documentation

## 2023-01-31 DIAGNOSIS — K449 Diaphragmatic hernia without obstruction or gangrene: Secondary | ICD-10-CM | POA: Diagnosis not present

## 2023-01-31 DIAGNOSIS — R1319 Other dysphagia: Secondary | ICD-10-CM

## 2023-01-31 DIAGNOSIS — K219 Gastro-esophageal reflux disease without esophagitis: Secondary | ICD-10-CM | POA: Diagnosis not present

## 2023-01-31 DIAGNOSIS — T18128A Food in esophagus causing other injury, initial encounter: Secondary | ICD-10-CM | POA: Diagnosis not present

## 2023-01-31 DIAGNOSIS — T182XXA Foreign body in stomach, initial encounter: Secondary | ICD-10-CM | POA: Diagnosis not present

## 2023-01-31 DIAGNOSIS — R1310 Dysphagia, unspecified: Secondary | ICD-10-CM | POA: Insufficient documentation

## 2023-01-31 DIAGNOSIS — R6889 Other general symptoms and signs: Secondary | ICD-10-CM | POA: Diagnosis not present

## 2023-01-31 DIAGNOSIS — W44F3XA Food entering into or through a natural orifice, initial encounter: Secondary | ICD-10-CM

## 2023-01-31 LAB — CBC WITH DIFFERENTIAL/PLATELET
Abs Immature Granulocytes: 0.03 10*3/uL (ref 0.00–0.07)
Basophils Absolute: 0 10*3/uL (ref 0.0–0.1)
Basophils Relative: 0 %
Eosinophils Absolute: 0.4 10*3/uL (ref 0.0–0.5)
Eosinophils Relative: 5 %
HCT: 47.5 % (ref 39.0–52.0)
Hemoglobin: 16.7 g/dL (ref 13.0–17.0)
Immature Granulocytes: 0 %
Lymphocytes Relative: 24 %
Lymphs Abs: 2.3 10*3/uL (ref 0.7–4.0)
MCH: 29.6 pg (ref 26.0–34.0)
MCHC: 35.2 g/dL (ref 30.0–36.0)
MCV: 84.1 fL (ref 80.0–100.0)
Monocytes Absolute: 0.9 10*3/uL (ref 0.1–1.0)
Monocytes Relative: 10 %
Neutro Abs: 5.8 10*3/uL (ref 1.7–7.7)
Neutrophils Relative %: 61 %
Platelets: 259 10*3/uL (ref 150–400)
RBC: 5.65 MIL/uL (ref 4.22–5.81)
RDW: 12.9 % (ref 11.5–15.5)
WBC: 9.5 10*3/uL (ref 4.0–10.5)
nRBC: 0 % (ref 0.0–0.2)

## 2023-01-31 LAB — BASIC METABOLIC PANEL
Anion gap: 9 (ref 5–15)
BUN: 8 mg/dL (ref 6–20)
CO2: 25 mmol/L (ref 22–32)
Calcium: 9.2 mg/dL (ref 8.9–10.3)
Chloride: 104 mmol/L (ref 98–111)
Creatinine, Ser: 1.06 mg/dL (ref 0.61–1.24)
GFR, Estimated: >60 mL/min (ref >60)
Glucose, Bld: 125 mg/dL — ABNORMAL HIGH (ref 70–99)
Potassium: 3.3 mmol/L — ABNORMAL LOW (ref 3.5–5.1)
Sodium: 138 mmol/L (ref 135–145)

## 2023-01-31 MED ORDER — GLUCAGON HCL RDNA (DIAGNOSTIC) 1 MG IJ SOLR
1.0000 mg | Freq: Once | INTRAMUSCULAR | Status: AC
  Administered 2023-01-31: 1 mg via INTRAVENOUS
  Filled 2023-01-31: qty 1

## 2023-01-31 MED ORDER — METOCLOPRAMIDE HCL 5 MG/ML IJ SOLN
10.0000 mg | INTRAMUSCULAR | Status: AC
  Administered 2023-01-31: 10 mg via INTRAVENOUS
  Filled 2023-01-31: qty 2

## 2023-01-31 NOTE — Telephone Encounter (Signed)
Patient with signs and symptoms of food impaction x several hours    Hx GERD + stricture  Advised go to [facility] ED and have me paged

## 2023-01-31 NOTE — ED Notes (Signed)
EDP schieving informed pts arrival and need for MSE

## 2023-01-31 NOTE — ED Notes (Signed)
PA  Lisa at bedside. 

## 2023-01-31 NOTE — ED Provider Notes (Signed)
[facility] AT [facility] Provider Note   CSN: [id number] Arrival date & time: 01/31/23  2148     History  Chief Complaint  Patient presents with   food bolus    Bobby Mcbride is a 52 y.o. male.  The history is provided by the patient and medical records.   52 year old male with history of GERD, esophageal strictures, gastropathy, presenting to the ED with food bolus.  This has been present since 2 PM while eating chicken.  States hx of same with eating roast beef in the past requiring endoscopy in the ED.  Called [facility] and told to come to the ED, usually follows with Dr. Christella Hartigan but has seen Dr. Marina Goodell as well.  Home Medications Prior to Admission medications   Medication Sig Start Date End Date Taking? Authorizing Provider  aspirin EC 81 MG tablet Take 1 tablet (81 mg total) by mouth daily. Swallow whole. 08/06/22   Kathlen Mody, MD  hydrocortisone (ANUSOL-HC) 2.5 % rectal cream Apply 1 Application topically daily as needed for hemorrhoids. 11/01/21   [provider]  methocarbamol (ROBAXIN) 750 MG tablet Take 1 tablet (750 mg total) by mouth 2 (two) times daily. 12/25/22   Elpidio Anis, PA-C  oxyCODONE-acetaminophen (PERCOCET/ROXICET) 5-325 MG tablet Take 1 tablet by mouth every 6 (six) hours as needed for severe pain. 12/25/22   Elpidio Anis, PA-C  pantoprazole (PROTONIX) 40 MG tablet Take 1 tablet (40 mg total) by mouth daily. 05/16/22   Hilarie Fredrickson, MD  predniSONE (DELTASONE) 10 MG tablet Take 6 on days one and two (12/26/22) Take 5 on days three and four Take 4 on days five and six Take 3 on days seven and eight Take 2 on days nine and ten Take 1 on days eleven and twelve 12/25/22   Upstill, Melvenia Beam, PA-C  pregabalin (LYRICA) 75 MG capsule Take 1 capsule (75 mg total) by mouth 2 (two) times daily. 12/21/22 01/20/23  London Sheer, MD      Allergies    Codeine, Hydrocodone-acetaminophen, Food, Ibuprofen, and Compazine  [prochlorperazine edisylate]    Review of Systems   Review of Systems  Constitutional:        Food impaction  All other systems reviewed and are negative.   Physical Exam Updated Vital Signs BP (!) 136/97   Pulse 92   Temp 98.3 F (36.8 C) (Oral)   Resp 20   SpO2 100%   Physical Exam Vitals and nursing note reviewed.  Constitutional:      Appearance: He is well-developed.     Comments: Seems a bit anxious, appears uncomfortable  HENT:     Head: Normocephalic and atraumatic.     Mouth/Throat:     Comments: Normal phonation without stridor, handling secretions well Eyes:     Conjunctiva/sclera: Conjunctivae normal.     Pupils: Pupils are equal, round, and reactive to light.  Cardiovascular:     Rate and Rhythm: Normal rate and regular rhythm.     Heart sounds: Normal heart sounds.  Pulmonary:     Effort: Pulmonary effort is normal.     Breath sounds: Normal breath sounds.  Abdominal:     General: Bowel sounds are normal.     Palpations: Abdomen is soft.  Musculoskeletal:        General: Normal range of motion.     Cervical back: Normal range of motion.  Skin:    General: Skin is warm and dry.  Neurological:  Mental Status: He is alert and oriented to person, place, and time.     ED Results / Procedures / Treatments   Labs (all labs ordered are listed, but only abnormal results are displayed) Labs Reviewed  BASIC METABOLIC PANEL - Abnormal; Notable for the following components:      Result Value   Potassium 3.3 (*)    Glucose, Bld 125 (*)    All other components within normal limits  CBC WITH DIFFERENTIAL/PLATELET    EKG None  Radiology No results found.  Procedures Procedures    Medications Ordered in ED Medications  glucagon (human recombinant) (GLUCAGEN) injection 1 mg (1 mg Intravenous Given 01/31/23 2251)  metoCLOPramide (REGLAN) injection 10 mg (10 mg Intravenous Given 01/31/23 2253)    ED Course/ Medical Decision Making/ A&P                                  Medical Decision Making Amount and/or Complexity of Data Reviewed Labs: ordered. ECG/medicine tests: ordered and independent interpretation performed.  Risk Prescription drug management.   52 year old male presenting to the ED with food impaction.  States he has felt this since eating chicken around 2 PM today.  History of same as well as esophageal strictures.  Called GI office and sent to the ED for further management.  He does seem uncomfortable and a bit anxious on exam.  He is handling secretions, no airway compromise at present.  VSS.  Trial of glucagon and reglan without success.  Will discuss with GI.  11:18 PM Spoke with on call GI, Dr. Leone Payor-- will get team ready, plan for EGD tonight.  0012-- taken to endoscopy.  Will plan to d/c from there.  Anticipate office FU with GI.  Final Clinical Impression(s) / ED Diagnoses Final diagnoses:  Food impaction of esophagus, initial encounter    Rx / DC Orders ED Discharge Orders     None         Garlon Hatchet, PA-C 02/01/23 0137    Franne Forts, DO 02/11/23 1524

## 2023-01-31 NOTE — ED Triage Notes (Signed)
[facility]. Sts he feels like he is choking. In triage pt is coughing and clinching his chest. Hx of esophagitis, eosinophilic. Per pt, pt called [facility] - spoke with Dr. Clearence Ped who requested to be called on pt arrival to ed.

## 2023-02-01 ENCOUNTER — Emergency Department (HOSPITAL_COMMUNITY): Payer: BC Managed Care – PPO | Admitting: Registered Nurse

## 2023-02-01 ENCOUNTER — Encounter (HOSPITAL_COMMUNITY): Payer: Self-pay | Admitting: *Deleted

## 2023-02-01 ENCOUNTER — Encounter (HOSPITAL_COMMUNITY)
Admission: EM | Disposition: A | Discharge status: Home / Self Care | Payer: Self-pay | Source: Home / Self Care | Attending: Emergency Medicine

## 2023-02-01 DIAGNOSIS — R1319 Other dysphagia: Secondary | ICD-10-CM

## 2023-02-01 DIAGNOSIS — T182XXA Foreign body in stomach, initial encounter: Secondary | ICD-10-CM | POA: Diagnosis not present

## 2023-02-01 DIAGNOSIS — K222 Esophageal obstruction: Secondary | ICD-10-CM | POA: Diagnosis not present

## 2023-02-01 DIAGNOSIS — R1310 Dysphagia, unspecified: Secondary | ICD-10-CM

## 2023-02-01 DIAGNOSIS — K2289 Other specified disease of esophagus: Secondary | ICD-10-CM | POA: Diagnosis not present

## 2023-02-01 DIAGNOSIS — T18128A Food in esophagus causing other injury, initial encounter: Secondary | ICD-10-CM | POA: Diagnosis not present

## 2023-02-01 DIAGNOSIS — K219 Gastro-esophageal reflux disease without esophagitis: Secondary | ICD-10-CM | POA: Diagnosis not present

## 2023-02-01 HISTORY — PX: ESOPHAGOGASTRODUODENOSCOPY (EGD) WITH PROPOFOL: SHX5813

## 2023-02-01 HISTORY — PX: BALLOON DILATION: SHX5330

## 2023-02-01 HISTORY — PX: BIOPSY: SHX5522

## 2023-02-01 SURGERY — ESOPHAGOGASTRODUODENOSCOPY (EGD) WITH PROPOFOL
Anesthesia: General

## 2023-02-01 MED ORDER — FENTANYL CITRATE (PF) 100 MCG/2ML IJ SOLN: 25.0000 ug | INTRAMUSCULAR | Status: DC | PRN

## 2023-02-01 MED ORDER — FENTANYL CITRATE (PF) 100 MCG/2ML IJ SOLN
INTRAMUSCULAR | Status: AC
  Filled 2023-02-01: qty 2

## 2023-02-01 MED ORDER — SUCCINYLCHOLINE CHLORIDE 200 MG/10ML IV SOSY
PREFILLED_SYRINGE | INTRAVENOUS | Status: DC | PRN
  Administered 2023-02-01: 180 mg via INTRAVENOUS

## 2023-02-01 MED ORDER — LACTATED RINGERS IV SOLN: INTRAVENOUS | Status: DC | PRN

## 2023-02-01 MED ORDER — LIDOCAINE 2% (20 MG/ML) 5 ML SYRINGE
INTRAMUSCULAR | Status: DC | PRN
  Administered 2023-02-01: 60 mg via INTRAVENOUS

## 2023-02-01 MED ORDER — PROPOFOL 10 MG/ML IV BOLUS
INTRAVENOUS | Status: DC | PRN
  Administered 2023-02-01: 180 mg via INTRAVENOUS

## 2023-02-01 MED ORDER — FENTANYL CITRATE (PF) 250 MCG/5ML IJ SOLN
INTRAMUSCULAR | Status: DC | PRN
  Administered 2023-02-01: 100 ug via INTRAVENOUS

## 2023-02-01 MED ORDER — PROPOFOL 500 MG/50ML IV EMUL
INTRAVENOUS | Status: DC | PRN
Start: 2023-02-01 — End: 2023-02-01
  Administered 2023-02-01: 140 ug/kg/min via INTRAVENOUS

## 2023-02-01 SURGICAL SUPPLY — 14 items

## 2023-02-01 NOTE — Op Note (Addendum)
[facility] Patient Name: Bobby Mcbride Procedure Date : 02/01/2023 MRN: [id number] Attending MD: Iva Boop , MD, [id number] Date of Birth: January 24, 1971 CSN: [id number] Age: 52 Admit Type: Outpatient Procedure:                Upper GI endoscopy Indications:              Dysphagia, suspected food impaction - had                            dysphagia/impaction symptoms with rce in chicken                            yesterday afternoon and then unable to swallow                            waffle and liquids aearly evening yesterday Providers:                Iva Boop, MD, Janae Sauce. Steele Berg, RN, Kandice Robinsons, Technician Referring MD:              Medicines:                General Anesthesia Complications:            No immediate complications. Estimated Blood Loss:     Estimated blood loss was minimal. Procedure:                Pre-Anesthesia Assessment:                           - Prior to the procedure, a History and Physical                            was performed, and patient medications and                            allergies were reviewed. The patient's tolerance of                            previous anesthesia was also reviewed. The risks                            and benefits of the procedure and the sedation                            options and risks were discussed with the patient.                            All questions were answered, and informed consent                            was obtained. Prior Anticoagulants: The patient has  taken no anticoagulant or antiplatelet agents. ASA                            Grade Assessment: II - A patient with mild systemic                            disease. After reviewing the risks and benefits,                            the patient was deemed in satisfactory condition to                            undergo the procedure.                           After  obtaining informed consent, the endoscope was                            passed under direct vision. Throughout the                            procedure, the patient's blood pressure, pulse, and                            oxygen saturations were monitored continuously. The                            [id number]-H190 ([id number]) Olympus endoscope was introduced                            through the mouth, and advanced to the second part                            of duodenum. The upper GI endoscopy was                            accomplished without difficulty. The patient                            tolerated the procedure well. Scope In: Scope Out: Findings:      One benign-appearing, intrinsic moderate (circumferential scarring or       stenosis; an endoscope may pass) stenosis was found in the distal       esophagus. The stenosis was traversed. A TTS dilator was passed through       the scope. Dilation with an 18-19-20 mm balloon dilator was performed to       20 mm. The dilation site was examined and showed mild mucosal       disruption. Estimated blood loss was minimal.      Mucosal changes characterized by congestion, erythema and longitudinal       markings were found in the lower third of the esophagus. Biopsies were       obtained from the proximal and distal esophagus with cold forceps for       histology of suspected eosinophilic esophagitis. Estimated  blood loss       was minimal.      A small amount of food (residue) was found in the gastric fundus.      The exam was otherwise without abnormality.      The cardia and gastric fundus were normal on retroflexion. Impression:               - Benign-appearing esophageal stenosis. Dilated. 20                            mm w/ mild mucosal disruption. Attempted 18 mm                            retrograde dilation pull back but balloon did not                            want to move proximally so stopped.                           -  Congested, erythematous, longitudinally marked                            mucosa in the esophagus. ? EOE vs changes from                            prior food impaction (he had clinical signs and                            symptoms prior to procedure though no food bolus                            seen in stomach - just a few pieces of vegetable                            material)                           - A small amount of food (residue) in the stomach.                           - The examination was otherwise normal.                           - Biopsies were taken with a cold forceps for                            evaluation of eosinophilic esophagitis. Not classic                            but distal esophagus raised some ?, in 2/23 he had                            > 25 Eos/hpf on distal esophageal bxs Recommendation:           - Patient has  a contact number available for                            emergencies. The signs and symptoms of potential                            delayed complications were discussed with the                            patient. Return to normal activities tomorrow.                            Written discharge instructions were provided to the                            patient.                           - Clear liquids x 1 hour then soft foods rest of                            day. Start prior diet tomorrow.                           - Continue present medications. Stay on                            pantoprazole 40 mg qd                           - Await pathology results.                           - If pathology suggests eosinophilic esophagitis                            then consider elimination of dairy and/or wheat                            from diet, possible Maloney dilation and possible                            topical steroid tx Procedure Code(s):        --- Professional ---                           [phone], Esophagogastroduodenoscopy,  flexible,                            transoral; with transendoscopic balloon dilation of                            esophagus (less than 30 mm diameter)                           43239, 59, Esophagogastroduodenoscopy, flexible,  transoral; with biopsy, single or multiple Diagnosis Code(s):        --- Professional ---                           K22.2, Esophageal obstruction                           K22.89, Other specified disease of esophagus                           R13.10, Dysphagia, unspecified CPT copyright 2022 [employer] Association. All rights reserved. The codes documented in this report are preliminary and upon coder review may  be revised to meet current compliance requirements. Iva Boop, MD 02/01/2023 1:16:24 AM This report has been signed electronically. Number of Addenda: 0

## 2023-02-01 NOTE — Anesthesia Procedure Notes (Signed)
Procedure Name: Intubation Date/Time: 02/01/2023 12:55 PM  Performed by: Samara Deist, CRNAPre-anesthesia Checklist: Patient identified, Emergency Drugs available, Suction available and Patient being monitored Patient Re-evaluated:Patient Re-evaluated prior to induction Oxygen Delivery Method: Circle System Utilized Preoxygenation: Pre-oxygenation with 100% oxygen Induction Type: IV induction and Rapid sequence Laryngoscope Size: Mac and 4 Grade View: Grade I Tube type: Oral Tube size: 7.5 mm Number of attempts: 1 Airway Equipment and Method: Stylet Placement Confirmation: ETT inserted through vocal cords under direct vision, positive ETCO2 and breath sounds checked- equal and bilateral Secured at: 21 cm Tube secured with: Tape Dental Injury: Teeth and Oropharynx as per pre-operative assessment

## 2023-02-01 NOTE — Discharge Instructions (Signed)
I did not find food still lodges in esophagus though it seems like it was earlier. I dilated the esophagus and took some biopsies to try to understand things better. My office will contact with results and plans.  YOU HAD AN ENDOSCOPIC PROCEDURE TODAY: Refer to the procedure report and other information in the discharge instructions given to you for any specific questions about what was found during the examination. If this information does not answer your questions, please call Dr. Marvell Fuller office at [phone] to clarify.   YOU SHOULD EXPECT: Some feelings of bloating in the abdomen. Passage of more gas than usual. Walking can help get rid of the air that was put into your GI tract during the procedure and reduce the bloating. If you had a lower endoscopy (such as a colonoscopy or flexible sigmoidoscopy) you may notice spotting of blood in your stool or on the toilet paper. Some abdominal soreness may be present for a day or two, also.  DIET:  Clear liquids only until 7 AM today - then you should try soft foods and move on to regular foods later today (lunchtime) as tolerated. Drink plenty of fluids but you should avoid alcoholic beverages for 24 hours.   ACTIVITY: Your care partner should take you home directly after the procedure. You should plan to take it easy, moving slowly for the rest of the day. You can resume normal activity the day after the procedure however YOU SHOULD NOT DRIVE, use power tools, machinery or perform tasks that involve climbing or major physical exertion for 24 hours (because of the sedation medicines used during the test).   SYMPTOMS TO REPORT IMMEDIATELY: A gastroenterologist can be reached at any hour. Please call [phone]  for any of the following symptoms:   Following upper endoscopy (EGD, EUS, ERCP, esophageal dilation) Vomiting of blood or coffee ground material  New, significant abdominal pain  New, significant chest pain or pain under the shoulder  blades  Painful or persistently difficult swallowing  New shortness of breath  Black, tarry-looking or red, bloody stools  FOLLOW UP:  If any biopsies were taken you will be contacted by phone or by letter within the next 1-3 weeks. Call [phone]  if you have not heard about the biopsies in 3 weeks.  Please also call with any specific questions about appointments or follow up tests.

## 2023-02-01 NOTE — Anesthesia Preprocedure Evaluation (Addendum)
Anesthesia Evaluation  Patient identified by MRN, date of birth, ID band Patient awake    Reviewed: Allergy & Precautions, NPO status , Patient's Chart, lab work & pertinent test results  Airway Mallampati: II  TM Distance: >3 FB Neck ROM: Full    Dental  (+) Dental Advisory Given   Pulmonary neg pulmonary ROS   breath sounds clear to auscultation       Cardiovascular negative cardio ROS  Rhythm:Regular     Neuro/Psych  Headaches  Anxiety      Neuromuscular disease    GI/Hepatic Neg liver ROS, hiatal hernia,GERD  Medicated,,Food impaction esophagus Hx/o esophageal stricture S/P dilation several times    Endo/Other  negative endocrine ROS    Renal/GU Renal diseaseRenal cyst     Musculoskeletal  (+) Arthritis ,    Abdominal   Peds  Hematology negative hematology ROS (+)   Anesthesia Other Findings   Reproductive/Obstetrics                             Anesthesia Physical Anesthesia Plan  ASA: 2 and emergent  Anesthesia Plan: General   Post-op Pain Management: Minimal or no pain anticipated   Induction: Intravenous, Rapid sequence and Cricoid pressure planned  PONV Risk Score and Plan: 2 and Ondansetron and Dexamethasone  Airway Management Planned: Oral ETT  Additional Equipment: None  Intra-op Plan:   Post-operative Plan: Extubation in OR  Informed Consent: I have reviewed the patients History and Physical, chart, labs and discussed the procedure including the risks, benefits and alternatives for the proposed anesthesia with the patient or authorized representative who has indicated his/her understanding and acceptance.     Dental advisory given  Plan Discussed with: CRNA  Anesthesia Plan Comments:         Anesthesia Quick Evaluation

## 2023-02-01 NOTE — Transfer of Care (Signed)
Immediate Anesthesia Transfer of Care Note  Patient: Bobby Mcbride  Procedure(s) Performed: ESOPHAGOGASTRODUODENOSCOPY (EGD) WITH PROPOFOL BIOPSY BALLOON DILATION  Patient Location: PACU  Anesthesia Type:General  Level of Consciousness: awake, alert , and oriented  Airway & Oxygen Therapy: Patient Spontanous Breathing and Patient connected to nasal cannula oxygen  Post-op Assessment: Report given to RN and Post -op Vital signs reviewed and stable  Post vital signs: Reviewed and stable  Last Vitals:  Vitals Value Taken Time  BP 107/59 02/01/23 0117  Temp 36.3 C 02/01/23 0117  Pulse 70 02/01/23 0117  Resp 23 02/01/23 0117  SpO2 95 % 02/01/23 0117    Last Pain:  Vitals:   02/01/23 0117  TempSrc:   PainSc: 0-No pain         Complications: No notable events documented.

## 2023-02-01 NOTE — H&P (Addendum)
[facility] History and Physical   Primary Care Physician:  Eden Emms, NP   Reason for Procedure:   Remove food impaction  Plan:    EGD and food impaction removal     HPI: BRIDGER REDDIC is a 52 y.o. male w/ hx of GERD and stricture. Had dysphagia w/ chicken and rice at [employer] early afternoon and some symptoms of possible obstruction of esophagus afterwards. Problems intensified after eating a waffle in the evening and unable to swallow liquids after that. Has constant pressure in chest consistent w/ past food impactions.  Last EGD w 05/2022 Yancey Flemings, MD) with GERD changes and esophageal stricture dilated to 20 mm.  In 04/2021 he had GERD changes and mild stricture - biopises of distal esophagus showed > 25 eosinophils per hpf but no proximal biopsies taken.  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 07/2022 Echo: EF 60-65%, no rwma, nl RV size/fxn.  No significant valvular disease.   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 per pt dx age 29; b. 07/2022 Echo: No MVP/MR.   Perianal lesion    Pre-syncope    a. 07/2022 Zio x 2 (total 4 wks): 1.  Predominantly sinus rhythm with average heart rate of 64 (38-144).  Rare PACs and PVCs. 2.  Predominantly sinus rhythm at average of 58 (37-99).  Rare PACs and PVCs.    Past Surgical History:   Procedure Laterality Date   APPENDECTOMY  1990   BIOPSY  09/03/2018   Procedure: BIOPSY;  Surgeon: Lynann Bologna, MD;  Location: [facility];  Service: Endoscopy;;   COLONOSCOPY WITH ESOPHAGOGASTRODUODENOSCOPY (EGD)  08/31/2021   by dr Christella Hartigan   ELBOW SURGERY Right 1987   ESOPHAGOGASTRODUODENOSCOPY  08/18/2011   Procedure: ESOPHAGOGASTRODUODENOSCOPY (EGD);  Surgeon: Beverley Fiedler, MD;  Location: [facility];  Service: Gastroenterology;  Laterality: N/A;   ESOPHAGOGASTRODUODENOSCOPY  11/11/2011   Procedure: ESOPHAGOGASTRODUODENOSCOPY (EGD);  Surgeon: Iva Boop, MD;  Location: [facility];  Service: Endoscopy;  Laterality: N/A;   ESOPHAGOGASTRODUODENOSCOPY (EGD) WITH PROPOFOL N/A 09/03/2018   Procedure: ESOPHAGOGASTRODUODENOSCOPY (EGD) WITH PROPOFOL;  Surgeon: Lynann Bologna, MD;  Location: [facility];  Service: Endoscopy;  Laterality: N/A;   ESOPHAGOGASTRODUODENOSCOPY (EGD) WITH PROPOFOL N/A 11/24/2021   Procedure: ESOPHAGOGASTRODUODENOSCOPY (EGD) WITH PROPOFOL;  Surgeon: Hilarie Fredrickson, MD;  Location: [facility];  Service: Gastroenterology;  Laterality: N/A;   KNEE ARTHROSCOPY Right 02/26/2009   @[facility]  by dr c. Magnus Ivan   MASS EXCISION N/A 10/15/2021   Procedure: EXCISION OF PERIANAL MASS;  Surgeon: Andria Meuse, MD;  Location: [facility];  Service: General;  Laterality: N/A;   MULTIPLE TOOTH EXTRACTIONS     several ---  w/ IV sedation  last extraction 05/ 2023  RECTAL EXAM UNDER ANESTHESIA N/A 10/15/2021   Procedure: ANORECTAL EXAM UNDER ANESTHESIA;  Surgeon: Andria Meuse, MD;  Location: [facility];  Service: General;  Laterality: N/A;   SHOULDER ARTHROSCOPY W/ ROTATOR CUFF REPAIR Right 2012   SKIN BIOPSY N/A 10/15/2021   Procedure: BIOPSY OF GLUTEAL SKIN;  Surgeon: Andria Meuse, MD;  Location: [facility];  Service: General;  Laterality: N/A;    Prior to Admission medications   Medication Sig Start Date End Date  Taking? Authorizing Provider  aspirin EC 81 MG tablet Take 1 tablet (81 mg total) by mouth daily. Swallow whole. 08/06/22   Kathlen Mody, MD  hydrocortisone (ANUSOL-HC) 2.5 % rectal cream Apply 1 Application topically daily as needed for hemorrhoids. 11/01/21   [provider]  methocarbamol (ROBAXIN) 750 MG tablet Take 1 tablet (750 mg total) by mouth 2 (two) times daily. 12/25/22   Elpidio Anis, PA-C  oxyCODONE-acetaminophen (PERCOCET/ROXICET) 5-325 MG tablet Take 1 tablet by mouth every 6 (six) hours as needed for severe pain. 12/25/22   Elpidio Anis, PA-C  pantoprazole (PROTONIX) 40 MG tablet Take 1 tablet (40 mg total) by mouth daily. 05/16/22   Hilarie Fredrickson, MD  predniSONE (DELTASONE) 10 MG tablet Take 6 on days one and two (12/26/22) Take 5 on days three and four Take 4 on days five and six Take 3 on days seven and eight Take 2 on days nine and ten Take 1 on days eleven and twelve 12/25/22   Upstill, Melvenia Beam, PA-C  pregabalin (LYRICA) 75 MG capsule Take 1 capsule (75 mg total) by mouth 2 (two) times daily. 12/21/22 01/20/23  London Sheer, MD    No current facility-administered medications for this encounter.   Current Outpatient Medications  Medication Sig Dispense Refill   aspirin EC 81 MG tablet Take 1 tablet (81 mg total) by mouth daily. Swallow whole. 30 tablet 1   hydrocortisone (ANUSOL-HC) 2.5 % rectal cream Apply 1 Application topically daily as needed for hemorrhoids.     methocarbamol (ROBAXIN) 750 MG tablet Take 1 tablet (750 mg total) by mouth 2 (two) times daily. 20 tablet 0   oxyCODONE-acetaminophen (PERCOCET/ROXICET) 5-325 MG tablet Take 1 tablet by mouth every 6 (six) hours as needed for severe pain. 20 tablet 0   pantoprazole (PROTONIX) 40 MG tablet Take 1 tablet (40 mg total) by mouth daily. 30 tablet 3   predniSONE (DELTASONE) 10 MG tablet Take 6 on days one and two (12/26/22) Take 5 on days three and four Take 4 on days five and six Take 3 on days seven and  eight Take 2 on days nine and ten Take 1 on days eleven and twelve 42 tablet 0   pregabalin (LYRICA) 75 MG capsule Take 1 capsule (75 mg total) by mouth 2 (two) times daily. 60 capsule 0    Allergies as of 01/31/2023 - Review Complete 01/31/2023  Allergen Reaction Noted   Codeine Other (See Comments)    Hydrocodone-acetaminophen Other (See Comments)    Food  10/18/2015   Ibuprofen  12/28/2016   Compazine [prochlorperazine edisylate] Anxiety 08/02/2011    Family History  Problem Relation Age of Onset   Hypertension Mother    Stroke Mother    Migraines Mother    Diabetes Mother    Prostate cancer Father    Hypertension Father    Heart failure Father    Asthma Father    Colon polyps Father    Heart attack Father  died in his 63's   Colon cancer Neg Hx    Esophageal cancer Neg Hx    Stomach cancer Neg Hx    Rectal cancer Neg Hx     Social History   Socioeconomic History   Marital status: Married    Spouse name: Vickie   Number of children: 0   Years of education: Not on file   Highest education level: Not on file  Occupational History   Occupation: IT sales professional, since 2002    Employer: [employer]  Tobacco Use   Smoking status: Never   Smokeless tobacco: Never  Vaping Use   Vaping status: Never Used  Substance and Sexual Activity   Alcohol use: No   Drug use: Never   Sexual activity: Not on file  Other Topics Concern   Not on file  Social History Narrative   Ship broker and [employer]   Social Determinants of Health   Financial Resource Strain: Not on file  Food Insecurity: No Food Insecurity (08/05/2022)   Hunger Vital Sign    Worried About Running Out of Food in the Last Year: Never true    Ran Out of Food in the Last Year: Never true  Transportation Needs: No Transportation Needs (08/05/2022)   PRAPARE - Administrator, Civil Service (Medical): No    Lack of Transportation (Non-Medical): No  Physical Activity:  Not on file  Stress: Not on file  Social Connections: Not on file  Intimate Partner Violence: Not At Risk (08/05/2022)   Humiliation, Afraid, Rape, and Kick questionnaire    Fear of Current or Ex-Partner: No    Emotionally Abused: No    Physically Abused: No    Sexually Abused: No    Review of Systems:  All other review of systems negative except as mentioned in the HPI.  Physical Exam: Vital signs BP (!) 143/93   Pulse 76   Temp 97.9 F (36.6 C) (Oral)   Resp 20   SpO2 99%   General:   Alert,  Well-developed, well-nourished, pleasant and cooperative in NAD Lungs:  Clear throughout to auscultation.   Heart:  Regular rate and rhythm; no murmurs, clicks, rubs,  or gallops. Abdomen:  Soft, nontender and nondistended. Normal bowel sounds.   Neuro/Psych:  Alert and cooperative. Normal mood and affect. A and O x 3   @Katiejo Gilroy  Sena Slate, MD, [facility] [phone] (pager) 02/01/2023 12:16 AM@

## 2023-02-01 NOTE — ED Notes (Signed)
Pt transported to endoscopy, CCMD notified.

## 2023-02-02 LAB — SURGICAL PATHOLOGY

## 2023-02-03 ENCOUNTER — Other Ambulatory Visit: Payer: Self-pay

## 2023-02-03 MED ORDER — PANTOPRAZOLE SODIUM 40 MG PO TBEC: 40.0000 mg | DELAYED_RELEASE_TABLET | Freq: Two times a day (BID) | ORAL | 6 refills | Status: DC

## 2023-02-05 ENCOUNTER — Encounter (HOSPITAL_COMMUNITY): Payer: Self-pay | Admitting: Internal Medicine

## 2023-02-05 NOTE — Anesthesia Postprocedure Evaluation (Signed)
Anesthesia Post Note  Patient: Bobby Mcbride  Procedure(s) Performed: ESOPHAGOGASTRODUODENOSCOPY (EGD) WITH PROPOFOL BIOPSY BALLOON DILATION     Patient location during evaluation: PACU Anesthesia Type: General Level of consciousness: awake and alert Pain management: pain level controlled Vital Signs Assessment: post-procedure vital signs reviewed and stable Respiratory status: spontaneous breathing, nonlabored ventilation and respiratory function stable Cardiovascular status: blood pressure returned to baseline and stable Postop Assessment: no apparent nausea or vomiting Anesthetic complications: no   No notable events documented.  Last Vitals:  Vitals:   02/01/23 0130 02/01/23 0145  BP: 100/69 106/80  Pulse: 70 66  Resp: (!) 22 17  Temp:  (!) 36.3 C  SpO2: 93%     Last Pain:  Vitals:   02/01/23 0145  TempSrc:   PainSc: 0-No pain                 Mikiala Fugett

## 2023-02-06 ENCOUNTER — Telehealth: Payer: Self-pay | Admitting: Internal Medicine

## 2023-02-06 NOTE — Telephone Encounter (Signed)
Patient called and stated he had an emergency procedure done with one of Dr. Marina Goodell colleagues. Patient also stated that he has had multiple esophagus Stretched and it has been successful for him and they mention to him last time that he could get a surgery done but is not familiar with they name. Patient is also requesting a call back. Please advise.

## 2023-02-06 NOTE — Telephone Encounter (Signed)
Pt states he was told he may need to have a surgical procedure that starts with a T for his reflux. Discussed with pt is was probably the TIF procedure. Pt verbalized understanding and will research this so he knows what all is involved with this procedure.

## 2023-02-07 ENCOUNTER — Other Ambulatory Visit (HOSPITAL_COMMUNITY): Payer: Self-pay

## 2023-02-10 ENCOUNTER — Telehealth: Payer: Self-pay | Admitting: Pharmacy Technician

## 2023-02-10 NOTE — Telephone Encounter (Signed)
Pharmacy Patient Advocate Encounter   Received notification from CoverMyMeds that prior authorization for PANTOPRAZOLE 40MG  is required/requested.   Insurance verification completed.   The patient is insured through [facility] .   Per test claim: PA required; PA submitted to above mentioned insurance via CoverMyMeds Key/confirmation #/EOC [id number] Status is pending

## 2023-02-16 ENCOUNTER — Other Ambulatory Visit (HOSPITAL_COMMUNITY): Payer: Self-pay

## 2023-02-16 MED ORDER — PANTOPRAZOLE SODIUM 40 MG PO TBEC: 40.0000 mg | DELAYED_RELEASE_TABLET | Freq: Two times a day (BID) | ORAL | 6 refills | Status: DC

## 2023-02-16 NOTE — Addendum Note (Signed)
Addended by: Jeanine Luz on: 02/16/2023 04:48 PM   Modules accepted: Orders

## 2023-02-16 NOTE — Telephone Encounter (Signed)
Pantoprazole approved and sent to pharmacy

## 2023-02-16 NOTE — Telephone Encounter (Signed)
Pharmacy Patient Advocate Encounter  Received notification from [facility] that Prior Authorization for PANTOPRAZOLE has been APPROVED from 02/10/23 to 02/10/2024. Ran test claim, Copay is $4.00. This test claim was processed through [facility]- copay amounts may vary at other pharmacies due to pharmacy/plan contracts, or as the patient moves through the different stages of their insurance plan.   PA #/Case ID/Reference #: [id number]

## 2023-03-09 ENCOUNTER — Encounter (HOSPITAL_BASED_OUTPATIENT_CLINIC_OR_DEPARTMENT_OTHER): Payer: Self-pay | Admitting: Emergency Medicine

## 2023-03-09 ENCOUNTER — Emergency Department (HOSPITAL_BASED_OUTPATIENT_CLINIC_OR_DEPARTMENT_OTHER): Payer: BC Managed Care – PPO | Admitting: Radiology

## 2023-03-09 ENCOUNTER — Emergency Department (HOSPITAL_BASED_OUTPATIENT_CLINIC_OR_DEPARTMENT_OTHER)
Admission: EM | Admit: 2023-03-09 | Discharge: 2023-03-09 | Disposition: A | Discharge status: Home / Self Care | Payer: BC Managed Care – PPO | Attending: Emergency Medicine | Admitting: Emergency Medicine

## 2023-03-09 ENCOUNTER — Other Ambulatory Visit: Payer: Self-pay

## 2023-03-09 DIAGNOSIS — Z7982 Long term (current) use of aspirin: Secondary | ICD-10-CM | POA: Diagnosis not present

## 2023-03-09 DIAGNOSIS — R509 Fever, unspecified: Secondary | ICD-10-CM | POA: Diagnosis not present

## 2023-03-09 DIAGNOSIS — B349 Viral infection, unspecified: Secondary | ICD-10-CM | POA: Insufficient documentation

## 2023-03-09 DIAGNOSIS — R059 Cough, unspecified: Secondary | ICD-10-CM | POA: Diagnosis not present

## 2023-03-09 DIAGNOSIS — Z1152 Encounter for screening for COVID-19: Secondary | ICD-10-CM | POA: Diagnosis not present

## 2023-03-09 LAB — RESP PANEL BY RT-PCR (RSV, FLU A&B, COVID)  RVPGX2
Influenza A by PCR: NEGATIVE
Influenza B by PCR: NEGATIVE
Resp Syncytial Virus by PCR: NEGATIVE
SARS Coronavirus 2 by RT PCR: NEGATIVE

## 2023-03-09 MED ORDER — ALBUTEROL SULFATE (2.5 MG/3ML) 0.083% IN NEBU
5.0000 mg | INHALATION_SOLUTION | Freq: Once | RESPIRATORY_TRACT | Status: AC
  Administered 2023-03-09: 5 mg via RESPIRATORY_TRACT
  Filled 2023-03-09: qty 6

## 2023-03-09 MED ORDER — BENZONATATE 100 MG PO CAPS
200.0000 mg | ORAL_CAPSULE | Freq: Once | ORAL | Status: AC
  Administered 2023-03-09: 200 mg via ORAL
  Filled 2023-03-09: qty 2

## 2023-03-09 NOTE — ED Notes (Signed)
Swab was done and sent to lab.

## 2023-03-09 NOTE — ED Notes (Signed)
Pt given discharge instructions. Opportunities given for questions. Pt verbalizes understanding. Stone,Heather R, RN 

## 2023-03-09 NOTE — ED Provider Notes (Signed)
[place] EMERGENCY DEPARTMENT AT [facility] Provider Note   CSN: [id number] Arrival date & time: 03/09/23  1027     History  Chief Complaint  Patient presents with   Cough    Bobby Mcbride is a 52 y.o. male.  52 year old male presents with 3 days of cough fever chills and congestion.  Patient states that his cough is been nonproductive and causing him to have chest tightness with it.  Denies any branch of pulmonary disease.  No vomiting or diarrhea appreciated.  No urinary symptoms.  No headache or photophobia.  Symptoms unresponsive to home medications       Home Medications Prior to Admission medications   Medication Sig Start Date End Date Taking? Authorizing Provider  aspirin EC 81 MG tablet Take 1 tablet (81 mg total) by mouth daily. Swallow whole. 08/06/22   Kathlen Mody, MD  hydrocortisone (ANUSOL-HC) 2.5 % rectal cream Apply 1 Application topically daily as needed for hemorrhoids. 11/01/21   [provider]  methocarbamol (ROBAXIN) 750 MG tablet Take 1 tablet (750 mg total) by mouth 2 (two) times daily. 12/25/22   Elpidio Anis, PA-C  oxyCODONE-acetaminophen (PERCOCET/ROXICET) 5-325 MG tablet Take 1 tablet by mouth every 6 (six) hours as needed for severe pain. 12/25/22   Elpidio Anis, PA-C  pantoprazole (PROTONIX) 40 MG tablet Take 1 tablet (40 mg total) by mouth 2 (two) times daily before a meal. 02/16/23   Hilarie Fredrickson, MD  predniSONE (DELTASONE) 10 MG tablet Take 6 on days one and two (12/26/22) Take 5 on days three and four Take 4 on days five and six Take 3 on days seven and eight Take 2 on days nine and ten Take 1 on days eleven and twelve 12/25/22   Upstill, Shari, PA-C  pregabalin (LYRICA) 75 MG capsule Take 1 capsule (75 mg total) by mouth 2 (two) times daily. 12/21/22 01/20/23  London Sheer, MD      Allergies    Codeine, Hydrocodone-acetaminophen, Food, Ibuprofen, and Compazine [prochlorperazine edisylate]    Review of Systems    Review of Systems  All other systems reviewed and are negative.   Physical Exam Updated Vital Signs BP (!) 144/83 (BP Location: Right Arm)   Pulse 95   Temp 98.7 F (37.1 C)   Resp 20   Ht 1.702 m (5\' 7" )   Wt 79.4 kg   SpO2 98%   BMI 27.41 kg/m  Physical Exam Vitals and nursing note reviewed.  Constitutional:      General: He is not in acute distress.    Appearance: Normal appearance. He is well-developed. He is not toxic-appearing.  HENT:     Head: Normocephalic and atraumatic.  Eyes:     General: Lids are normal.     Conjunctiva/sclera: Conjunctivae normal.     Pupils: Pupils are equal, round, and reactive to light.  Neck:     Thyroid: No thyroid mass.     Trachea: No tracheal deviation.  Cardiovascular:     Rate and Rhythm: Normal rate and regular rhythm.     Heart sounds: Normal heart sounds. No murmur heard.    No gallop.  Pulmonary:     Effort: Pulmonary effort is normal. No respiratory distress.     Breath sounds: No stridor. Examination of the right-upper field reveals decreased breath sounds. Examination of the left-upper field reveals decreased breath sounds. Decreased breath sounds present. No wheezing, rhonchi or rales.  Abdominal:     General: There is  no distension.     Palpations: Abdomen is soft.     Tenderness: There is no abdominal tenderness. There is no rebound.  Musculoskeletal:        General: No tenderness. Normal range of motion.     Cervical back: Normal range of motion and neck supple.  Skin:    General: Skin is warm and dry.     Findings: No abrasion or rash.  Neurological:     Mental Status: He is alert and oriented to person, place, and time. Mental status is at baseline.     GCS: GCS eye subscore is 4. GCS verbal subscore is 5. GCS motor subscore is 6.     Cranial Nerves: No cranial nerve deficit.     Sensory: No sensory deficit.     Motor: Motor function is intact.  Psychiatric:        Attention and Perception: Attention normal.         Speech: Speech normal.        Behavior: Behavior normal.     ED Results / Procedures / Treatments   Labs (all labs ordered are listed, but only abnormal results are displayed) Labs Reviewed  RESP PANEL BY RT-PCR (RSV, FLU A&B, COVID)  [id number]    EKG None  Radiology No results found.  Procedures Procedures    Medications Ordered in ED Medications  albuterol (PROVENTIL) (2.5 MG/3ML) 0.083% nebulizer solution 5 mg (has no administration in time range)  benzonatate (TESSALON) capsule 200 mg (has no administration in time range)    ED Course/ Medical Decision Making/ A&P                                 Medical Decision Making Amount and/or Complexity of Data Reviewed Radiology: ordered.  Risk Prescription drug management.   Patient is viral panel negative for COVID flu.  Chest x-ray per my interpretation is negative.  Patient given albuterol along with Jerilynn Som and feels better.  Suspect viral syndrome and will discharge        Final Clinical Impression(s) / ED Diagnoses Final diagnoses:  None    Rx / DC Orders ED Discharge Orders     None         Lorre Nick, MD 03/09/23 1245

## 2023-03-09 NOTE — ED Triage Notes (Signed)
Pt via pov from home with cough, fever, chills x 2 days. 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 Pt alert & oriented, nad noted.

## 2023-03-10 ENCOUNTER — Other Ambulatory Visit: Payer: Self-pay

## 2023-04-30 ENCOUNTER — Emergency Department (HOSPITAL_BASED_OUTPATIENT_CLINIC_OR_DEPARTMENT_OTHER): Payer: BC Managed Care – PPO

## 2023-04-30 ENCOUNTER — Other Ambulatory Visit: Payer: Self-pay

## 2023-04-30 ENCOUNTER — Encounter (HOSPITAL_BASED_OUTPATIENT_CLINIC_OR_DEPARTMENT_OTHER): Payer: Self-pay

## 2023-04-30 ENCOUNTER — Emergency Department (HOSPITAL_BASED_OUTPATIENT_CLINIC_OR_DEPARTMENT_OTHER)
Admission: EM | Admit: 2023-04-30 | Discharge: 2023-04-30 | Disposition: A | Discharge status: Home / Self Care | Payer: BC Managed Care – PPO | Attending: Emergency Medicine | Admitting: Emergency Medicine

## 2023-04-30 DIAGNOSIS — Z7982 Long term (current) use of aspirin: Secondary | ICD-10-CM | POA: Diagnosis not present

## 2023-04-30 DIAGNOSIS — K760 Fatty (change of) liver, not elsewhere classified: Secondary | ICD-10-CM | POA: Diagnosis not present

## 2023-04-30 DIAGNOSIS — S60413A Abrasion of left middle finger, initial encounter: Secondary | ICD-10-CM | POA: Diagnosis not present

## 2023-04-30 DIAGNOSIS — W228XXA Striking against or struck by other objects, initial encounter: Secondary | ICD-10-CM | POA: Insufficient documentation

## 2023-04-30 DIAGNOSIS — R112 Nausea with vomiting, unspecified: Secondary | ICD-10-CM | POA: Diagnosis not present

## 2023-04-30 DIAGNOSIS — R109 Unspecified abdominal pain: Secondary | ICD-10-CM | POA: Diagnosis not present

## 2023-04-30 DIAGNOSIS — K529 Noninfective gastroenteritis and colitis, unspecified: Secondary | ICD-10-CM | POA: Insufficient documentation

## 2023-04-30 DIAGNOSIS — M19042 Primary osteoarthritis, left hand: Secondary | ICD-10-CM | POA: Diagnosis not present

## 2023-04-30 DIAGNOSIS — S6992XA Unspecified injury of left wrist, hand and finger(s), initial encounter: Secondary | ICD-10-CM | POA: Diagnosis not present

## 2023-04-30 LAB — COMPREHENSIVE METABOLIC PANEL
ALT: 40 U/L (ref 0–44)
AST: 30 U/L (ref 15–41)
Albumin: 4.6 g/dL (ref 3.5–5.0)
Alkaline Phosphatase: 62 U/L (ref 38–126)
Anion gap: 10 (ref 5–15)
BUN: 12 mg/dL (ref 6–20)
CO2: 26 mmol/L (ref 22–32)
Calcium: 9.6 mg/dL (ref 8.9–10.3)
Chloride: 102 mmol/L (ref 98–111)
Creatinine, Ser: 1.16 mg/dL (ref 0.61–1.24)
GFR, Estimated: >60 mL/min (ref >60)
Glucose, Bld: 134 mg/dL — ABNORMAL HIGH (ref 70–99)
Potassium: 4.5 mmol/L (ref 3.5–5.1)
Sodium: 138 mmol/L (ref 135–145)
Total Bilirubin: 1.5 mg/dL — ABNORMAL HIGH (ref 0.0–1.2)
Total Protein: 7.6 g/dL (ref 6.5–8.1)

## 2023-04-30 LAB — CBC
HCT: 50.7 % (ref 39.0–52.0)
Hemoglobin: 17.9 g/dL — ABNORMAL HIGH (ref 13.0–17.0)
MCH: 29.9 pg (ref 26.0–34.0)
MCHC: 35.3 g/dL (ref 30.0–36.0)
MCV: 84.8 fL (ref 80.0–100.0)
Platelets: 229 10*3/uL (ref 150–400)
RBC: 5.98 MIL/uL — ABNORMAL HIGH (ref 4.22–5.81)
RDW: 13.2 % (ref 11.5–15.5)
WBC: 17.1 10*3/uL — ABNORMAL HIGH (ref 4.0–10.5)
nRBC: 0 % (ref 0.0–0.2)

## 2023-04-30 LAB — RESP PANEL BY RT-PCR (RSV, FLU A&B, COVID)  RVPGX2
Influenza A by PCR: NEGATIVE
Influenza B by PCR: NEGATIVE
Resp Syncytial Virus by PCR: NEGATIVE
SARS Coronavirus 2 by RT PCR: NEGATIVE

## 2023-04-30 LAB — LIPASE, BLOOD: Lipase: 10 U/L — ABNORMAL LOW (ref 11–51)

## 2023-04-30 MED ORDER — LOPERAMIDE HCL 2 MG PO CAPS: 2.0000 mg | ORAL_CAPSULE | Freq: Four times a day (QID) | ORAL | 0 refills | Status: DC | PRN

## 2023-04-30 MED ORDER — ONDANSETRON 4 MG PO TBDP: 4.0000 mg | ORAL_TABLET | Freq: Three times a day (TID) | ORAL | 0 refills | Status: DC | PRN

## 2023-04-30 MED ORDER — SODIUM CHLORIDE 0.9 % IV BOLUS
1500.0000 mL | Freq: Once | INTRAVENOUS | Status: AC
  Administered 2023-04-30: 1500 mL via INTRAVENOUS

## 2023-04-30 MED ORDER — ONDANSETRON HCL 4 MG/2ML IJ SOLN
4.0000 mg | Freq: Once | INTRAMUSCULAR | Status: AC | PRN
  Administered 2023-04-30: 4 mg via INTRAVENOUS
  Filled 2023-04-30: qty 2

## 2023-04-30 MED ORDER — IOHEXOL 300 MG/ML  SOLN
100.0000 mL | Freq: Once | INTRAMUSCULAR | Status: AC | PRN
  Administered 2023-04-30: 100 mL via INTRAVENOUS

## 2023-04-30 NOTE — ED Triage Notes (Signed)
 Pt POV with wife d/t N/V/D non stop since 1100 this morning.  Pt wife also wants [place] to look at left middle finger.  Pt only worried about N/V/D.

## 2023-04-30 NOTE — ED Notes (Signed)
 Pt voiced understanding of d/c paperwork. Work excuse provided to patient. Ambulated to d/c without any difficulty.

## 2023-04-30 NOTE — Discharge Instructions (Addendum)
 We evaluated you for your nausea, vomiting and diarrhea.  Your testing was reassuring including your CT scan.  We have prescribed you nausea medication and medicine for diarrhea.  Please be sure to stay hydrated.  Your symptoms are most likely due to a viral illness.  Please follow-up with your primary doctor.  If you have any new or worsening symptoms such as bloody stools, severe abdominal pain, lightheadedness or dizziness, uncontrolled vomiting, or any other concerning symptoms, then please return to the emergency department.

## 2023-04-30 NOTE — ED Provider Notes (Signed)
 [place] EMERGENCY DEPARTMENT AT [facility] Provider Note  CSN: [id number] Arrival date & time: 04/30/23 1911  Chief Complaint(s) Emesis  HPI Bobby Mcbride is a 53 y.o. male history of eosinophilic esophagitis presenting to the emergency department with nausea, vomiting, diarrhea.  He reports it began this morning, has numerous episodes of vomiting, yellow stool, no blood in the stool.  No fevers or chills.  He reports some mild diffuse abdominal pain.  No chest pain or shortness of breath.  No sore throat, runny nose, cough.  No hematemesis.  No sick contacts.  He also reports that he injured his left middle finger, slammed it in the fire truck door a couple days ago.  Wants [place] to check it out.  Has a wound.   Past Medical History 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   followed by dr Christella Hartigan   GERD (gastroesophageal reflux disease)    Headache(784.0)    Hemorrhoids    Hiatal hernia    History of echocardiogram    a. 07/2022 Echo: EF 60-65%, no rwma, nl RV size/fxn.  No significant valvular disease.   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 per pt dx age 60; b. 07/2022 Echo: No MVP/MR.   Perianal lesion    Pre-syncope    a. 07/2022 Zio x 2 (total 4 wks): 1.  Predominantly sinus rhythm with average heart rate of 64 (38-144).  Rare PACs and PVCs. 2.  Predominantly sinus rhythm at average of 58 (37-99).  Rare PACs and PVCs.   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 Authorizing Provider  loperamide (IMODIUM) 2 MG capsule Take 1 capsule (2 mg total) by mouth 4 (four) times daily as needed for diarrhea or loose stools. 04/30/23  Yes Lonell Grandchild, MD  ondansetron (ZOFRAN-ODT) 4 MG disintegrating tablet Take 1 tablet (4 mg total) by mouth every 8 (eight) hours as needed for nausea or vomiting. 04/30/23  Yes Lonell Grandchild, MD  aspirin EC 81 MG tablet Take 1 tablet (81 mg total) by mouth daily. Swallow whole. 08/06/22   Kathlen Mody, MD  hydrocortisone (ANUSOL-HC) 2.5 % rectal cream Apply 1 Application topically daily as needed for hemorrhoids. 11/01/21  [provider]  methocarbamol (ROBAXIN) 750 MG tablet Take 1 tablet (750 mg total) by mouth 2 (two) times daily. 12/25/22   Elpidio Anis, PA-C  oxyCODONE-acetaminophen (PERCOCET/ROXICET) 5-325 MG tablet Take 1 tablet by mouth every 6 (six) hours as needed for severe pain. 12/25/22   Elpidio Anis, PA-C  pantoprazole (PROTONIX) 40 MG tablet Take 1 tablet (40 mg total) by mouth 2 (two) times daily before a meal. 02/16/23   Hilarie Fredrickson, MD  predniSONE (DELTASONE) 10 MG tablet Take 6 on days one and two (12/26/22) Take 5 on days three and four Take 4 on days five and six Take 3 on days seven and eight  Take 2 on days nine and ten Take 1 on days eleven and twelve 12/25/22   Upstill, Shari, PA-C  pregabalin (LYRICA) 75 MG capsule Take 1 capsule (75 mg total) by mouth 2 (two) times daily. 12/21/22 01/20/23  London Sheer, MD                                                                                                                                    Past Surgical History Past Surgical History:  Procedure Laterality Date   APPENDECTOMY  1990   BALLOON DILATION N/A 02/01/2023   Procedure: BALLOON DILATION;  Surgeon: Iva Boop, MD;  Location: [facility] ENDOSCOPY;  Service: Gastroenterology;  Laterality: N/A;   BIOPSY  09/03/2018   Procedure: BIOPSY;  Surgeon: Lynann Bologna, MD;  Location: [facility] ENDOSCOPY;  Service: Endoscopy;;   BIOPSY  02/01/2023   Procedure: BIOPSY;  Surgeon: Iva Boop, MD;  Location: [facility] ENDOSCOPY;  Service: Gastroenterology;;   COLONOSCOPY WITH ESOPHAGOGASTRODUODENOSCOPY (EGD)  08/31/2021   by dr Christella Hartigan   ELBOW SURGERY Right 1987   ESOPHAGOGASTRODUODENOSCOPY  08/18/2011   Procedure: ESOPHAGOGASTRODUODENOSCOPY (EGD);  Surgeon: Beverley Fiedler, MD;  Location: [facility] ENDOSCOPY;  Service: Gastroenterology;  Laterality: N/A;   ESOPHAGOGASTRODUODENOSCOPY  11/11/2011   Procedure: ESOPHAGOGASTRODUODENOSCOPY (EGD);  Surgeon: Iva Boop, MD;  Location: [facility] ENDOSCOPY;  Service: Endoscopy;  Laterality: N/A;   ESOPHAGOGASTRODUODENOSCOPY (EGD) WITH PROPOFOL N/A 09/03/2018   Procedure: ESOPHAGOGASTRODUODENOSCOPY (EGD) WITH PROPOFOL;  Surgeon: Lynann Bologna, MD;  Location: [facility] ENDOSCOPY;  Service: Endoscopy;  Laterality: N/A;   ESOPHAGOGASTRODUODENOSCOPY (EGD) WITH PROPOFOL N/A 11/24/2021   Procedure: ESOPHAGOGASTRODUODENOSCOPY (EGD) WITH PROPOFOL;  Surgeon: Hilarie Fredrickson, MD;  Location: [facility] ENDOSCOPY;  Service: Gastroenterology;  Laterality: N/A;   ESOPHAGOGASTRODUODENOSCOPY (EGD) WITH PROPOFOL N/A 02/01/2023   Procedure: ESOPHAGOGASTRODUODENOSCOPY (EGD) WITH PROPOFOL;  Surgeon: Iva Boop, MD;  Location: [facility] ENDOSCOPY;  Service: Gastroenterology;  Laterality: N/A;   KNEE ARTHROSCOPY Right 02/26/2009   @[facility]  by dr c. blackman   MASS EXCISION N/A 10/15/2021   Procedure: EXCISION OF PERIANAL MASS;  Surgeon: Andria Meuse, MD;  Location: [facility] [place];  Service: General;  Laterality: N/A;   MULTIPLE TOOTH EXTRACTIONS     several ---  w/ IV  sedation  last extraction 05/ 2023   RECTAL EXAM UNDER ANESTHESIA N/A 10/15/2021   Procedure: ANORECTAL EXAM UNDER ANESTHESIA;  Surgeon: Andria Meuse, MD;  Location: [facility];  Service: General;  Laterality: N/A;   SHOULDER ARTHROSCOPY W/ ROTATOR CUFF REPAIR Right 2012   SKIN BIOPSY N/A 10/15/2021   Procedure: BIOPSY OF GLUTEAL SKIN;  Surgeon: Andria Meuse, MD;  Location: [facility];  Service: General;  Laterality: N/A;   Family History Family History  Problem Relation Age of Onset   Hypertension Mother    Stroke Mother    Migraines Mother    Diabetes Mother    Prostate cancer Father    Hypertension Father    Heart failure Father    Asthma Father    Colon polyps Father    Heart attack Father        died in his 42's   Colon cancer Neg Hx    Esophageal cancer Neg Hx    Stomach cancer Neg Hx    Rectal cancer Neg Hx     Social History Social History   Tobacco Use   Smoking status: Never   Smokeless tobacco: Never  Vaping Use   Vaping status: Never Used  Substance Use Topics   Alcohol use: No   Drug use: Never   Allergies Codeine, Hydrocodone-acetaminophen, Food, Ibuprofen, and Compazine [prochlorperazine edisylate]  Review of Systems Review of Systems  All other systems reviewed and are negative.   Physical Exam Vital Signs  I have reviewed the triage vital signs BP 119/70   Pulse 99   Temp 98 F (36.7 C) (Oral)   Resp 20   Ht 5\' 7"  (1.702 m)   Wt 79.4 kg   SpO2 99%   BMI 27.41 kg/m  Physical Exam Vitals and nursing note reviewed.   Constitutional:      General: He is not in acute distress.    Appearance: Normal appearance.  HENT:     Mouth/Throat:     Mouth: Mucous membranes are dry.  Eyes:     Conjunctiva/sclera: Conjunctivae normal.  Cardiovascular:     Rate and Rhythm: Normal rate and regular rhythm.  Pulmonary:     Effort: Pulmonary effort is normal. No respiratory distress.     Breath sounds: Normal breath sounds.  Abdominal:     General: Abdomen is flat.     Palpations: Abdomen is soft.     Tenderness: There is abdominal tenderness (mild diffuse).  Musculoskeletal:     Right lower leg: No edema.     Left lower leg: No edema.     Comments: Left middle finger with abrasion to the dorsum over the distal phalanx, tenderness over this area, no significant swelling or bruising, no obvious deformity.  Skin:    General: Skin is warm and dry.     Capillary Refill: Capillary refill takes less than 2 seconds.  Neurological:     Mental Status: He is alert and oriented to person, place, and time. Mental status is at baseline.  Psychiatric:        Mood and Affect: Mood normal.        Behavior: Behavior normal.     ED Results and Treatments Labs (all labs ordered are listed, but only abnormal results are displayed) Labs Reviewed  LIPASE, BLOOD - Abnormal; Notable for the following components:      Result Value   Lipase 10 (*)    All other components within normal limits  COMPREHENSIVE METABOLIC PANEL - Abnormal; Notable for the following components:   Glucose, Bld 134 (*)    Total Bilirubin 1.5 (*)    All other components within normal limits  CBC - Abnormal; Notable for the following components:   WBC 17.1 (*)    RBC 5.98 (*)    Hemoglobin 17.9 (*)    All other components within normal limits  RESP PANEL BY RT-PCR (RSV, FLU A&B, COVID)  RVPGX2  URINALYSIS, ROUTINE W REFLEX MICROSCOPIC                                                                                                                           Radiology CT ABDOMEN PELVIS W CONTRAST Result Date: 04/30/2023 CLINICAL DATA:  Abdominal pain, acute, nonlocalized EXAM: CT ABDOMEN AND PELVIS WITH CONTRAST TECHNIQUE: Multidetector CT imaging of the abdomen and pelvis was performed using the standard protocol following bolus administration of intravenous contrast. RADIATION DOSE REDUCTION: This exam was performed according to the departmental dose-optimization program which includes automated exposure control, adjustment of the mA and/or kV according to patient size and/or use of iterative reconstruction technique. CONTRAST:  OMNIPAQUE IOHEXOL 300 MG/ML  SOLN COMPARISON:  08/11/2021 FINDINGS: Lower chest: Included lung bases are clear.  Heart size is normal. Hepatobiliary: Diffusely decreased attenuation of the hepatic parenchyma. No focal liver lesion is identified. Unremarkable gallbladder. No hyperdense gallstone. No biliary dilatation. Pancreas: Unremarkable. No pancreatic ductal dilatation or surrounding inflammatory changes. Spleen: Normal in size without focal abnormality. Adrenals/Urinary Tract: Unremarkable adrenal glands. Kidneys enhance symmetrically without focal lesion, stone, or hydronephrosis. Ureters are nondilated. Urinary bladder appears unremarkable for the degree of distention. Stomach/Bowel: Stomach is within normal limits. Appendix is surgically absent. Multiple fluid-filled nondilated loops of small bowel and small amount of liquid stool in the colon. No evidence of bowel wall thickening, distention, or inflammatory changes. Vascular/Lymphatic: No significant vascular findings are present. No enlarged abdominal or pelvic lymph nodes. Reproductive: Prostate is unremarkable. Other: No free fluid. No abdominopelvic fluid collection. No pneumoperitoneum. No abdominal wall hernia. Musculoskeletal: No acute or significant osseous findings. IMPRESSION: 1. Multiple fluid-filled non-dilated loops of small bowel and small amount of liquid  stool in the colon. Appearance suggest a nonspecific enteritis/diarrheal illness. 2. Hepatic steatosis. Electronically Signed   By: Duanne Guess D.O.   On: 04/30/2023 21:38   DG Hand Complete Left Result Date: 04/30/2023 CLINICAL DATA:  Left middle finger injury, closed in door. EXAM: LEFT HAND - COMPLETE 3+ VIEW COMPARISON:  None Available. FINDINGS: No acute bony abnormality. Specifically, no fracture, subluxation, or dislocation. Mild osteoarthritic changes in the IP joints, most pronounced in the left middle finger DIP joint. No radiopaque foreign bodies. Soft tissues are intact. IMPRESSION: No acute bony abnormality. Electronically Signed   By: Charlett Nose M.D.   On: 04/30/2023 21:22    Pertinent labs & imaging results that were available during my care of the patient were reviewed by me and considered in my  medical decision making (see MDM for details).  Medications Ordered in ED Medications  ondansetron (ZOFRAN) injection 4 mg (4 mg Intravenous Given 04/30/23 1932)  sodium chloride 0.9 % bolus 1,500 mL (1,500 mLs Intravenous New Bag/Given 04/30/23 2116)  iohexol (OMNIPAQUE) 300 MG/ML solution 100 mL (100 mLs Intravenous Contrast Given 04/30/23 2121)                                                                                                                                     Procedures Procedures  (including critical care time)  Medical Decision Making / ED Course   MDM:  53 year old presenting to the emergency department with abdominal pain, nausea, vomiting, diarrhea.  Patient appears dehydrated, has some abdominal tenderness on exam.  Also has a tenderness to his left middle finger from recent injury.  Differential includes gastroenteritis, colitis, appendicitis, volvulus, obstruction, perforation, abscess, viral illness.  Given tenderness, will obtain CT abdomen.  Labs notable for elevated white blood cell count although hemoglobin is also elevated so this may be a  reflection of his dehydration.  Will give IV fluids.  Patient declines any pain medication at this time.  Patient also had recent finger injury.  Will obtain x-ray.  Clinical Course as of 04/30/23 2304  Wynelle Link Apr 30, 2023  2303 CT scan shows fluid-filled bowel loops and liquid stool in the colon consistent with enteritis/diarrheal illness.  No evidence of acute surgical process or dangerous infectious process.  Patient feeling better with IV fluids and Zofran.  Will prescribe Zofran and Imodium to go home with.  Provided with work note. Will discharge patient to home. All questions answered. Patient comfortable with plan of discharge. Return precautions discussed with patient and specified on the after visit summary.  [WS]    Clinical Course User Index [WS] Lonell Grandchild, MD     Additional history obtained: -Additional history obtained from spouse -External records from outside source obtained and reviewed including: Chart review including previous notes, labs, imaging, consultation notes including prior notes    Lab Tests: -I ordered, reviewed, and interpreted labs.   The pertinent results include:   Labs Reviewed  LIPASE, BLOOD - Abnormal; Notable for the following components:      Result Value   Lipase 10 (*)    All other components within normal limits  COMPREHENSIVE METABOLIC PANEL - Abnormal; Notable for the following components:   Glucose, Bld 134 (*)    Total Bilirubin 1.5 (*)    All other components within normal limits  CBC - Abnormal; Notable for the following components:   WBC 17.1 (*)    RBC 5.98 (*)    Hemoglobin 17.9 (*)    All other components within normal limits  RESP PANEL BY RT-PCR (RSV, FLU A&B, COVID)  RVPGX2  URINALYSIS, ROUTINE W REFLEX MICROSCOPIC    Notable for leukocytosis   EKG   EKG Interpretation Date/Time:  Sunday April 30 2023 19:29:40 EST Ventricular Rate:  107 PR Interval:  124 QRS Duration:  76 QT Interval:  318 QTC  Calculation: 424 R Axis:   -46  Text Interpretation: Sinus tachycardia Left anterior fascicular block Cannot rule out Anterior infarct , age undetermined Abnormal ECG When compared with ECG of 28-Dec-2022 11:18, Vent. rate has increased BY  55 BPM Confirmed by Alvino Blood ([id number]) on 04/30/2023 8:34:22 PM         Imaging Studies ordered: I ordered imaging studies including CT abdomen On my interpretation imaging demonstrates diarrheal illness I independently visualized and interpreted imaging. I agree with the radiologist interpretation   Medicines ordered and prescription drug management: Meds ordered this encounter  Medications   ondansetron (ZOFRAN) injection 4 mg   sodium chloride 0.9 % bolus 1,500 mL   iohexol (OMNIPAQUE) 300 MG/ML solution 100 mL   ondansetron (ZOFRAN-ODT) 4 MG disintegrating tablet    Sig: Take 1 tablet (4 mg total) by mouth every 8 (eight) hours as needed for nausea or vomiting.    Dispense:  16 tablet    Refill:  0   loperamide (IMODIUM) 2 MG capsule    Sig: Take 1 capsule (2 mg total) by mouth 4 (four) times daily as needed for diarrhea or loose stools.    Dispense:  12 capsule    Refill:  0    -I have reviewed the patients home medicines and have made adjustments as needed   Reevaluation: After the interventions noted above, I reevaluated the patient and found that their symptoms have improved  Co morbidities that complicate the patient evaluation  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   followed by dr Christella Hartigan   GERD (gastroesophageal reflux disease)    Headache(784.0)    Hemorrhoids    Hiatal hernia    History of echocardiogram    a. 07/2022 Echo: EF 60-65%, no rwma, nl RV size/fxn.  No significant valvular disease.   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 per pt dx age 1; b. 07/2022 Echo: No MVP/MR.   Perianal lesion    Pre-syncope    a. 07/2022 Zio x 2 (total 4 wks): 1.  Predominantly sinus rhythm with average heart rate of 64 (38-144).  Rare PACs and PVCs. 2.  Predominantly sinus rhythm at average of 58 (37-99).  Rare PACs and PVCs.      Dispostion: Disposition decision including need for hospitalization was considered, and patient discharged from emergency department.    Final Clinical Impression(s) / ED Diagnoses Final diagnoses:  Gastroenteritis     This chart was dictated using voice recognition software.  Despite best efforts to proofread,  errors can occur which can change the documentation meaning.    Lonell Grandchild, MD 04/30/23 [phone]

## 2023-04-30 NOTE — ED Notes (Signed)
 Pt unable to provide a urine sample at this time.

## 2023-05-04 ENCOUNTER — Ambulatory Visit: Payer: BC Managed Care – PPO | Admitting: Internal Medicine

## 2023-05-04 ENCOUNTER — Encounter: Payer: Self-pay | Admitting: Internal Medicine

## 2023-05-04 VITALS — BP 118/80 | HR 84 | Ht 67.0 in | Wt 169.0 lb

## 2023-05-04 DIAGNOSIS — K2100 Gastro-esophageal reflux disease with esophagitis, without bleeding: Secondary | ICD-10-CM | POA: Diagnosis not present

## 2023-05-04 DIAGNOSIS — K222 Esophageal obstruction: Secondary | ICD-10-CM

## 2023-05-04 DIAGNOSIS — K219 Gastro-esophageal reflux disease without esophagitis: Secondary | ICD-10-CM

## 2023-05-04 DIAGNOSIS — R1310 Dysphagia, unspecified: Secondary | ICD-10-CM

## 2023-05-04 DIAGNOSIS — Z91128 Patient's intentional underdosing of medication regimen for other reason: Secondary | ICD-10-CM | POA: Diagnosis not present

## 2023-05-04 MED ORDER — PANTOPRAZOLE SODIUM 40 MG PO TBEC: 40.0000 mg | DELAYED_RELEASE_TABLET | Freq: Two times a day (BID) | ORAL | 3 refills | Status: DC

## 2023-05-04 NOTE — Patient Instructions (Signed)
 We have sent the following medications to your pharmacy for you to pick up at your convenience:  Protonix  You have been scheduled for an endoscopy. Please follow written instructions given to you at your visit today.  If you use inhalers (even only as needed), please bring them with you on the day of your procedure.  If you take any of the following medications, they will need to be adjusted prior to your procedure:   DO NOT TAKE 7 DAYS PRIOR TO TEST- Trulicity (dulaglutide) Ozempic, Wegovy (semaglutide) Mounjaro (tirzepatide) Bydureon Bcise (exanatide extended release)  DO NOT TAKE 1 DAY PRIOR TO YOUR TEST Rybelsus (semaglutide) Adlyxin (lixisenatide) Victoza (liraglutide) Byetta (exanatide) ___________________________________________________________________________

## 2023-05-04 NOTE — Progress Notes (Signed)
 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  He presents today with his wife regarding ongoing problems with dysphagia.  Prior patient of Dr. Rob Bunting.  The patient has a history of GERD complicated by erosive esophagitis and peptic stricture with recurrent transient food impactions which have brought him to the emergency room.  His last such episode was February 01, 2023 when he underwent upper endoscopy with Dr. Leone Payor.  In addition to esophagitis and peptic stricture esophageal biopsies were taken.  The stricture was balloon dilated to 20 mm.  Biopsies were most consistent with reflux esophagitis.  The patient tells me that he has had ongoing problems with intermittent solid food dysphagia.  He describes himself as frustrated and depressed.  He wonders if he is going to have this problem "the rest of his life".  He was prescribed pantoprazole 40 mg twice daily.  However, he tells me that he is taking this once daily approximately 2-3 times per week.  Does have intermittent reflux symptoms.  Also, he is partially edentulous and cannot chew his food particularly well.  Patient has undergone prior colonoscopy in June 2023.  Examination was normal.  Follow-up in 10 years recommended.  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 07/2022 Echo: EF 60-65%, no rwma, nl RV size/fxn.  No significant valvular disease.   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 per pt dx age 74; b. 07/2022 Echo: No MVP/MR.   Perianal lesion    Pre-syncope    a. 07/2022 Zio x 2 (total 4 wks): 1.  Predominantly sinus rhythm with average heart rate of 64 (38-144).  Rare PACs and PVCs. 2.  Predominantly sinus rhythm at average of 58 (37-99).  Rare PACs and PVCs.    Past Surgical History:  Procedure Laterality Date   APPENDECTOMY  1990   BALLOON DILATION N/A 02/01/2023   Procedure: BALLOON DILATION;  Surgeon: Iva Boop, MD;  Location: [facility] ENDOSCOPY;  Service: Gastroenterology;  Laterality: N/A;   BIOPSY  09/03/2018   Procedure: BIOPSY;  Surgeon: Lynann Bologna, MD;  Location: [facility] ENDOSCOPY;  Service: Endoscopy;;   BIOPSY  02/01/2023   Procedure: BIOPSY;  Surgeon: Iva Boop, MD;  Location: [facility] ENDOSCOPY;  Service: Gastroenterology;;   COLONOSCOPY WITH ESOPHAGOGASTRODUODENOSCOPY (EGD)  08/31/2021   by dr Christella Hartigan   ELBOW SURGERY Right 1987   ESOPHAGOGASTRODUODENOSCOPY  08/18/2011   Procedure: ESOPHAGOGASTRODUODENOSCOPY (EGD);  Surgeon: Beverley Fiedler, MD;  Location: [facility] ENDOSCOPY;  Service: Gastroenterology;  Laterality: N/A;   ESOPHAGOGASTRODUODENOSCOPY  11/11/2011   Procedure: ESOPHAGOGASTRODUODENOSCOPY (EGD);  Surgeon: Iva Boop, MD;  Location: [facility] ENDOSCOPY;  Service: Endoscopy;  Laterality: N/A;   ESOPHAGOGASTRODUODENOSCOPY (EGD) WITH PROPOFOL N/A 09/03/2018   Procedure: ESOPHAGOGASTRODUODENOSCOPY (EGD) WITH PROPOFOL;  Surgeon: Lynann Bologna, MD;  Location: [facility] ENDOSCOPY;  Service: Endoscopy;  Laterality: N/A;   ESOPHAGOGASTRODUODENOSCOPY (EGD) WITH PROPOFOL N/A 11/24/2021   Procedure: ESOPHAGOGASTRODUODENOSCOPY (EGD) WITH PROPOFOL;  Surgeon: Hilarie Fredrickson, MD;  Location: [facility] ENDOSCOPY;  Service: Gastroenterology;  Laterality: N/A;   ESOPHAGOGASTRODUODENOSCOPY  (EGD) WITH PROPOFOL N/A 02/01/2023   Procedure: ESOPHAGOGASTRODUODENOSCOPY (EGD) WITH PROPOFOL;  Surgeon: Iva Boop, MD;  Location: [facility] ENDOSCOPY;  Service: Gastroenterology;  Laterality: N/A;   KNEE ARTHROSCOPY Right 02/26/2009   @[facility]  by dr c. blackman   MASS EXCISION N/A 10/15/2021   Procedure: EXCISION OF PERIANAL MASS;  Surgeon: Andria Meuse, MD;  Location: [facility];  Service: General;  Laterality: N/A;   MULTIPLE TOOTH EXTRACTIONS     several ---  w/ IV sedation  last extraction 05/ 2023   RECTAL EXAM UNDER ANESTHESIA N/A 10/15/2021   Procedure: ANORECTAL EXAM UNDER ANESTHESIA;  Surgeon: Andria Meuse, MD;  Location: [facility];  Service: General;  Laterality: N/A;   SHOULDER ARTHROSCOPY W/ ROTATOR CUFF REPAIR Right 2012   SKIN BIOPSY N/A 10/15/2021   Procedure: BIOPSY OF GLUTEAL SKIN;  Surgeon: Andria Meuse, MD;  Location: [facility];  Service: General;  Laterality: N/A;    Social History Bobby Mcbride  reports that he has never smoked. He has never used smokeless tobacco. He reports that he does not drink alcohol and does not use drugs.  family history includes Asthma in his father; Colon polyps in his father; Diabetes in his mother; Heart attack in his father; Heart failure in his father; Hypertension in his father and mother; Migraines in his mother; Prostate cancer in his father; Stroke in his mother.  Allergies  Allergen Reactions   Codeine Other (See Comments)    Hallucinations   Hydrocodone-Acetaminophen Other (See Comments)    Hallucinations   Food     Pepperoni-nausea/vomiting Lettuce-nausea/vomiting Oranges-nausea/vomiting Cheese-nausea/vomiting   Ibuprofen     "ulcers in my stomach"   Compazine [Prochlorperazine Edisylate] Anxiety       PHYSICAL EXAMINATION: VariancesVital signs: BP 118/80   Pulse 84   Ht 5\' 7"  (1.702 m)   Wt 169 lb (76.7 kg)   BMI 26.47 kg/m   Constitutional:  generally well-appearing, no acute distress Psychiatric: alert and oriented x 3, cooperative Eyes: extraocular movements intact, anicteric, conjunctiva pink Mouth: oral pharynx moist, no lesions, multiple teeth missing Neck: supple no lymphadenopathy Cardiovascular: heart regular rate and rhythm, no murmur Lungs: clear to auscultation bilaterally Abdomen: soft, nontender, nondistended, no obvious ascites, no peritoneal signs, normal bowel sounds, no organomegaly Rectal: Omitted Extremities: no clubbing, cyanosis, or lower extremity edema bilaterally Skin: no lesions on visible extremities Neuro: No focal deficits.  Cranial nerves intact  ASSESSMENT:  1.  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 2.  Intermittent solid food dysphagia.  Ongoing 3.  Medical noncompliance 4.  Normal colonoscopy 2023   PLAN:  1.  Reflux precautions 2.  Prescribe pantoprazole 40 mg p.o. twice daily.  Strictly advised to take the medicine as prescribed.  Medication risks reviewed 3.  Schedule for endoscopy with repeat esophageal dilation.The nature of the procedure, as well as the risks, benefits, and alternatives were carefully and thoroughly reviewed with the patient. Ample time for discussion and questions allowed. The patient understood, was satisfied, and agreed to proceed. 4.  Consider dentures 5.  Screening colonoscopy around 2033 6.  Office follow-up after the above to monitor response to therapies.  Total time of 45 minutes was spent preparing to see the patient, obtaining interval history, performing medically appropriate physical exam, counseling and educating the patient and his wife regarding the above listed issues, ordering medication, ordering therapeutic endoscopic procedure, and documenting clinical information in the health record

## 2023-05-15 ENCOUNTER — Telehealth: Payer: Self-pay | Admitting: Internal Medicine

## 2023-05-15 NOTE — Telephone Encounter (Signed)
 Patient called and stated that he was wondering if we had received a response from [facility] regarding his EGD that was denied for the 5th of march. Patient stated that he was told it would take up to 6 days, but was just wondering if they had responded to the appeal. Patient is requesting a call back. Please advise.

## 2023-05-15 NOTE — Telephone Encounter (Signed)
 April, it looks like you worked on this [place]? Will you please update patient once you have a response? Thanks

## 2023-05-16 NOTE — Telephone Encounter (Signed)
 Called patient and rescheduled him for March 19 th at 1 PM. Please advise.

## 2023-05-16 NOTE — Telephone Encounter (Signed)
 Misty Stanley, please contact patient to reschedule EGD with Dr. Marina Goodell. Thanks

## 2023-05-16 NOTE — Telephone Encounter (Signed)
 Noted, thanks!

## 2023-05-16 NOTE — Telephone Encounter (Signed)
 McPeak, April D  Perry, John N, MD; Chrystie Nose, RN; Marisa Sprinkles, RN Finally approved!!!!  Order ID: [id number]       Authorized Approval Valid Through: 05/15/2023 - 06/13/2023

## 2023-05-17 ENCOUNTER — Encounter: Payer: BC Managed Care – PPO | Admitting: Internal Medicine

## 2023-05-24 ENCOUNTER — Encounter: Payer: Self-pay | Admitting: Internal Medicine

## 2023-05-24 ENCOUNTER — Ambulatory Visit (AMBULATORY_SURGERY_CENTER)

## 2023-05-24 VITALS — Ht 67.0 in | Wt 170.0 lb

## 2023-05-24 DIAGNOSIS — K219 Gastro-esophageal reflux disease without esophagitis: Secondary | ICD-10-CM

## 2023-05-24 DIAGNOSIS — R1310 Dysphagia, unspecified: Secondary | ICD-10-CM

## 2023-05-24 NOTE — Progress Notes (Signed)

## 2023-05-25 ENCOUNTER — Encounter: Payer: Self-pay | Admitting: Internal Medicine

## 2023-05-31 ENCOUNTER — Encounter: Payer: Self-pay | Admitting: Internal Medicine

## 2023-05-31 ENCOUNTER — Ambulatory Visit: Admitting: Internal Medicine

## 2023-05-31 VITALS — BP 109/66 | HR 93 | Temp 98.3°F | Resp 21 | Ht 67.0 in | Wt 170.0 lb

## 2023-05-31 DIAGNOSIS — K219 Gastro-esophageal reflux disease without esophagitis: Secondary | ICD-10-CM

## 2023-05-31 DIAGNOSIS — K2100 Gastro-esophageal reflux disease with esophagitis, without bleeding: Secondary | ICD-10-CM

## 2023-05-31 DIAGNOSIS — K2950 Unspecified chronic gastritis without bleeding: Secondary | ICD-10-CM

## 2023-05-31 DIAGNOSIS — R1310 Dysphagia, unspecified: Secondary | ICD-10-CM | POA: Diagnosis not present

## 2023-05-31 DIAGNOSIS — K2980 Duodenitis without bleeding: Secondary | ICD-10-CM | POA: Diagnosis not present

## 2023-05-31 DIAGNOSIS — K3189 Other diseases of stomach and duodenum: Secondary | ICD-10-CM

## 2023-05-31 DIAGNOSIS — K449 Diaphragmatic hernia without obstruction or gangrene: Secondary | ICD-10-CM | POA: Diagnosis not present

## 2023-05-31 DIAGNOSIS — K222 Esophageal obstruction: Secondary | ICD-10-CM | POA: Diagnosis not present

## 2023-05-31 MED ORDER — SODIUM CHLORIDE 0.9 % IV SOLN: 500.0000 mL | Freq: Once | INTRAVENOUS | Status: DC

## 2023-05-31 NOTE — Progress Notes (Signed)
 Report to PACU, RN, vss, BBS= Clear.

## 2023-05-31 NOTE — Op Note (Signed)
 [facility] Patient Name: Bobby Mcbride Procedure Date: 05/31/2023 2:02 PM MRN: [id number] Endoscopist: Wilhemina Bonito. Marina Goodell , MD, [id number] Age: 53 Referring MD:  Date of Birth: 07/03/1970 Gender: Male Account #: [id number] Procedure:                Upper GI endoscopy with biopsies; balloon dilation                            of the esophagus?"20 mm max Indications:              Dysphagia, Therapeutic procedure, Esophageal                            reflux. History of food impaction Medicines:                Monitored Anesthesia Care Procedure:                Pre-Anesthesia Assessment:                           - Prior to the procedure, a History and Physical                            was performed, and patient medications and                            allergies were reviewed. The patient's tolerance of                            previous anesthesia was also reviewed. The risks                            and benefits of the procedure and the sedation                            options and risks were discussed with the patient.                            All questions were answered, and informed consent                            was obtained. Prior Anticoagulants: The patient has                            taken no anticoagulant or antiplatelet agents. ASA                            Grade Assessment: II - A patient with mild systemic                            disease. After reviewing the risks and benefits,                            the patient was deemed in satisfactory condition to  undergo the procedure.                           After obtaining informed consent, the endoscope was                            passed under direct vision. Throughout the                            procedure, the patient's blood pressure, pulse, and                            oxygen saturations were monitored continuously. The                            GIF [id number]  #[id number] was introduced through the                            mouth, and advanced to the second part of duodenum.                            The upper GI endoscopy was accomplished without                            difficulty. The patient tolerated the procedure                            well. Scope In: Scope Out: Findings:                 One benign-appearing, ringlike intrinsic moderate                            stenosis was found 35 cm from the incisors. This                            stenosis measured 1.5 cm (inner diameter). There                            was associated mild esophagitis. After completing                            the endoscopic survey, A TTS dilator was passed                            through the scope. Dilation with an 18-19-20 mm                            balloon dilator was performed to 20 mm.                           The exam of the esophagus was otherwise normal. No                            Barrett's.  The stomach revealed a hiatal hernia but was                            otherwise normal. Biopsies were taken with a cold                            forceps for histology to rule out Helicobacter                            pylori.                           The examined duodenum revealed nonerosive                            duodenitis involving the bulb and proximal second                            portion.                           The cardia and gastric fundus were normal on                            retroflexion. Complications:            No immediate complications. Estimated Blood Loss:     Estimated blood loss: none. Impression:               1. GERD complicated by esophagitis and peptic                            stricture status post dilation                           2. Duodenitis. Status post antral biopsies. Recommendation:           1. Patient has a contact number available for                             emergencies. The signs and symptoms of potential                            delayed complications were discussed with the                            patient. Return to normal activities tomorrow.                            Written discharge instructions were provided to the                            patient.                           2. Post dilation diet.  3. Continue present medications.                           4. PLEASE TAKE PANTOPRAZOLE 40 mg TWICE DAILY,                            EVERY DAY                           5. Office follow-up with Dr. Marina Goodell in about 6 weeks                           - Await pathology results. Wilhemina Bonito. Marina Goodell, MD 05/31/2023 2:29:17 PM This report has been signed electronically.

## 2023-05-31 NOTE — Patient Instructions (Addendum)
-   Post dilation diet. - Continue present medications. - PLEASE TAKE PANTOPRAZOLE 40 mg TWICE DAILY, EVERY DAY - Office follow-up with Dr. Marina Goodell in about 6 weeks - Await pathology results.  YOU HAD AN ENDOSCOPIC PROCEDURE TODAY AT THE [facility]:   Refer to the procedure report that was given to you for any specific questions about what was found during the examination.  If the procedure report does not answer your questions, please call your gastroenterologist to clarify.  If you requested that your care partner not be given the details of your procedure findings, then the procedure report has been included in a sealed envelope for you to review at your convenience later.  YOU SHOULD EXPECT: Some feelings of bloating in the abdomen. Passage of more gas than usual.  Walking can help get rid of the air that was put into your GI tract during the procedure and reduce the bloating. If you had a lower endoscopy (such as a colonoscopy or flexible sigmoidoscopy) you may notice spotting of blood in your stool or on the toilet paper. If you underwent a bowel prep for your procedure, you may not have a normal bowel movement for a few days.  Please Note:  You might notice some irritation and congestion in your nose or some drainage.  This is from the oxygen used during your procedure.  There is no need for concern and it should clear up in a day or so.  SYMPTOMS TO REPORT IMMEDIATELY:  Following upper endoscopy (EGD)  Vomiting of blood or coffee ground material  New chest pain or pain under the shoulder blades  Painful or persistently difficult swallowing  New shortness of breath  Fever of 100F or higher  Black, tarry-looking stools  For urgent or emergent issues, a gastroenterologist can be reached at any hour by calling [phone]. Do not use MyChart messaging for urgent concerns.    DIET:  We do recommend a small meal at first, but then you may proceed to your regular diet.  Drink  plenty of fluids but you should avoid alcoholic beverages for 24 hours.  ACTIVITY:  You should plan to take it easy for the rest of today and you should NOT DRIVE or use heavy machinery until tomorrow (because of the sedation medicines used during the test).    FOLLOW UP: Our staff will call the number listed on your records the next business day following your procedure.  We will call around 7:15- 8:00 am to check on you and address any questions or concerns that you may have regarding the information given to you following your procedure. If we do not reach you, we will leave a message.     If any biopsies were taken you will be contacted by phone or by letter within the next 1-3 weeks.  Please call us at [phone] if you have not heard about the biopsies in 3 weeks.    SIGNATURES/CONFIDENTIALITY: You and/or your care partner have signed paperwork which will be entered into your electronic medical record.  These signatures attest to the fact that that the information above on your After Visit Summary has been reviewed and is understood.  Full responsibility of the confidentiality of this discharge information lies with you and/or your care-partner.

## 2023-05-31 NOTE — Progress Notes (Signed)
 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  He presents today with his wife regarding ongoing problems with dysphagia.  Prior patient of Dr. Rob Bunting.   The patient has a history of GERD complicated by erosive esophagitis and peptic stricture with recurrent transient food impactions which have brought him to the emergency room.  His last such episode was February 01, 2023 when he underwent upper endoscopy with Dr. Leone Payor.  In addition to esophagitis and peptic stricture esophageal biopsies were taken.  The stricture was balloon dilated to 20 mm.  Biopsies were most consistent with reflux esophagitis.   The patient tells me that he has had ongoing problems with intermittent solid food dysphagia.  He describes himself as frustrated and depressed.  He wonders if he is going to have this problem "the rest of his life".  He was prescribed pantoprazole 40 mg twice daily.  However, he tells me that he is taking this once daily approximately 2-3 times per week.  Does have intermittent reflux symptoms.  Also, he is partially edentulous and cannot chew his food particularly well.   Patient has undergone prior colonoscopy in June 2023.  Examination was normal.  Follow-up in 10 years recommended.   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 07/2022 Echo: EF 60-65%, no rwma, nl RV size/fxn.  No significant valvular disease.   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 per pt dx age 52; b. 07/2022 Echo: No MVP/MR.   Perianal lesion     Pre-syncope      a. 07/2022 Zio x 2 (total 4 wks): 1.  Predominantly sinus rhythm with average heart rate of 64 (38-144).  Rare PACs and PVCs. 2.  Predominantly sinus rhythm at average of 58 (37-99).  Rare PACs and PVCs.               Past Surgical History:  Procedure Laterality Date   APPENDECTOMY   1990   BALLOON DILATION N/A 02/01/2023    Procedure: BALLOON DILATION;  Surgeon: Iva Boop, MD;  Location: [facility] ENDOSCOPY;  Service: Gastroenterology;  Laterality: N/A;   BIOPSY   09/03/2018    Procedure: BIOPSY;  Surgeon: Lynann Bologna, MD;  Location: [facility] ENDOSCOPY;  Service: Endoscopy;;   BIOPSY   02/01/2023    Procedure: BIOPSY;  Surgeon: Iva Boop, MD;  Location: [facility] ENDOSCOPY;  Service: Gastroenterology;;   COLONOSCOPY WITH ESOPHAGOGASTRODUODENOSCOPY (EGD)   08/31/2021    by dr Christella Hartigan  ELBOW SURGERY Right 1987   ESOPHAGOGASTRODUODENOSCOPY   08/18/2011    Procedure: ESOPHAGOGASTRODUODENOSCOPY (EGD);  Surgeon: Beverley Fiedler, MD;  Location: [facility] ENDOSCOPY;  Service: Gastroenterology;  Laterality: N/A;   ESOPHAGOGASTRODUODENOSCOPY   11/11/2011    Procedure: ESOPHAGOGASTRODUODENOSCOPY (EGD);  Surgeon: Iva Boop, MD;  Location: [facility] ENDOSCOPY;  Service: Endoscopy;  Laterality: N/A;   ESOPHAGOGASTRODUODENOSCOPY (EGD) WITH PROPOFOL N/A 09/03/2018    Procedure: ESOPHAGOGASTRODUODENOSCOPY (EGD) WITH PROPOFOL;  Surgeon: Lynann Bologna, MD;  Location: [facility] ENDOSCOPY;  Service: Endoscopy;  Laterality: N/A;   ESOPHAGOGASTRODUODENOSCOPY (EGD) WITH PROPOFOL N/A 11/24/2021    Procedure: ESOPHAGOGASTRODUODENOSCOPY (EGD) WITH PROPOFOL;  Surgeon: Hilarie Fredrickson, MD;  Location: [facility]  ENDOSCOPY;  Service: Gastroenterology;  Laterality: N/A;   ESOPHAGOGASTRODUODENOSCOPY (EGD) WITH PROPOFOL N/A 02/01/2023    Procedure: ESOPHAGOGASTRODUODENOSCOPY (EGD) WITH PROPOFOL;  Surgeon: Iva Boop, MD;  [facility]COPY;  Service: Gastroenterology;  Laterality: N/A;   KNEE ARTHROSCOPY Right 02/26/2009    @[facility]  by dr c. blackman   MASS EXCISION N/A 10/15/2021    Procedure: EXCISION OF PERIANAL MASS;  Surgeon: Andria Meuse, MD;  Location: [facility];  Service: General;  Laterality: N/A;   MULTIPLE TOOTH EXTRACTIONS        several ---  w/ IV sedation  last extraction 05/ 2023   RECTAL EXAM UNDER ANESTHESIA N/A 10/15/2021    Procedure: ANORECTAL EXAM UNDER ANESTHESIA;  Surgeon: Andria Meuse, MD;  Location: [facility];  Service: General;  Laterality: N/A;   SHOULDER ARTHROSCOPY W/ ROTATOR CUFF REPAIR Right 2012   SKIN BIOPSY N/A 10/15/2021    Procedure: BIOPSY OF GLUTEAL SKIN;  Surgeon: Andria Meuse, MD;  Location: [facility];  Service: General;  Laterality: N/A;          Social History Bobby Mcbride  reports that he has never smoked. He has never used smokeless tobacco. He reports that he does not drink alcohol and does not use drugs.   family history includes Asthma in his father; Colon polyps in his father; Diabetes in his mother; Heart attack in his father; Heart failure in his father; Hypertension in his father and mother; Migraines in his mother; Prostate cancer in his father; Stroke in his mother.   Allergies       Allergies  Allergen Reactions   Codeine Other (See Comments)      Hallucinations   Hydrocodone-Acetaminophen Other (See Comments)      Hallucinations   Food        Pepperoni-nausea/vomiting Lettuce-nausea/vomiting Oranges-nausea/vomiting Cheese-nausea/vomiting   Ibuprofen        "ulcers in my stomach"   Compazine [Prochlorperazine Edisylate] Anxiety            PHYSICAL  EXAMINATION: VariancesVital signs: BP 118/80   Pulse 84   Ht 5\' 7"  (1.702 m)   Wt 169 lb (76.7 kg)   BMI 26.47 kg/m   Constitutional: generally well-appearing, no acute distress Psychiatric: alert and oriented x 3, cooperative Eyes: extraocular movements intact, anicteric, conjunctiva pink Mouth: oral pharynx moist, no lesions, multiple teeth missing Neck: supple no lymphadenopathy Cardiovascular: heart regular rate and rhythm, no murmur Lungs: clear to auscultation bilaterally Abdomen: soft, nontender, nondistended, no obvious ascites, no peritoneal signs, normal bowel sounds, no organomegaly Rectal: Omitted Extremities: no clubbing, cyanosis, or lower extremity edema bilaterally Skin: no lesions on visible extremities Neuro: No focal deficits.  Cranial nerves intact   ASSESSMENT:   1.  GERD complicated by erosive esophagitis  and peptic stricture. 2.  Intermittent solid food dysphagia.  Ongoing 3.  Medical noncompliance 4.  Normal colonoscopy 2023     PLAN:   1.  Reflux precautions 2.  Prescribe pantoprazole 40 mg p.o. twice daily.  Strictly advised to take the medicine as prescribed.  Medication risks reviewed 3.  Schedule for endoscopy with repeat esophageal dilation.The nature of the procedure, as well as the risks, benefits, and alternatives were carefully and thoroughly reviewed with the patient. Ample time for discussion and questions allowed. The patient understood, was satisfied, and agreed to proceed. 4.  Consider dentures 5.  Screening colonoscopy around 2033 6.  Office follow-up after the above to monitor response to therapies.

## 2023-05-31 NOTE — Progress Notes (Signed)
 Called to room to assist during endoscopic procedure.  Patient ID and intended procedure confirmed with present staff. Received instructions for my participation in the procedure from the performing physician.

## 2023-06-01 ENCOUNTER — Telehealth: Payer: Self-pay | Admitting: *Deleted

## 2023-06-01 NOTE — Telephone Encounter (Signed)
  Follow up Call-     05/31/2023    1:14 PM 05/20/2022   10:00 AM 09/01/2021    2:44 PM 05/11/2021    9:03 AM  Call back number  Post procedure Call Back phone  # [phone]  Permission to leave phone message Yes Yes Yes Yes     Patient questions:  Do you have a fever, pain , or abdominal swelling? No. Pain Score  0 *  Have you tolerated food without any problems? Yes.    Have you been able to return to your normal activities? Yes.    Do you have any questions about your discharge instructions: Diet   No. Medications  No. Follow up visit  No.  Do you have questions or concerns about your Care? Yes.    Actions: * If pain score is 4 or above: No action needed, pain <4.

## 2023-06-05 ENCOUNTER — Encounter: Payer: Self-pay | Admitting: Internal Medicine

## 2023-06-05 LAB — SURGICAL PATHOLOGY

## 2023-06-11 ENCOUNTER — Emergency Department (HOSPITAL_BASED_OUTPATIENT_CLINIC_OR_DEPARTMENT_OTHER)
Admission: EM | Admit: 2023-06-11 | Discharge: 2023-06-12 | Disposition: A | Discharge status: Home / Self Care | Attending: Emergency Medicine | Admitting: Emergency Medicine

## 2023-06-11 ENCOUNTER — Encounter (HOSPITAL_BASED_OUTPATIENT_CLINIC_OR_DEPARTMENT_OTHER): Payer: Self-pay

## 2023-06-11 ENCOUNTER — Emergency Department (HOSPITAL_BASED_OUTPATIENT_CLINIC_OR_DEPARTMENT_OTHER)

## 2023-06-11 ENCOUNTER — Other Ambulatory Visit: Payer: Self-pay

## 2023-06-11 DIAGNOSIS — Z7982 Long term (current) use of aspirin: Secondary | ICD-10-CM | POA: Insufficient documentation

## 2023-06-11 DIAGNOSIS — G43909 Migraine, unspecified, not intractable, without status migrainosus: Secondary | ICD-10-CM | POA: Diagnosis not present

## 2023-06-11 DIAGNOSIS — R519 Headache, unspecified: Secondary | ICD-10-CM | POA: Diagnosis not present

## 2023-06-11 LAB — CBC WITH DIFFERENTIAL/PLATELET
Abs Immature Granulocytes: 0.02 10*3/uL (ref 0.00–0.07)
Basophils Absolute: 0 10*3/uL (ref 0.0–0.1)
Basophils Relative: 0 %
Eosinophils Absolute: 0.9 10*3/uL — ABNORMAL HIGH (ref 0.0–0.5)
Eosinophils Relative: 9 %
HCT: 46.7 % (ref 39.0–52.0)
Hemoglobin: 16.6 g/dL (ref 13.0–17.0)
Immature Granulocytes: 0 %
Lymphocytes Relative: 24 %
Lymphs Abs: 2.4 10*3/uL (ref 0.7–4.0)
MCH: 30 pg (ref 26.0–34.0)
MCHC: 35.5 g/dL (ref 30.0–36.0)
MCV: 84.3 fL (ref 80.0–100.0)
Monocytes Absolute: 1 10*3/uL (ref 0.1–1.0)
Monocytes Relative: 10 %
Neutro Abs: 5.7 10*3/uL (ref 1.7–7.7)
Neutrophils Relative %: 57 %
Platelets: 237 10*3/uL (ref 150–400)
RBC: 5.54 MIL/uL (ref 4.22–5.81)
RDW: 12.9 % (ref 11.5–15.5)
WBC: 9.9 10*3/uL (ref 4.0–10.5)
nRBC: 0 % (ref 0.0–0.2)

## 2023-06-11 LAB — COMPREHENSIVE METABOLIC PANEL WITH GFR
ALT: 28 U/L (ref 0–44)
AST: 20 U/L (ref 15–41)
Albumin: 4.4 g/dL (ref 3.5–5.0)
Alkaline Phosphatase: 71 U/L (ref 38–126)
Anion gap: 7 (ref 5–15)
BUN: 12 mg/dL (ref 6–20)
CO2: 30 mmol/L (ref 22–32)
Calcium: 9.1 mg/dL (ref 8.9–10.3)
Chloride: 100 mmol/L (ref 98–111)
Creatinine, Ser: 1.12 mg/dL (ref 0.61–1.24)
GFR, Estimated: >60 mL/min (ref >60)
Glucose, Bld: 90 mg/dL (ref 70–99)
Potassium: 4.2 mmol/L (ref 3.5–5.1)
Sodium: 137 mmol/L (ref 135–145)
Total Bilirubin: 0.5 mg/dL (ref 0.0–1.2)
Total Protein: 7.3 g/dL (ref 6.5–8.1)

## 2023-06-11 MED ORDER — DIPHENHYDRAMINE HCL 50 MG/ML IJ SOLN
12.5000 mg | Freq: Once | INTRAMUSCULAR | Status: AC
  Administered 2023-06-11: 12.5 mg via INTRAVENOUS
  Filled 2023-06-11: qty 1

## 2023-06-11 MED ORDER — SODIUM CHLORIDE 0.9 % IV BOLUS
1000.0000 mL | Freq: Once | INTRAVENOUS | Status: AC
  Administered 2023-06-11: 1000 mL via INTRAVENOUS

## 2023-06-11 MED ORDER — METOCLOPRAMIDE HCL 5 MG/ML IJ SOLN
10.0000 mg | Freq: Once | INTRAMUSCULAR | Status: AC
  Administered 2023-06-11: 10 mg via INTRAVENOUS
  Filled 2023-06-11: qty 2

## 2023-06-11 MED ORDER — DEXAMETHASONE SODIUM PHOSPHATE 10 MG/ML IJ SOLN
10.0000 mg | Freq: Once | INTRAMUSCULAR | Status: AC
  Administered 2023-06-12: 10 mg via INTRAVENOUS
  Filled 2023-06-11: qty 1

## 2023-06-11 NOTE — Discharge Instructions (Addendum)
 You were seen today for migraine headache.  You were given Decadron, Benadryl, Reglan and a liter of fluids which seem to have improved symptoms.  Recommend you Follow-up with the PCP for further migraine treatments.  As your labs and imaging are both very reassuring that I have low suspicion for any emergent pathology present at this time.  Decadron will also help prevent further headaches over the course of the next day or 2.  Return for any new or worsening symptoms

## 2023-06-11 NOTE — ED Triage Notes (Signed)
 Pt c/o "top side & back of head HA," nausea, dizziness, onset Tuesday. Tylenol, ibuprofen at home "but nothing helps." States that Tuesday, "I got real dizzy in the shower & had to sit down, then the HA started." Denies hitting head, blood thinners.   PERRLA, denies unilateral weakness, neuro/ stroke symptoms. Advises familial hx TIA

## 2023-06-11 NOTE — ED Provider Notes (Signed)
 [facility] EMERGENCY DEPARTMENT AT [facility] Provider Note   CSN: [id number] Arrival date & time: 06/11/23  1934     History  Chief Complaint  Patient presents with   Headache    Bobby Mcbride is a 53 y.o. male.   Headache Patient is a 53 year old male presents the ED today with a 5-day history of pulsatile headache to the left side of his head that has been persistent.  Previous medical history of migraines, hiatal hernia, esophagitis, esophageal strictures, GERD.  Notes to have photophobia with no aura noted prior to headache.  States that the headache is worse with activity and movement.  Has also noted that he had become dizzy at times reporting blurry vision and instability specifically when he got out of the hot shower.  States that he is not able to take Compazine due to having a "burning sensation in his stomach."  Also noted to not be able to take Toradol or any NSAID due to ulcers in the stomach.  Denies fever, numbness, weakness, tingling, dysuria, vomiting.      Home Medications Prior to Admission medications   Medication Sig Start Date End Date Taking? Authorizing Provider  aspirin EC 81 MG tablet Take 1 tablet (81 mg total) by mouth daily. Swallow whole. 08/06/22   Kathlen Mody, MD  hydrocortisone (ANUSOL-HC) 2.5 % rectal cream Apply 1 Application topically daily as needed for hemorrhoids. 11/01/21   [provider]  loperamide (IMODIUM) 2 MG capsule Take 1 capsule (2 mg total) by mouth 4 (four) times daily as needed for diarrhea or loose stools. 04/30/23   Lonell Grandchild, MD  methocarbamol (ROBAXIN) 750 MG tablet Take 1 tablet (750 mg total) by mouth 2 (two) times daily. 12/25/22   Elpidio Anis, PA-C  ondansetron (ZOFRAN-ODT) 4 MG disintegrating tablet Take 1 tablet (4 mg total) by mouth every 8 (eight) hours as needed for nausea or vomiting. 04/30/23   Lonell Grandchild, MD  oxyCODONE-acetaminophen (PERCOCET/ROXICET) 5-325 MG tablet Take 1  tablet by mouth every 6 (six) hours as needed for severe pain. 12/25/22   Elpidio Anis, PA-C  pantoprazole (PROTONIX) 40 MG tablet Take 1 tablet (40 mg total) by mouth 2 (two) times daily before a meal. 02/16/23   Hilarie Fredrickson, MD  pantoprazole (PROTONIX) 40 MG tablet Take 1 tablet (40 mg total) by mouth 2 (two) times daily. 05/04/23   Hilarie Fredrickson, MD  predniSONE (DELTASONE) 10 MG tablet Take 6 on days one and two (12/26/22) Take 5 on days three and four Take 4 on days five and six Take 3 on days seven and eight Take 2 on days nine and ten Take 1 on days eleven and twelve Patient not taking: Reported on 05/04/2023 12/25/22   Elpidio Anis, PA-C  pregabalin (LYRICA) 75 MG capsule Take 1 capsule (75 mg total) by mouth 2 (two) times daily. 12/21/22 01/20/23  London Sheer, MD      Allergies    Codeine, Food, Hydrocodone-acetaminophen, Ibuprofen, and Compazine [prochlorperazine edisylate]    Review of Systems   Review of Systems  Neurological:  Positive for headaches.  All other systems reviewed and are negative.   Physical Exam Updated Vital Signs BP 131/80   Pulse 65   Temp 97.9 F (36.6 C) (Oral)   Resp 18   SpO2 97%  Physical Exam Vitals and nursing note reviewed.  Constitutional:      General: He is not in acute distress.    Appearance:  Normal appearance. He is not ill-appearing.  HENT:     Head: Normocephalic and atraumatic.  Eyes:     General: No scleral icterus.    Extraocular Movements: Extraocular movements intact.     Right eye: Normal extraocular motion and no nystagmus.     Left eye: Normal extraocular motion and no nystagmus.     Conjunctiva/sclera: Conjunctivae normal.     Pupils: Pupils are equal, round, and reactive to light. Pupils are equal.     Right eye: Pupil is round and reactive.     Left eye: Pupil is round and reactive.  Cardiovascular:     Rate and Rhythm: Normal rate and regular rhythm.     Pulses: Normal pulses.     Heart sounds: Normal heart  sounds. No murmur heard.    No friction rub. No gallop.  Pulmonary:     Effort: Pulmonary effort is normal. No respiratory distress.     Breath sounds: Normal breath sounds. No stridor. No wheezing, rhonchi or rales.  Abdominal:     General: Abdomen is flat.     Palpations: Abdomen is soft.     Tenderness: There is no abdominal tenderness.  Skin:    General: Skin is warm and dry.  Neurological:     General: No focal deficit present.     Mental Status: He is alert and oriented to person, place, and time. Mental status is at baseline.     Sensory: No sensory deficit.     Motor: No weakness.     Coordination: Romberg sign negative.     Gait: Gait normal.     Deep Tendon Reflexes: Reflexes normal. Babinski sign absent on the right side. Babinski sign absent on the left side.  Psychiatric:        Mood and Affect: Mood normal. Mood is not anxious or depressed.        Behavior: Behavior is not agitated.        Cognition and Memory: Cognition is not impaired.     ED Results / Procedures / Treatments   Labs (all labs ordered are listed, but only abnormal results are displayed) Labs Reviewed  CBC WITH DIFFERENTIAL/PLATELET - Abnormal; Notable for the following components:      Result Value   Eosinophils Absolute 0.9 (*)    All other components within normal limits  COMPREHENSIVE METABOLIC PANEL WITH GFR    EKG None  Radiology CT Head Wo Contrast Result Date: 06/11/2023 CLINICAL DATA:  Headache EXAM: CT HEAD WITHOUT CONTRAST TECHNIQUE: Contiguous axial images were obtained from the base of the skull through the vertex without intravenous contrast. RADIATION DOSE REDUCTION: This exam was performed according to the departmental dose-optimization program which includes automated exposure control, adjustment of the mA and/or kV according to patient size and/or use of iterative reconstruction technique. COMPARISON:  CT brain 08/04/2022 FINDINGS: Brain: No evidence of acute infarction,  hemorrhage, hydrocephalus, extra-axial collection or mass lesion/mass effect. Vascular: No hyperdense vessel or unexpected calcification. Skull: Normal. Negative for fracture or focal lesion. Sinuses/Orbits: Extensive mucosal opacification of the maxillary and sphenoid sinuses with moderate mucosal thickening in the ethmoid sinuses Other: None IMPRESSION: 1. Negative non contrasted CT appearance of the brain. 2. Extensive paranasal sinus disease. Electronically Signed   By: Jasmine Pang M.D.   On: 06/11/2023 19:54    Procedures Procedures    Medications Ordered in ED Medications  dexamethasone (DECADRON) injection 10 mg (has no administration in time range)  sodium chloride 0.9 %  bolus 1,000 mL (0 mLs Intravenous Stopped 06/11/23 2355)  metoCLOPramide (REGLAN) injection 10 mg (10 mg Intravenous Given 06/11/23 2254)  diphenhydrAMINE (BENADRYL) injection 12.5 mg (12.5 mg Intravenous Given 06/11/23 2251)    ED Course/ Medical Decision Making/ A&P Clinical Course as of 06/11/23 2359  Sun Jun 11, 2023  2235 CT Head Wo Contrast [CB]    Clinical Course User Index [CB] Lunette Stands, PA-C                                 Medical Decision Making Amount and/or Complexity of Data Reviewed Labs: ordered. Radiology: ordered.   This patient is a 53 year old male who presents to the ED for concern of left-sided headache.  Suspect migraine as patient symptoms seems most prevalent with migraine versus tension headache.  However will obtain labs due to length of time as well as imaging due to patient having increased in pain.  On physical exam, patient has a normal neuroexam, afebrile, no acute distress.  However is noted to have pain with pupil evaluation with light.  Exam is otherwise unremarkable.  Provided fluids as well as Reglan and Benadryl due to patient's restrictions of not wanting to take Compazine or Toradol.  On reevaluation, patient notes that the headache has improved but still  present.  Still does not want to use Compazine.  Provided Decadron to help with recurrence for the next day or 2 before he can follow-up with PCP.  On reevaluation, patient states that his headache has since greatly improved and is ready to go home.  Will ensure that he follows up with PCP for further evaluation and provide strict return to ED precautions if you begin to have any red flag symptoms.  Patient quest agreement understanding of plan.  I believe patient safe discharge at this time.  All questions answered.  Differential diagnoses prior to evaluation: The emergent differential diagnosis includes, but is not limited to, migraine headache, tension headache, cluster headache, CVA, tumor, intracranial bleed, glaucoma, orthostatic hypertension, vault disturbance. This is not an exhaustive differential.   Past Medical History / Co-morbidities / Social History: Migraines, esophagitis, esophageal stricture, MVP, GERD, arthritis,  Additional history: Chart reviewed. Pertinent results include: Seen in the ED on 04/30/2023 for gastritis Seen by GI on 05/31/2023 for dysphagia where he underwent upper endoscopy for esophagitis and peptic stricture esophageal biopsies noted to have reflux esophagitis.  Lab Tests/Imaging studies: I personally interpreted labs/imaging and the pertinent results include: CBC unremarkable BMP unremarkable CT shows extensive perinasal sinus disease but otherwise negative for any acute abnormalities  I agree with the radiologist interpretation.    Medications: I ordered medication including Benadryl, Reglan, normal saline.  I have reviewed the patients home medicines and have made adjustments as needed.  Disposition: After consideration of the diagnostic results and the patients response to treatment, I feel that the patient would benefit from discharge and treatment as above.   emergency department workup does not suggest an emergent condition requiring admission or  immediate intervention beyond what has been performed at this time. The plan is: Follow-up with PCP, return for new or worsening symptoms. The patient is safe for discharge and has been instructed to return immediately for worsening symptoms, change in symptoms or any other concerns.  Final Clinical Impression(s) / ED Diagnoses Final diagnoses:  Migraine without status migrainosus, not intractable, unspecified migraine type    Rx / DC Orders ED Discharge  Orders     None         Lunette Stands, [place] 06/11/23 2359    Rolan Bucco, MD 06/14/23 [phone]

## 2023-08-02 LAB — CBC AND DIFFERENTIAL
HCT: 53 (ref 41–53)
Hemoglobin: 17.5 (ref 13.5–17.5)
Platelets: 279 K/uL (ref 150–400)
WBC: 10.5

## 2023-08-02 LAB — LIPID PANEL
Cholesterol: 234 — AB (ref 0–200)
HDL: 45 (ref 35–70)
LDL Cholesterol: 166
LDl/HDL Ratio: 5.2
Triglycerides: 125 (ref 40–160)

## 2023-08-02 LAB — COMPREHENSIVE METABOLIC PANEL WITH GFR
Albumin: 4.8 (ref 3.5–5.0)
Calcium: 9.2 (ref 8.7–10.7)
Globulin: 2.6
eGFR: 72

## 2023-08-02 LAB — BASIC METABOLIC PANEL WITH GFR
BUN: 12 (ref 4–21)
Chloride: 98 — AB (ref 99–108)
Creatinine: 1.2 (ref 0.6–1.3)
Glucose: 124
Potassium: 4 meq/L (ref 3.5–5.1)
Sodium: 138 (ref 137–147)

## 2023-08-02 LAB — HEMOGLOBIN A1C: Hemoglobin A1C: 5.4

## 2023-08-02 LAB — HEPATIC FUNCTION PANEL
ALT: 45 U/L — AB (ref 10–40)
AST: 33 (ref 14–40)
Alkaline Phosphatase: 86 (ref 25–125)
Bilirubin, Total: 0.4

## 2023-08-02 LAB — TSH: TSH: 4.38 (ref 0.41–5.90)

## 2023-08-02 LAB — CBC: RBC: 5.96 — AB (ref 3.87–5.11)

## 2023-08-10 ENCOUNTER — Ambulatory Visit: Admitting: Internal Medicine

## 2023-09-01 NOTE — Progress Notes (Unsigned)
 Referring Physician:  Dorothe Gaster, NP [address] [place],  [place] [address]  Primary Physician:  Dorothe Gaster, NP  History of Present Illness: 09/01/2023 [name] is here today with a chief complaint of ***  Back pain Any leg pain?     Duration: *** Location: *** Quality: *** Severity: ***  Precipitating: aggravated by *** Modifying factors: made better by *** Weakness: none Timing: *** Bowel/Bladder Dysfunction: none  Conservative measures:  Physical therapy: has not participated in PT  Multimodal medical therapy including regular antiinflammatories: Lyrica , Prednisone , Oxycodone , Methocarbamol   Injections: 02/25/2020 S1 Lumbosacral TESI 06/13/2019 L5-S1 ESI  Past Surgery: none  Bobby Mcbride has ***no symptoms of cervical myelopathy.  The symptoms are causing a significant impact on the patient's life.   I have utilized the care everywhere function in epic to review the outside records available from external health systems.  Review of Systems:  A 10 point review of systems is negative, except for the pertinent positives and negatives detailed in the HPI.  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 07/2022 Echo: EF 60-65%, no rwma, nl RV size/fxn.  No significant valvular disease.   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 per pt dx age 6; b. 07/2022 Echo: No MVP/MR.   Perianal lesion     Pre-syncope    a. 07/2022 Zio x 2 (total 4 wks): 1.  Predominantly sinus rhythm with average heart rate of 64 (38-144).  Rare PACs and PVCs. 2.  Predominantly sinus rhythm at average of 58 (37-99).  Rare PACs and PVCs.    Past Surgical History: Past Surgical History:  Procedure Laterality Date   APPENDECTOMY  1990   BALLOON DILATION N/A 02/01/2023   Procedure: BALLOON DILATION;  Surgeon: Kenney Peacemaker, MD;  Location: [facility] ENDOSCOPY;  Service: Gastroenterology;  Laterality: N/A;   BIOPSY  09/03/2018   Procedure: BIOPSY;  Surgeon: Lajuan Pila, MD;  Location: [facility] ENDOSCOPY;  Service: Endoscopy;;   BIOPSY  02/01/2023   Procedure: BIOPSY;  Surgeon: Kenney Peacemaker, MD;  Location: [facility] ENDOSCOPY;  Service: Gastroenterology;;   COLONOSCOPY WITH ESOPHAGOGASTRODUODENOSCOPY (EGD)  08/31/2021   by dr Howard Macho   ELBOW SURGERY Right 1987   ESOPHAGOGASTRODUODENOSCOPY  08/18/2011   Procedure: ESOPHAGOGASTRODUODENOSCOPY (EGD);  Surgeon: Nannette Babe, MD;  Location: [facility] ENDOSCOPY;  Service: Gastroenterology;  Laterality: N/A;   ESOPHAGOGASTRODUODENOSCOPY  11/11/2011   Procedure: ESOPHAGOGASTRODUODENOSCOPY (EGD);  Surgeon: Kenney Peacemaker, MD;  Location: [facility] ENDOSCOPY;  Service: Endoscopy;  Laterality: N/A;   ESOPHAGOGASTRODUODENOSCOPY (EGD) WITH PROPOFOL  N/A 09/03/2018   Procedure: ESOPHAGOGASTRODUODENOSCOPY (EGD) WITH PROPOFOL ;  Surgeon: Lajuan Pila, MD;  Location: [facility] ENDOSCOPY;  Service: Endoscopy;  Laterality: N/A;   ESOPHAGOGASTRODUODENOSCOPY (EGD) WITH PROPOFOL  N/A 11/24/2021   Procedure: ESOPHAGOGASTRODUODENOSCOPY (EGD) WITH PROPOFOL ;  Surgeon: Tobin Forts, MD;  Location: [facility] ENDOSCOPY;  Service: Gastroenterology;  Laterality: N/A;   ESOPHAGOGASTRODUODENOSCOPY (EGD) WITH PROPOFOL  N/A 02/01/2023   Procedure: ESOPHAGOGASTRODUODENOSCOPY (EGD) WITH PROPOFOL ;  Surgeon: Kenney Peacemaker, MD;  Location: [facility] ENDOSCOPY;  Service: Gastroenterology;  Laterality: N/A;   KNEE ARTHROSCOPY Right 02/26/2009   @[facility]  by dr  c. blackman   MASS EXCISION N/A 10/15/2021   Procedure: EXCISION OF PERIANAL MASS;  Surgeon: Melvenia Stabs, MD;  Location: [facility] [place];  Service: General;  Laterality: N/A;   MULTIPLE TOOTH EXTRACTIONS     several ---  w/ IV sedation  last extraction 05/ 2023   RECTAL EXAM UNDER ANESTHESIA N/A 10/15/2021   Procedure: ANORECTAL EXAM UNDER ANESTHESIA;  Surgeon: Melvenia Stabs, MD;  Location: [facility];  Service: General;  Laterality: N/A;   SHOULDER ARTHROSCOPY W/ ROTATOR CUFF REPAIR Right 2012   SKIN BIOPSY N/A 10/15/2021   Procedure: BIOPSY OF GLUTEAL SKIN;  Surgeon: Melvenia Stabs, MD;  Location: [facility];  Service: General;  Laterality: N/A;    Allergies: Allergies as of 09/05/2023 - Review Complete 06/11/2023  Allergen Reaction Noted   Codeine Other (See Comments)    Food Nausea And Vomiting 10/18/2015   Hydrocodone -acetaminophen  Other (See Comments)    Ibuprofen Other (See Comments) 12/28/2016   Compazine  [prochlorperazine  edisylate] Anxiety 08/02/2011    Medications:  Current Outpatient Medications:    aspirin  EC 81 MG tablet, Take 1 tablet (81 mg total) by mouth daily. Swallow whole., Disp: 30 tablet, Rfl: 1   hydrocortisone  (ANUSOL -HC) 2.5 % rectal cream, Apply 1 Application topically daily as needed for hemorrhoids., Disp: , Rfl:    loperamide  (IMODIUM ) 2 MG capsule, Take 1 capsule (2 mg total) by mouth 4 (four) times daily as needed for diarrhea or loose stools., Disp: 12 capsule, Rfl: 0   methocarbamol  (ROBAXIN ) 750 MG tablet, Take 1 tablet (750 mg total) by mouth 2 (two) times daily., Disp: 20 tablet, Rfl: 0   ondansetron  (ZOFRAN -ODT) 4 MG disintegrating tablet, Take 1 tablet (4 mg total) by mouth every 8 (eight) hours as needed for nausea or vomiting., Disp: 16 tablet, Rfl: 0   oxyCODONE -acetaminophen  (PERCOCET/ROXICET) 5-325 MG tablet, Take 1 tablet by mouth every 6 (six) hours as needed for severe pain., Disp:  20 tablet, Rfl: 0   pantoprazole  (PROTONIX ) 40 MG tablet, Take 1 tablet (40 mg total) by mouth 2 (two) times daily before a meal., Disp: 60 tablet, Rfl: 6   pantoprazole  (PROTONIX ) 40 MG tablet, Take 1 tablet (40 mg total) by mouth 2 (two) times daily., Disp: 180 tablet, Rfl: 3   predniSONE  (DELTASONE ) 10 MG tablet, Take 6 on days one and two (12/26/22) Take 5 on days three and four Take 4 on days five and six Take 3 on days seven and eight Take 2 on days nine and ten Take 1 on days eleven and twelve (Patient not taking: Reported on 05/04/2023), Disp: 42 tablet, Rfl: 0   pregabalin  (LYRICA ) 75 MG capsule, Take 1 capsule (75 mg total) by mouth 2 (two) times daily., Disp: 60 capsule, Rfl: 0  Social History: Social History   Tobacco Use   Smoking status: Never   Smokeless tobacco: Never  Vaping Use   Vaping status: Never Used  Substance Use Topics   Alcohol use: No   Drug use: Never    Family Medical History: Family History  Problem Relation Age of Onset   Liver disease Mother    Hypertension Mother  Stroke Mother    Migraines Mother    Diabetes Mother    Prostate cancer Father    Hypertension Father    Heart failure Father    Asthma Father    Colon polyps Father    Heart attack Father        died in his 44's   Colon cancer Neg Hx    Esophageal cancer Neg Hx    Stomach cancer Neg Hx    Rectal cancer Neg Hx     Physical Examination: There were no vitals filed for this visit.  General: Patient is in no apparent distress. Attention to examination is appropriate.  Neck:   Supple.  Full range of motion.  Respiratory: Patient is breathing without any difficulty.   NEUROLOGICAL:     Awake, alert, oriented to person, place, and time.  Speech is clear and fluent.   Cranial Nerves: Pupils equal round and reactive to light.  Facial tone is symmetric.  Facial sensation is symmetric. Shoulder shrug is symmetric. Tongue protrusion is midline.  There is no pronator  drift.  Strength: Side Biceps Triceps Deltoid Interossei Grip Wrist Ext. Wrist Flex.  R 5 5 5 5 5 5 5   L 5 5 5 5 5 5 5    Side Iliopsoas Quads Hamstring PF DF EHL  R 5 5 5 5 5 5   L 5 5 5 5 5 5    Reflexes are ***2+ and symmetric at the biceps, triceps, brachioradialis, patella and achilles.   Hoffman's is absent.   Bilateral upper and lower extremity sensation is intact to light touch.    No evidence of dysmetria noted.  Gait is normal.     Medical Decision Making  Imaging: ***  I have personally reviewed the images and agree with the above interpretation.  Assessment and Plan: Mr. Fergusson is a pleasant 53 y.o. male with ***      Thank you for involving me in the care of this patient.       K. Mont Antis MD, [facility]

## 2023-09-05 ENCOUNTER — Ambulatory Visit (INDEPENDENT_AMBULATORY_CARE_PROVIDER_SITE_OTHER): Admitting: Neurosurgery

## 2023-09-05 ENCOUNTER — Encounter: Payer: Self-pay | Admitting: Neurosurgery

## 2023-09-05 VITALS — BP 142/90 | Ht 67.0 in | Wt 170.0 lb

## 2023-09-05 DIAGNOSIS — M533 Sacrococcygeal disorders, not elsewhere classified: Secondary | ICD-10-CM | POA: Diagnosis not present

## 2023-09-05 DIAGNOSIS — M545 Low back pain, unspecified: Secondary | ICD-10-CM | POA: Diagnosis not present

## 2023-09-05 DIAGNOSIS — G8929 Other chronic pain: Secondary | ICD-10-CM

## 2023-09-14 ENCOUNTER — Other Ambulatory Visit: Payer: Self-pay | Admitting: Student in an Organized Health Care Education/Training Program

## 2023-09-14 ENCOUNTER — Ambulatory Visit (HOSPITAL_BASED_OUTPATIENT_CLINIC_OR_DEPARTMENT_OTHER): Admitting: Student in an Organized Health Care Education/Training Program

## 2023-09-14 ENCOUNTER — Encounter: Payer: Self-pay | Admitting: Student in an Organized Health Care Education/Training Program

## 2023-09-14 ENCOUNTER — Ambulatory Visit
Admission: RE | Admit: 2023-09-14 | Discharge: 2023-09-14 | Disposition: A | Discharge status: Home / Self Care | Source: Ambulatory Visit | Attending: Student in an Organized Health Care Education/Training Program | Admitting: Student in an Organized Health Care Education/Training Program

## 2023-09-14 VITALS — BP 123/77 | HR 63 | Temp 97.7°F | Resp 16 | Ht 67.0 in | Wt 175.0 lb

## 2023-09-14 DIAGNOSIS — M47816 Spondylosis without myelopathy or radiculopathy, lumbar region: Secondary | ICD-10-CM | POA: Insufficient documentation

## 2023-09-14 DIAGNOSIS — G894 Chronic pain syndrome: Secondary | ICD-10-CM | POA: Diagnosis not present

## 2023-09-14 DIAGNOSIS — M5136 Other intervertebral disc degeneration, lumbar region with discogenic back pain only: Secondary | ICD-10-CM | POA: Insufficient documentation

## 2023-09-14 MED ORDER — CELECOXIB 100 MG PO CAPS: 100.0000 mg | ORAL_CAPSULE | Freq: Two times a day (BID) | ORAL | 2 refills | Status: DC | PRN

## 2023-09-14 NOTE — Patient Instructions (Signed)
GENERAL RISKS AND COMPLICATIONS  What are the risk, side effects and possible complications? Generally speaking, most procedures are safe.  However, with any procedure there are risks, side effects, and the possibility of complications.  The risks and complications are dependent upon the sites that are lesioned, or the type of nerve block to be performed.  The closer the procedure is to the spine, the more serious the risks are.  Great care is taken when placing the radio frequency needles, block needles or lesioning probes, but sometimes complications can occur. Infection: Any time there is an injection through the skin, there is a risk of infection.  This is why sterile conditions are used for these blocks.  There are four possible types of infection. Localized skin infection. Central Nervous System Infection-This can be in the form of Meningitis, which can be deadly. Epidural Infections-This can be in the form of an epidural abscess, which can cause pressure inside of the spine, causing compression of the spinal cord with subsequent paralysis. This would require an emergency surgery to decompress, and there are no guarantees that the patient would recover from the paralysis. Discitis-This is an infection of the intervertebral discs.  It occurs in about 1% of discography procedures.  It is difficult to treat and it may lead to surgery.        2. Pain: the needles have to go through skin and soft tissues, will cause soreness.       3. Damage to internal structures:  The nerves to be lesioned may be near blood vessels or    other nerves which can be potentially damaged.       4. Bleeding: Bleeding is more common if the patient is taking blood thinners such as  aspirin, Coumadin, Ticiid, Plavix, etc., or if he/she have some genetic predisposition  such as hemophilia. Bleeding into the spinal canal can cause compression of the spinal  cord with subsequent paralysis.  This would require an emergency  surgery to  decompress and there are no guarantees that the patient would recover from the  paralysis.       5. Pneumothorax:  Puncturing of a lung is a possibility, every time a needle is introduced in  the area of the chest or upper back.  Pneumothorax refers to free air around the  collapsed lung(s), inside of the thoracic cavity (chest cavity).  Another two possible  complications related to a similar event would include: Hemothorax and Chylothorax.   These are variations of the Pneumothorax, where instead of air around the collapsed  lung(s), you may have blood or chyle, respectively.       6. Spinal headaches: They may occur with any procedures in the area of the spine.       7. Persistent CSF (Cerebro-Spinal Fluid) leakage: This is a rare problem, but may occur  with prolonged intrathecal or epidural catheters either due to the formation of a fistulous  track or a dural tear.       8. Nerve damage: By working so close to the spinal cord, there is always a possibility of  nerve damage, which could be as serious as a permanent spinal cord injury with  paralysis.       9. Death:  Although rare, severe deadly allergic reactions known as "Anaphylactic  reaction" can occur to any of the medications used.      10. Worsening of the symptoms:  We can always make thing worse.  What are the chances  of something like this happening? Chances of any of this occuring are extremely low.  By statistics, you have more of a chance of getting killed in a motor vehicle accident: while driving to the hospital than any of the above occurring .  Nevertheless, you should be aware that they are possibilities.  In general, it is similar to taking a shower.  Everybody knows that you can slip, hit your head and get killed.  Does that mean that you should not shower again?  Nevertheless always keep in mind that statistics do not mean anything if you happen to be on the wrong side of them.  Even if a procedure has a 1 (one) in a  1,000,000 (million) chance of going wrong, it you happen to be that one..Also, keep in mind that by statistics, you have more of a chance of having something go wrong when taking medications.  Who should not have this procedure? If you are on a blood thinning medication (e.g. Coumadin, Plavix, see list of "Blood Thinners"), or if you have an active infection going on, you should not have the procedure.  If you are taking any blood thinners, please inform your physician.  How should I prepare for this procedure? Do not eat or drink anything at least six hours prior to the procedure. Bring a driver with you .  It cannot be a taxi. Come accompanied by an adult that can drive you back, and that is strong enough to help you if your legs get weak or numb from the local anesthetic. Take all of your medicines the morning of the procedure with just enough water to swallow them. If you have diabetes, make sure that you are scheduled to have your procedure done first thing in the morning, whenever possible. If you have diabetes, take only half of your insulin dose and notify our nurse that you have done so as soon as you arrive at the clinic. If you are diabetic, but only take blood sugar pills (oral hypoglycemic), then do not take them on the morning of your procedure.  You may take them after you have had the procedure. Do not take aspirin or any aspirin-containing medications, at least eleven (11) days prior to the procedure.  They may prolong bleeding. Wear loose fitting clothing that may be easy to take off and that you would not mind if it got stained with Betadine or blood. Do not wear any jewelry or perfume Remove any nail coloring.  It will interfere with some of our monitoring equipment.  NOTE: Remember that this is not meant to be interpreted as a complete list of all possible complications.  Unforeseen problems may occur.  BLOOD THINNERS The following drugs contain aspirin or other products,  which can cause increased bleeding during surgery and should not be taken for 2 weeks prior to and 1 week after surgery.  If you should need take something for relief of minor pain, you may take acetaminophen which is found in Tylenol,m Datril, Anacin-3 and Panadol. It is not blood thinner. The products listed below are.  Do not take any of the products listed below in addition to any listed on your instruction sheet.  A.P.C or A.P.C with Codeine Codeine Phosphate Capsules #3 Ibuprofen Ridaura  ABC compound Congesprin Imuran rimadil  Advil Cope Indocin Robaxisal  Alka-Seltzer Effervescent Pain Reliever and Antacid Coricidin or Coricidin-D  Indomethacin Rufen  Alka-Seltzer plus Cold Medicine Cosprin Ketoprofen S-A-C Tablets  Anacin Analgesic Tablets or Capsules Coumadin  Korlgesic Salflex  Anacin Extra Strength Analgesic tablets or capsules CP-2 Tablets Lanoril Salicylate  Anaprox Cuprimine Capsules Levenox Salocol  Anexsia-D Dalteparin Magan Salsalate  Anodynos Darvon compound Magnesium Salicylate Sine-off  Ansaid Dasin Capsules Magsal Sodium Salicylate  Anturane Depen Capsules Marnal Soma  APF Arthritis pain formula Dewitt's Pills Measurin Stanback  Argesic Dia-Gesic Meclofenamic Sulfinpyrazone  Arthritis Bayer Timed Release Aspirin Diclofenac Meclomen Sulindac  Arthritis pain formula Anacin Dicumarol Medipren Supac  Analgesic (Safety coated) Arthralgen Diffunasal Mefanamic Suprofen  Arthritis Strength Bufferin Dihydrocodeine Mepro Compound Suprol  Arthropan liquid Dopirydamole Methcarbomol with Aspirin Synalgos  ASA tablets/Enseals Disalcid Micrainin Tagament  Ascriptin Doan's Midol Talwin  Ascriptin A/D Dolene Mobidin Tanderil  Ascriptin Extra Strength Dolobid Moblgesic Ticlid  Ascriptin with Codeine Doloprin or Doloprin with Codeine Momentum Tolectin  Asperbuf Duoprin Mono-gesic Trendar  Aspergum Duradyne Motrin or Motrin IB Triminicin  Aspirin plain, buffered or enteric coated  Durasal Myochrisine Trigesic  Aspirin Suppositories Easprin Nalfon Trillsate  Aspirin with Codeine Ecotrin Regular or Extra Strength Naprosyn Uracel  Atromid-S Efficin Naproxen Ursinus  Auranofin Capsules Elmiron Neocylate Vanquish  Axotal Emagrin Norgesic Verin  Azathioprine Empirin or Empirin with Codeine Normiflo Vitamin E  Azolid Emprazil Nuprin Voltaren  Bayer Aspirin plain, buffered or children's or timed BC Tablets or powders Encaprin Orgaran Warfarin Sodium  Buff-a-Comp Enoxaparin Orudis Zorpin  Buff-a-Comp with Codeine Equegesic Os-Cal-Gesic   Buffaprin Excedrin plain, buffered or Extra Strength Oxalid   Bufferin Arthritis Strength Feldene Oxphenbutazone   Bufferin plain or Extra Strength Feldene Capsules Oxycodone with Aspirin   Bufferin with Codeine Fenoprofen Fenoprofen Pabalate or Pabalate-SF   Buffets II Flogesic Panagesic   Buffinol plain or Extra Strength Florinal or Florinal with Codeine Panwarfarin   Buf-Tabs Flurbiprofen Penicillamine   Butalbital Compound Four-way cold tablets Penicillin   Butazolidin Fragmin Pepto-Bismol   Carbenicillin Geminisyn Percodan   Carna Arthritis Reliever Geopen Persantine   Carprofen Gold's salt Persistin   Chloramphenicol Goody's Phenylbutazone   Chloromycetin Haltrain Piroxlcam   Clmetidine heparin Plaquenil   Cllnoril Hyco-pap Ponstel   Clofibrate Hydroxy chloroquine Propoxyphen         Before stopping any of these medications, be sure to consult the physician who ordered them.  Some, such as Coumadin (Warfarin) are ordered to prevent or treat serious conditions such as "deep thrombosis", "pumonary embolisms", and other heart problems.  The amount of time that you may need off of the medication may also vary with the medication and the reason for which you were taking it.  If you are taking any of these medications, please make sure you notify your pain physician before you undergo any procedures.         Moderate Conscious  Sedation, Adult Sedation is the use of medicines to help you relax and not feel pain. Moderate conscious sedation is a type of sedation that makes you less alert than normal. You are still able to respond to instructions, touch, or both. This type of sedation is used during short medical and dental procedures. It is milder than deep sedation, which is a type of sedation you cannot be easily woken up from. It is also milder than general anesthesia, which is the use of medicines to make you fall asleep. Moderate conscious sedation lets you return to your normal activities sooner. Tell a health care provider about: Any allergies you have. All medicines you are taking, including vitamins, herbs, steroids, eye drops, creams, and over-the-counter medicines. Any problems you or family members have had with anesthesia.  Any bleeding problems you have. Any surgeries you have had. Any medical conditions you have. Whether you are pregnant or may be pregnant. Any recent alcohol, tobacco, or drug use. What are the risks? Your health care provider will talk with you about risks. These may include: Oversedation. This is when you get too much medicine. Nausea or vomiting. Allergic reaction to medicines. Trouble breathing. If this happens, a breathing tube may be used. It will be removed when you can breathe better on your own. Heart trouble. Lung trouble. Emergence delirium. This is when you feel confused while the sedation wears off. This gets better with time. What happens before the procedure? When to stop eating and drinking Follow instructions from your health care provider about what you may eat and drink. These may include: 8 hours before your procedure Stop eating most foods. Do not eat meat, fried foods, or fatty foods. Eat only light foods, such as toast or crackers. All liquids are okay except energy drinks and alcohol. 6 hours before your procedure Stop eating. Drink only clear liquids, such  as water, clear fruit juice, black coffee, plain tea, and sports drinks. Do not drink energy drinks or alcohol. 2 hours before your procedure Stop drinking all liquids. You may be allowed to take medicines with small sips of water. If you do not follow your health care provider's instructions, your procedure may be delayed or canceled. Medicines Ask your health care provider about: Changing or stopping your regular medicines. These include any diabetes medicines or blood thinners you take. Taking medicines such as aspirin and ibuprofen. These medicines can thin your blood. Do not take them unless your health care provider tells you to. Taking over-the-counter medicines, vitamins, herbs, and supplements. Tests and exams You may have an exam or testing. You may have a blood or urine sample taken. General instructions Do not use any products that contain nicotine or tobacco for at least 4 weeks before the procedure. These products include cigarettes, chewing tobacco, and vaping devices, such as e-cigarettes. If you need help quitting, ask your health care provider. If you will be going home right after the procedure, plan to have a responsible adult: Take you home from the hospital or clinic. You will not be allowed to drive. Care for you for the time you are told. What happens during the procedure?  You will be given the sedative. It may be given: As a pill you can take by mouth. It can also be put into the rectum. As a spray through the nose. As an injection into muscle. As an injection into a vein through an IV. You may be given oxygen as needed. Your blood pressure, heart rate, breathing rate, and blood oxygen level will be monitored during the procedure. The medical or dental procedure will be done. The procedure may vary among health care providers and hospitals. What happens after the procedure? Your blood pressure, heart rate, breathing rate, and blood oxygen level will be  monitored until you leave the hospital or clinic. You will get fluids through an IV as needed. Do not drive or operate machinery until your health care provider says that it is safe. This information is not intended to replace advice given to you by your health care provider. Make sure you discuss any questions you have with your health care provider. Document Revised: 09/13/2021 Document Reviewed: 09/13/2021 [employer] Patient Education  2024 [employer] Facet Blocks Patient Information  Description: The facets are joints in the spine between  the vertebrae.  Like any joints in the body, facets can become irritated and painful.  Arthritis can also effect the facets.  By injecting steroids and local anesthetic in and around these joints, we can temporarily block the nerve supply to them.  Steroids act directly on irritated nerves and tissues to reduce selling and inflammation which often leads to decreased pain.  Facet blocks may be done anywhere along the spine from the neck to the low back depending upon the location of your pain.   After numbing the skin with local anesthetic (like Novocaine), a small needle is passed onto the facet joints under x-ray guidance.  You may experience a sensation of pressure while this is being done.  The entire block usually lasts about 15-25 minutes.   Conditions which may be treated by facet blocks:  Low back/buttock pain Neck/shoulder pain Certain types of headaches  Preparation for the injection:  Do not eat any solid food or dairy products within 8 hours of your appointment. You may drink clear liquid up to 3 hours before appointment.  Clear liquids include water, black coffee, juice or soda.  No milk or cream please. You may take your regular medication, including pain medications, with a sip of water before your appointment.  Diabetics should hold regular insulin (if taken separately) and take 1/2 normal NPH dose the morning of the procedure.  Carry some  sugar containing items with you to your appointment. A driver must accompany you and be prepared to drive you home after your procedure. Bring all your current medications with you. An IV may be inserted and sedation may be given at the discretion of the physician. A blood pressure cuff, EKG and other monitors will often be applied during the procedure.  Some patients may need to have extra oxygen administered for a short period. You will be asked to provide medical information, including your allergies and medications, prior to the procedure.  We must know immediately if you are taking blood thinners (like Coumadin/Warfarin) or if you are allergic to IV iodine contrast (dye).  We must know if you could possible be pregnant.  Possible side-effects:  Bleeding from needle site Infection (rare, may require surgery) Nerve injury (rare) Numbness & tingling (temporary) Difficulty urinating (rare, temporary) Spinal headache (a headache worse with upright posture) Light-headedness (temporary) Pain at injection site (serveral days) Decreased blood pressure (rare, temporary) Weakness in arm/leg (temporary) Pressure sensation in back/neck (temporary)   Call if you experience:  Fever/chills associated with headache or increased back/neck pain Headache worsened by an upright position New onset, weakness or numbness of an extremity below the injection site Hives or difficulty breathing (go to the emergency room) Inflammation or drainage at the injection site(s) Severe back/neck pain greater than usual New symptoms which are concerning to you  Please note:  Although the local anesthetic injected can often make your back or neck feel good for several hours after the injection, the pain will likely return. It takes 3-7 days for steroids to work.  You may not notice any pain relief for at least one week.  If effective, we will often do a series of 2-3 injections spaced 3-6 weeks apart to maximally  decrease your pain.  After the initial series, you may be a candidate for a more permanent nerve block of the facets.  If you have any questions, please call #[phone] [facility]

## 2023-09-14 NOTE — Progress Notes (Signed)
 Safety precautions to be maintained throughout the outpatient stay will include: orient to surroundings, keep bed in low position, maintain call bell within reach at all times, provide assistance with transfer out of bed and ambulation.

## 2023-09-14 NOTE — Progress Notes (Signed)
 PROVIDER NOTE: Interpretation of information contained herein should be left to medically-trained personnel. Specific patient instructions are provided elsewhere under Patient Instructions section of medical record. This document was created in part using AI and STT-dictation technology, any transcriptional errors that may result from this process are unintentional.  Patient: Bobby Mcbride  Service: E/M Encounter  Provider: Wallie Sherry, MD  DOB: 1970-07-12  Delivery: Face-to-face  Specialty: Interventional Pain Management  MRN: [id number]  Setting: Ambulatory outpatient facility  Specialty designation: 09  Type: New Patient  Location: Outpatient office facility  PCP: Mcbride Lynwood HERO, NP  DOS: 09/14/2023    Referring Prov.: Clois Fret, MD   Primary Reason(s) for Visit: Encounter for initial evaluation of one or more chronic problems (new to examiner) potentially causing chronic pain, and posing a threat to normal musculoskeletal function. (Level of risk: High) CC: Back Pain (lower)  HPI  Bobby Mcbride is a 53 y.o. year old, male patient, who comes for the first time to our practice referred by Clois Fret, MD for our initial evaluation of his chronic pain. He has Headache; Abdominal pain, other specified site; Hematemesis/vomiting blood; Renal cyst; Hemorrhoids; Anxiety; GERD (gastroesophageal reflux disease); History of stomach ulcers; Gastropathy; URI (upper respiratory infection); Back pain; Cough; Other fatigue; Nausea and vomiting; Preventative health care; Esophageal obstruction due to food impaction; Esophageal stricture; Near syncope; Elevated troponin; Paresthesia; Elevated troponin I level; Diaphoresis; Esophageal dysphagia; and Degeneration of intervertebral disc of lumbar region with discogenic back pain on their problem list. Today he comes in for evaluation of his Back Pain (lower)  Pain Assessment: Location: Lower, Right Back Radiating: down back of leg to behind knee Onset:  More than a month ago Duration: Chronic pain Quality: Sharp Severity: 8 /10 (subjective, self-reported pain score)  Effect on ADL: denies Timing: Constant Modifying factors: nothing BP: 123/77  HR: 63  Onset and Duration: Present longer than 3 months Cause of pain: Unknown Severity: NAS-11 at its worse: 10/10, NAS-11 at its best: 8/10, NAS-11 now: 8/10, and NAS-11 on the average: 8/10 Timing: Not influenced by the time of the day Aggravating Factors: Bending, Climbing, and Lifiting Alleviating Factors: rest Associated Problems: Pain that wakes patient up Quality of Pain: Hot and Uncomfortable Previous Examinations or Tests: MRI scan Previous Treatments: PT, ESI  Bobby Mcbride is being evaluated for possible interventional pain management therapies for the treatment of his chronic pain.  Discussed the use of AI scribe software for clinical note transcription with the patient, who gave verbal consent to proceed.  History of Present Illness   Bobby Mcbride is a 53 year old male with chronic low back pain and arthritis who presents for evaluation of pain management options. He was referred by Dr. Katrina for evaluation of chronic low back pain.  He experiences chronic low back pain primarily on the lower right side, attributed to arthritis. 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 Previous treatments, including cortisone shots and epidural injections, have provided no relief. Over-the-counter medications like ibuprofen have been ineffective, while Celebrex offers some relaxation but does not completely alleviate the pain. He takes Celebrex as needed, particularly when not working.  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 He has not engaged in physical therapy, which is a requirement by his insurance for further treatment options.  Recent blood work indicated he is close to being diabetic and has a  cholesterol level of 234 mg/dL.  He is attempting to improve his diet by switching to zero-sugar drinks.  No use of blood thinners.      Meds   Current Outpatient Medications:    celecoxib (CELEBREX) 100 MG capsule, Take 1 capsule (100 mg total) by mouth every 12 (twelve) hours as needed., Disp: 60 capsule, Rfl: 2   pantoprazole  (PROTONIX ) 40 MG tablet, Take 1 tablet (40 mg total) by mouth 2 (two) times daily before a meal., Disp: 60 tablet, Rfl: 6   pantoprazole  (PROTONIX ) 40 MG tablet, Take 1 tablet (40 mg total) by mouth 2 (two) times daily., Disp: 180 tablet, Rfl: 3  Imaging Review  Cervical Imaging: Cervical MR wo contrast: Results for orders placed during the hospital encounter of 04/07/10  MR Cervical Spine Wo Contrast  Narrative Clinical Data: 53 year old male with neck pain radiating down the right shoulder and upper extremity.  MRI CERVICAL SPINE WITHOUT CONTRAST  Technique:  Multiplanar and multiecho pulse sequences of the cervical spine, to include the craniocervical junction and cervicothoracic junction, were obtained according to standard protocol without intravenous contrast.  Comparison: [facility] cervical radiographs 03/22/2010.  Findings: Chronic / congenital appearing interbody fusion at C7-T1 as seen on the comparison radiographs; there is a vestigial disc space at the level.  Straightening of cervical lordosis above this level. No marrow edema or evidence of acute osseous abnormality. There is also a vestigial disc space at T2-T3, and there may also be interbody fusion here.  Cervicomedullary junction is within normal limits.   Visualized paraspinal soft tissues are within normal limits.  Spinal cord signal is within normal limits at all visualized levels.  C2-C3:  Negative.  C3-C4:  Negative.  C4-C5:  Circumferential disc osteophyte complex.  Left paracentral broad-based disc protrusion effaces the ventral CSF space.   There is minimal flattening of the ventral spinal cord.  AP thecal sac is approximately 7 mm.  Bilateral uncovertebral and mild facet hypertrophy.  Mild bilateral C5 foraminal stenosis.  C5-C6:  Circumferential disc osteophyte complex with mild left paracentral broad-based disc protrusion.  This effaces the ventral CSF space without significant spinal stenosis.  Left greater than right uncovertebral and facet hypertrophy.  Mild to moderate left C6 foraminal stenosis.  C6-C7:  Right eccentric circumferential disc osteophyte complex with right uncovertebral hypertrophy.  No spinal stenosis.  Mild to moderate right C7 foraminal stenosis.  C7-T1:  Vestigial disc space.  Interbody fusion better depicted on comparison.  No stenosis.  IMPRESSION: 1.  Mild multifactorial spinal stenosis at C4-C5 with minimal flattening of the ventral spinal cord.  No cord signal abnormality. 2.  Disc degeneration also at C5-C6 and C6-C7. 3.  Multifactorial mild to moderate foraminal stenosis at the bilateral C5, left C6, and right C7 nerve levels.  4.  Vestigial disc spaces at C7-T1 and T2-T3 with a degree of interbody fusion suspected at both levels.  Provider: Dorthea Number   Narrative CLINICAL DATA:  Left arm pain and paresthesias for the past 2 weeks.  EXAM: CT HEAD WITHOUT CONTRAST  CT CERVICAL SPINE WITHOUT CONTRAST  TECHNIQUE: Multidetector CT imaging of the head and cervical spine was performed following the standard protocol without intravenous contrast. Multiplanar CT image reconstructions of the cervical spine were also generated.  RADIATION DOSE REDUCTION: This exam was performed according to the departmental dose-optimization program which includes automated exposure control, adjustment of the mA and/or kV according to patient size and/or use of iterative reconstruction technique.  COMPARISON:  CT neck dated June 28, 2021. CT head dated Aug 12, 2011.  FINDINGS: CT HEAD  FINDINGS  Brain: No evidence of acute infarction, hemorrhage, hydrocephalus, extra-axial collection or mass lesion/mass effect.  Vascular: No hyperdense vessel or unexpected calcification.  Skull: Normal. Negative for fracture or focal lesion.  Sinuses/Orbits: No acute finding.  Other: None.  CT CERVICAL SPINE FINDINGS  Alignment: Unchanged reversal of the normal cervical lordosis. No significant listhesis.  Skull base and vertebrae: No acute fracture. No primary bone lesion or focal pathologic process. 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  Soft tissues and spinal canal: No prevertebral fluid or swelling. No visible canal hematoma.  Disc levels: Unchanged severe disc height loss and moderate uncovertebral hypertrophy at C4-C5. Unchanged mild-to-moderate disc height loss at C5-C6 and C6-C7 with moderate uncovertebral hypertrophy on the left at C5-C6 and on the right at C6-C7. Mild left neuroforaminal stenosis at C4-C5 and C5-C6. Moderate right neuroforaminal stenosis at C6-C7. No high-grade spinal canal stenosis.  Upper chest: Biapical pleuroparenchymal scarring.  Other: None.  IMPRESSION: 1. No acute intracranial abnormality. 2. No acute cervical spine fracture or traumatic listhesis. 3. 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   Electronically Signed By: Elsie ONEIDA Shoulder M.D. On: 08/04/2022 13:17   DG Cervical Spine Complete  Narrative Clinical Data:  Neck pain.  No injury. CERVICAL SPINE, 7-VIEWS: Findings:  Mild foraminal stenosis at C4-5 bilaterally.  Minimal cervical thoracic scoliosis convex left.  Spina bifida occulta defect T2.  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 IMPRESSION: Mild cervical kyphosis and minimal cervicothoracic junction scoliosis.  Degenerative blocked vertebrae C7-T1.  Mild foraminal stenosis C4-5.  Provider: Felecia Coventry    Narrative *RADIOLOGY  REPORT*  Clinical Data: Right shoulder pain for 5 months.  No previous relevant surgery.  MRI OF THE RIGHT SHOULDER WITHOUT CONTRAST  Technique:  Multiplanar, multisequence MR imaging was performed. No intravenous contrast was administered.  Comparison: None.  Findings: There is no significant shoulder joint effusion.  A moderate amount of fluid is present in the subacromial - subdeltoid bursa, especially laterally.  There is a small full-thickness insertional tear of the anterior leading edge of the supraspinatus tendon, best seen on the coronal and sagittal images.  There is no tendon retraction or muscular atrophy.  Mild underlying supraspinatus tendinosis is present.  The infraspinatus and subscapularis tendons appear normal.  The glenohumeral articular cartilage appears preserved.  There is no evidence of labral tear.  The biceps tendon is intact.  The acromion is type 1 with mild lateral downsloping.  The acromioclavicular joint appears normal.  IMPRESSION:  1.  Small full-thickness nonretracted insertional tear of the supraspinatus tendon associated with moderate fluid in the subacromial - subdeltoid bursa. 2.  No evidence of labral or biceps tendon tear.  Original Report Authenticated By: ELSIE WENDI PERONE, M.D.   Narrative Clinical Data: Mid back pain and radiculopathy.  MRI THORACIC SPINE WITHOUT CONTRAST  Technique:  Multiplanar and multiecho pulse sequences of the thoracic spine were obtained without intravenous contrast.  Comparison: None  Findings: There is a vertebral body anomaly in the mid thoracic spine with a hemivertebra on the right involving T7.  The vertebral bodies are normally aligned in the sagittal plane.  They demonstrate normal marrow signal except for a small hemangioma in T5.  The thoracic spinal cord demonstrates normal signal intensity. No syrinx.  There is a tiny central disc protrusion at T8-9.  Mild impression on the ventral  thecal sac.  The remaining intervertebral disc  spaces are unremarkable.  No spinal or foraminal stenosis.  IMPRESSION:  1.  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 2.  Tiny central disc protrusion at T8-9. 3.  Normal MR appearance of the thoracic spinal cord.  Provider: Dorthea Number  MR Lumbar Spine w/o contrast  Narrative CLINICAL DATA:  Low back pain, symptoms persist with > 6 wks treatment  EXAM: MRI LUMBAR SPINE WITHOUT CONTRAST  TECHNIQUE: Multiplanar, multisequence MR imaging of the lumbar spine was performed. No intravenous contrast was administered.  COMPARISON:  MR L Spine 05/28/19  FINDINGS: Segmentation: In keeping with prior numbering convention, there is lumbarization of S1 with the last well-formed disc space labeled S1-S2  Alignment:  Trace retrolisthesis of L4 on L5.  Vertebrae: No fracture, evidence of discitis, or bone lesion. Redemonstrated hypoplastic left sided posterior elements at S1, unchanged  Conus medullaris and cauda equina: Conus extends to the L2 level. Conus and cauda equina appear normal.  Paraspinal and other soft tissues: Negative.  Disc levels:  T12-L1: Only imaged in the sagittal plane. No evidence of high-grade spinal canal or neural foraminal stenosis.  L1-L2: Mild bilateral facet degenerative change. No significant disc bulge. No spinal canal narrowing. No neural foraminal narrowing.  L2-L3: Mild bilateral facet degenerative change. No significant disc bulge. No spinal canal narrowing. Mild left neural foraminal narrowing.  L3-L4: Mild bilateral facet degenerative change. No significant disc bulge. No spinal canal narrowing. No neural foraminal narrowing.  L4-L5: Mild bilateral facet degenerative change. No significant disc bulge. No spinal canal narrowing. No significant neural foraminal narrowing.  L5-S1: Moderate right facet degenerative change. Mild spinal canal narrowing. No neural foraminal  narrowing.  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  1. No acute osseous abnormality. 2. No significant change from prior exam. No evidence of high grade spinal canal or neuroforaminal narrowing.   Electronically Signed By: Lyndall Gore M.D. On: 12/21/2022 07:46   DG Lumbar Spine Complete  Narrative *RADIOLOGY REPORT*  Clinical Data: Low back pain after injury.  LUMBAR SPINE - COMPLETE 4+ VIEW  Comparison: None.  Findings: No fracture or spondylolisthesis is noted.  Disc spaces and posterior facet joints appear intact.  Surgical clips are noted in the pelvis. Six non-rib bearing lumbar type vertebral bodies are noted.  IMPRESSION: No significant abnormality seen in lumbar spine.   Original Report Authenticated By: Lynwood Landy Raddle.,  M.D.  DG Epidurography  Narrative *RADIOLOGY REPORT*  CLINICAL DATA:  Lumbosacral spondylosis without myelopathy. Displacement the L5-S1 lumbar disc.  Left L5 radiculitis. 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  LUMBAR EPIDURAL INJECTION: An interlaminar approach was performed on the left at L5-S1.  The overlying skin was cleansed and anesthetized.  A 20 gauge spinal needle was advanced using loss-of-resistance technique.  Injection of 2cc of Omnipaque  180 confirmed epidural placement.  There was no evidence for intravascular or intrathecal spread of contrast.  I then injected 120 mg of Depo-Medrol  and 3ml of 1% lidocaine .  The patient tolerated the procedure without evidence for complication. The patient  was observed for 20 minutes prior to discharge in stable neurologic condition.  FLUORO TIME:  57 seconds  IMPRESSIONS:  Technically successful first interlaminar epidural steroid injection on the left at L5-S1.   Original Report Authenticated By: Lonni Necessary,  M.D.   Narrative Please see progress note for X-ray impression.   Narrative CLINICAL DATA:  Left middle  finger injury, closed in door.  EXAM: LEFT HAND - COMPLETE 3+ VIEW  COMPARISON:  None Available.  FINDINGS: No acute bony abnormality. Specifically, no fracture, subluxation, or dislocation. Mild osteoarthritic changes in the IP joints, most pronounced in the left middle finger DIP joint. No radiopaque foreign bodies. Soft tissues are intact.  IMPRESSION: No acute bony abnormality.   Electronically Signed By: Franky Crease M.D. On: 04/30/2023 21:22   Complexity Note: Imaging results reviewed.                         ROS  Cardiovascular: No reported cardiovascular signs or symptoms such as High blood pressure, coronary artery disease, abnormal heart rate or rhythm, heart attack, blood thinner therapy or heart weakness and/or failure Pulmonary or Respiratory: No reported pulmonary signs or symptoms such as wheezing and difficulty taking a deep full breath (Asthma), difficulty blowing air out (Emphysema), coughing up mucus (Bronchitis), persistent dry cough, or temporary stoppage of breathing during sleep Neurological: No reported neurological signs or symptoms such as seizures, abnormal skin sensations, urinary and/or fecal incontinence, being born with an abnormal open spine and/or a tethered spinal cord Psychological-Psychiatric: No reported psychological or psychiatric signs or symptoms such as difficulty sleeping, anxiety, depression, delusions or hallucinations (schizophrenial), mood swings (bipolar disorders) or suicidal ideations or attempts Gastrointestinal: Reflux or heatburn Genitourinary: No reported renal or genitourinary signs or symptoms such as difficulty voiding or producing urine, peeing blood, non-functioning kidney, kidney stones, difficulty emptying the bladder, difficulty controlling the flow of urine, or chronic kidney disease Hematological: No reported  hematological signs or symptoms such as prolonged bleeding, low or poor functioning platelets, bruising or bleeding easily, hereditary bleeding problems, low energy levels due to low hemoglobin or being anemic Endocrine: No reported endocrine signs or symptoms such as high or low blood sugar, rapid heart rate due to high thyroid  levels, obesity or weight gain due to slow thyroid  or thyroid  disease Rheumatologic: No reported rheumatological signs and symptoms such as fatigue, joint pain, tenderness, swelling, redness, heat, stiffness, decreased range of motion, with or without associated rash Musculoskeletal: Negative for myasthenia gravis, muscular dystrophy, multiple sclerosis or malignant hyperthermia Work History: Working full time  Allergies  Bobby Mcbride is allergic to codeine, food, hydrocodone -acetaminophen , ibuprofen, and compazine  [prochlorperazine  edisylate].  Laboratory Chemistry Profile   Renal Lab Results  Component Value Date   BUN 12 06/11/2023   CREATININE 1.12 06/11/2023   GFR 75.02 08/03/2021   GFRAA >60 09/03/2018   GFRNONAA >60 06/11/2023   PROTEINUR NEGATIVE 08/04/2022     Electrolytes Lab Results  Component Value Date   NA 137 06/11/2023   K 4.2 06/11/2023   CL 100 06/11/2023   CALCIUM 9.1 06/11/2023     Hepatic Lab Results  Component Value Date   AST 20 06/11/2023   ALT 28 06/11/2023   ALBUMIN 4.4 06/11/2023   ALKPHOS 71 06/11/2023   LIPASE 10 (L) 04/30/2023     ID Lab Results  Component Value Date   HIV Non Reactive 08/04/2022   SARSCOV2NAA NEGATIVE 04/30/2023     Bone No results found for: VD25OH, CI874NY7UNU, CI6874NY7, CI7874NY7, 25OHVITD1, 25OHVITD2, 25OHVITD3, TESTOFREE, TESTOSTERONE   Endocrine Lab Results  Component Value Date   GLUCOSE 90 06/11/2023   GLUCOSEU NEGATIVE 08/04/2022   HGBA1C 5.4 08/04/2022   TSH 1.57 08/03/2021     Neuropathy Lab Results  Component Value Date   HGBA1C 5.4 08/04/2022   HIV Non  Reactive 08/04/2022  CNS No results found for: COLORCSF, APPEARCSF, RBCCOUNTCSF, WBCCSF, POLYSCSF, LYMPHSCSF, EOSCSF, PROTEINCSF, GLUCCSF, JCVIRUS, CSFOLI, IGGCSF, LABACHR, ACETBL   Inflammation (CRP: Acute  ESR: Chronic) Lab Results  Component Value Date   CRP <1.0 08/03/2021   ESRSEDRATE 5 08/03/2021   LATICACIDVEN 0.8 08/17/2011     Rheumatology No results found for: RF, ANA, LABURIC, URICUR, LYMEIGGIGMAB, LYMEABIGMQN, HLAB27   Coagulation Lab Results  Component Value Date   INR 0.98 08/26/2011   LABPROT 13.2 08/26/2011   APTT 28 08/26/2011   PLT 237 06/11/2023   DDIMER <0.27 02/03/2014     Cardiovascular Lab Results  Component Value Date   HGB 16.6 06/11/2023   HCT 46.7 06/11/2023     Screening Lab Results  Component Value Date   SARSCOV2NAA NEGATIVE 04/30/2023   HIV Non Reactive 08/04/2022     Cancer No results found for: CEA, CA125, LABCA2   Allergens No results found for: ALMOND, APPLE, ASPARAGUS, AVOCADO, BANANA, BARLEY, BASIL, BAYLEAF, GREENBEAN, LIMABEAN, WHITEBEAN, BEEFIGE, REDBEET, BLUEBERRY, BROCCOLI, CABBAGE, MELON, CARROT, CASEIN, CASHEWNUT, CAULIFLOWER, CELERY     Note: Lab results reviewed.  PFSH  Drug: Bobby Mcbride  reports no history of drug use. Alcohol:  reports no history of alcohol use. Tobacco:  reports that he has never smoked. He has never used smokeless tobacco. 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 Family: family history includes Asthma in his father; Colon polyps in his father; Diabetes  in his mother; Heart attack in his father; Heart failure in his father; Hypertension in his father and mother; Liver disease in his mother; Migraines in his mother; Prostate cancer in his father; Stroke in his mother.  Past Surgical History:  Procedure Laterality Date   APPENDECTOMY  1990   BALLOON DILATION N/A 02/01/2023   Procedure: BALLOON DILATION;  Surgeon: Avram Lupita BRAVO, MD;  Location: [facility] ENDOSCOPY;  Service: Gastroenterology;  Laterality: N/A;   BIOPSY  09/03/2018   Procedure: BIOPSY;  Surgeon: Charlanne Groom, MD;  Location: [facility] ENDOSCOPY;  Service: Endoscopy;;   BIOPSY  02/01/2023   Procedure: BIOPSY;  Surgeon: Avram Lupita BRAVO, MD;  Location: [facility] ENDOSCOPY;  Service: Gastroenterology;;   COLONOSCOPY WITH ESOPHAGOGASTRODUODENOSCOPY (EGD)  08/31/2021   by dr teressa   ELBOW SURGERY Right 1987   ESOPHAGOGASTRODUODENOSCOPY  08/18/2011   Procedure: ESOPHAGOGASTRODUODENOSCOPY (EGD);  Surgeon: Gordy CHRISTELLA Starch, MD;  Location: [facility];  Service: Gastroenterology;  Laterality: N/A;   ESOPHAGOGASTRODUODENOSCOPY  11/11/2011   Procedure: ESOPHAGOGASTRODUODENOSCOPY (EGD);  Surgeon: Lupita BRAVO Avram, MD;  Location: [facility];  Service: Endoscopy;  Laterality: N/A;   ESOPHAGOGASTRODUODENOSCOPY (EGD) WITH PROPOFOL  N/A 09/03/2018   Procedure: ESOPHAGOGASTRODUODENOSCOPY (EGD) WITH PROPOFOL ;  Surgeon: Charlanne Groom, MD;  Location: [facility] ENDOSCOPY;  Service: Endoscopy;  Laterality: N/A;   ESOPHAGOGASTRODUODENOSCOPY (EGD) WITH PROPOFOL  N/A 11/24/2021   Procedure: ESOPHAGOGASTRODUODENOSCOPY (EGD) WITH PROPOFOL ;  Surgeon: Abran Norleen SAILOR, MD;  Location: [facility];  Service: Gastroenterology;  Laterality: N/A;   ESOPHAGOGASTRODUODENOSCOPY (EGD) WITH PROPOFOL  N/A 02/01/2023   Procedure: ESOPHAGOGASTRODUODENOSCOPY (EGD) WITH PROPOFOL ;  Surgeon: Avram Lupita BRAVO, MD;  Location: [facility] ENDOSCOPY;  Service: Gastroenterology;  Laterality: N/A;   KNEE ARTHROSCOPY Right 02/26/2009   @[facility]  by dr c. blackman   MASS EXCISION  N/A 10/15/2021   Procedure: EXCISION OF PERIANAL MASS;  Surgeon: Teresa Lonni HERO, MD;  Location: [facility];  Service: General;  Laterality: N/A;   MULTIPLE TOOTH EXTRACTIONS     several ---  w/ IV sedation  last extraction 05/ 2023   RECTAL EXAM UNDER ANESTHESIA N/A 10/15/2021   Procedure: ANORECTAL EXAM UNDER ANESTHESIA;  Surgeon: Teresa Lonni HERO, MD;  Location: [facility];  Service: General;  Laterality: N/A;   SHOULDER ARTHROSCOPY W/ ROTATOR CUFF REPAIR Right 2012   SKIN BIOPSY N/A 10/15/2021   Procedure: BIOPSY OF GLUTEAL SKIN;  Surgeon: Teresa Lonni HERO, MD;  Location: [facility];  Service: General;  Laterality: N/A;   Active Ambulatory Problems    Diagnosis Date Noted   Headache 08/12/2011   Abdominal pain, other specified site 08/17/2011   Hematemesis/vomiting blood 08/17/2011   Renal cyst 08/23/2011   Hemorrhoids 09/01/2011   Anxiety 09/07/2011   GERD (gastroesophageal reflux disease)    History of stomach ulcers    Gastropathy 11/11/2011   URI (upper respiratory infection) 03/13/2012   Back pain 10/25/2012   Cough 08/05/2021   Other fatigue 08/05/2021   Nausea and vomiting 08/05/2021   Preventative health care 08/05/2021   Esophageal obstruction due to food impaction    Esophageal stricture    Near syncope 08/04/2022   Elevated troponin 08/04/2022   Paresthesia 08/05/2022   Elevated troponin I level 08/05/2022   Diaphoresis 08/05/2022   Esophageal dysphagia 02/01/2023   Degeneration of intervertebral disc of lumbar region with discogenic back pain 09/14/2023   Resolved Ambulatory Problems    Diagnosis Date Noted   WEAKNESS 03/10/2010   Headache 03/10/2010   Dysuria 03/10/2010   Flu 03/04/2011   Blood in stool 07/21/2011   Past Medical History:  Diagnosis Date   Arthritis    Dysphasia    Esophagitis, 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 In no apparent acute distress Vitals:   09/14/23 0847  BP: 123/77  Pulse: 63  Resp: 16  Temp: 97.7 F (36.5 C)  SpO2: 100%  Weight: 175 lb (79.4 kg)  Height: 5' 7 (1.702 m)   BMI Assessment: Estimated body mass index is 27.41 kg/m as calculated from the following:   Height as of this encounter: 5' 7 (1.702 m).   Weight as of this encounter: 175 lb (79.4 kg).  BMI interpretation table: BMI level Category Range association with higher incidence of chronic pain  <18 kg/m2 Underweight   18.5-24.9 kg/m2 Ideal body weight   25-29.9 kg/m2 Overweight Increased incidence by 20%  30-34.9 kg/m2 Obese (Class I) Increased incidence by 68%  35-39.9 kg/m2 Severe obesity (Class II) Increased incidence by 136%  >40 kg/m2 Extreme obesity (Class III) Increased incidence by 254%   Patient's current BMI Ideal Body weight  Body mass index is 27.41 kg/m. Ideal body weight: 66.1 kg (145 lb 11.6 oz) Adjusted ideal body weight: 71.4 kg (157 lb 6.9 oz)   BMI Readings from Last 4 Encounters:  09/14/23 27.41 kg/m  09/05/23 26.63 kg/m  05/31/23 26.63 kg/m  05/24/23 26.63 kg/m   Wt Readings from Last 4 Encounters:  09/14/23 175  lb (79.4 kg)  09/05/23 170 lb (77.1 kg)  05/31/23 170 lb (77.1 kg)  05/24/23 170 lb (77.1 kg)    Psych/Mental status: Alert, oriented x 3 (person, place, & time)       Eyes: PERLA Respiratory: No evidence of acute respiratory distress  Thoracic Spine Area Exam  Skin & Axial Inspection: No masses, redness, or swelling Alignment: Symmetrical Functional ROM: Unrestricted ROM Stability: No instability detected Muscle Tone/Strength: Functionally intact. No obvious neuro-muscular anomalies detected. Sensory  (Neurological): Unimpaired Muscle strength & Tone: No palpable anomalies Lumbar Spine Area Exam  Skin & Axial Inspection: No masses, redness, or swelling Alignment: Symmetrical Functional ROM: Pain restricted ROM       Stability: No instability detected Muscle Tone/Strength: Functionally intact. No obvious neuro-muscular anomalies detected. Sensory (Neurological): Musculoskeletal pain pattern Palpation: Complains of area being tender to palpation       Provocative Tests: Hyperextension/rotation test: (+) bilaterally for facet joint pain. Lumbar quadrant test (Kemp's test): (+) bilaterally for facet joint pain.  Gait & Posture Assessment  Ambulation: Unassisted Gait: Relatively normal for age and body habitus Posture: WNL  Lower Extremity Exam    Side: Right lower extremity  Side: Left lower extremity  Stability: No instability observed          Stability: No instability observed          Skin & Extremity Inspection: Skin color, temperature, and hair growth are WNL. No peripheral edema or cyanosis. No masses, redness, swelling, asymmetry, or associated skin lesions. No contractures.  Skin & Extremity Inspection: Skin color, temperature, and hair growth are WNL. No peripheral edema or cyanosis. No masses, redness, swelling, asymmetry, or associated skin lesions. No contractures.  Functional ROM: Unrestricted ROM                  Functional ROM: Unrestricted ROM                  Muscle Tone/Strength: Functionally intact. No obvious neuro-muscular anomalies detected.  Muscle Tone/Strength: Functionally intact. No obvious neuro-muscular anomalies detected.  Sensory (Neurological): Unimpaired        Sensory (Neurological): Unimpaired        DTR: Patellar: deferred today Achilles: deferred today Plantar: deferred today  DTR: Patellar: deferred today Achilles: deferred today Plantar: deferred today  Palpation: No palpable anomalies  Palpation: No palpable anomalies    Assessment   Primary Diagnosis & Pertinent Problem List: The primary encounter diagnosis was Lumbar facet arthropathy. Diagnoses of Lumbar spondylosis and Degeneration of intervertebral disc of lumbar region with discogenic back pain were also pertinent to this visit.  Visit Diagnosis (New problems to examiner): 1. Lumbar facet arthropathy   2. Lumbar spondylosis   3. Degeneration of intervertebral disc of lumbar region with discogenic back pain    Plan of Care (Initial workup plan)   Bobby Mcbride has a history of greater than 3 months of moderate to severe pain which is resulted in functional impairment.  The patient has tried various conservative therapeutic options such as NSAIDs, Tylenol , muscle relaxants, physical therapy which was inadequately effective.  Patient's pain is predominantly axial with physical exam and L-MRi findings suggestive of facet arthropathy. Lumbar facet medial branch nerve blocks were discussed with the patient.  Risks and benefits were reviewed.  Patient would like to proceed with bilateral L3, L4, L5 medial branch nerve block. Recommend trial of Celebrex as below. Hold off on Ibuprofen   Procedure Orders  LUMBAR FACET(MEDIAL BRANCH NERVE BLOCK) MBNB    Pharmacotherapy (current): Medications ordered:  Meds ordered this encounter  Medications   celecoxib (CELEBREX) 100 MG capsule    Sig: Take 1 capsule (100 mg total) by mouth every 12 (twelve) hours as needed.    Dispense:  60 capsule    Refill:  2   Medications administered during this visit: Randeep E. Larsson Tim had no medications administered during this visit.    Interventional management options: Mr. Blume was informed that there is no guarantee that he would be a candidate for interventional therapies. The decision will be based on the results of diagnostic studies, as well as Mr. Hofacker's risk profile.  Procedure(s) under consideration:  Lumbar RFA Lumbar Sprint peripheral nerve stimulation     Provider-requested follow-up: Return in about 18 days (around 10/02/2023) for B/L L3, 4, 5 MBNB #1, in clinic IV Versed .  Future Appointments  Date Time Provider Department Center  10/05/2023 10:00 AM Dewaine, Morocho, NP [facility]  10/25/2023  2:40 PM Abran Norleen SAILOR, MD [facility]   I discussed the assessment and treatment plan with the patient. The patient was provided an opportunity to ask questions and all were answered. The patient agreed with the plan and demonstrated an understanding of the instructions.  Patient advised to call back or seek an in-person evaluation if the symptoms or condition worsens.  Duration of encounter: .  Total time on encounter, as per AMA guidelines included both the face-to-face and non-face-to-face time personally spent by the physician and/or other qualified health care professional(s) on the day of the encounter (includes time in activities that require the physician or other qualified health care professional and does not include time in activities normally performed by clinical staff). Physician's time may include the following activities when performed: Preparing to see the patient (e.g., pre-charting review of records, searching for previously ordered imaging, lab work, and nerve conduction tests) Review of prior analgesic pharmacotherapies. Reviewing PMP Interpreting ordered tests (e.g., lab work, imaging, nerve conduction tests) Performing post-procedure evaluations, including interpretation of diagnostic procedures Obtaining and/or reviewing separately obtained history Performing a medically appropriate examination and/or evaluation Counseling and educating the patient/family/caregiver Ordering medications, tests, or procedures Referring and communicating with other health care professionals (when not separately reported) Documenting clinical information in the electronic or other health record Independently interpreting results  (not separately reported) and communicating results to the patient/ family/caregiver Care coordination (not separately reported)  Note by: Wallie Sherry, MD (TTS and AI technology used. I apologize for any typographical errors that were not detected and corrected.) Date: 09/14/2023; Time: 10:29 AM

## 2023-10-02 ENCOUNTER — Ambulatory Visit
Admission: RE | Admit: 2023-10-02 | Discharge: 2023-10-02 | Disposition: A | Discharge status: Home / Self Care | Source: Ambulatory Visit | Attending: Student in an Organized Health Care Education/Training Program | Admitting: Student in an Organized Health Care Education/Training Program

## 2023-10-02 ENCOUNTER — Ambulatory Visit (HOSPITAL_BASED_OUTPATIENT_CLINIC_OR_DEPARTMENT_OTHER): Admitting: Student in an Organized Health Care Education/Training Program

## 2023-10-02 ENCOUNTER — Encounter: Payer: Self-pay | Admitting: Student in an Organized Health Care Education/Training Program

## 2023-10-02 DIAGNOSIS — M47816 Spondylosis without myelopathy or radiculopathy, lumbar region: Secondary | ICD-10-CM | POA: Insufficient documentation

## 2023-10-02 DIAGNOSIS — M5136 Other intervertebral disc degeneration, lumbar region with discogenic back pain only: Secondary | ICD-10-CM | POA: Insufficient documentation

## 2023-10-02 MED ORDER — LIDOCAINE HCL 2 % IJ SOLN
20.0000 mL | Freq: Once | INTRAMUSCULAR | Status: AC
  Administered 2023-10-02: 400 mg
  Filled 2023-10-02: qty 40

## 2023-10-02 MED ORDER — ROPIVACAINE HCL 2 MG/ML IJ SOLN
18.0000 mL | Freq: Once | INTRAMUSCULAR | Status: AC
  Administered 2023-10-02: 18 mL via PERINEURAL
  Filled 2023-10-02: qty 20

## 2023-10-02 MED ORDER — MIDAZOLAM HCL 2 MG/2ML IJ SOLN
0.5000 mg | Freq: Once | INTRAMUSCULAR | Status: AC
  Administered 2023-10-02: 2 mg via INTRAVENOUS
  Filled 2023-10-02: qty 2

## 2023-10-02 MED ORDER — DEXAMETHASONE SODIUM PHOSPHATE 10 MG/ML IJ SOLN
20.0000 mg | Freq: Once | INTRAMUSCULAR | Status: AC
  Administered 2023-10-02: 20 mg
  Filled 2023-10-02: qty 2

## 2023-10-02 MED ORDER — LACTATED RINGERS IV SOLN: Freq: Once | INTRAVENOUS | Status: AC

## 2023-10-02 NOTE — Progress Notes (Signed)
 Safety precautions to be maintained throughout the outpatient stay will include: orient to surroundings, keep bed in low position, maintain call bell within reach at all times, provide assistance with transfer out of bed and ambulation.

## 2023-10-02 NOTE — Patient Instructions (Signed)

## 2023-10-02 NOTE — Progress Notes (Signed)
 PROVIDER NOTE: Interpretation of information contained herein should be left to medically-trained personnel. Specific patient instructions are provided elsewhere under Patient Instructions section of medical record. This document was created in part using STT-dictation technology, any transcriptional errors that may result from this process are unintentional.  Patient: Bobby Mcbride Type: Established DOB: 02-Mar-1971 MRN: [id number] PCP: Crandall Lynwood HERO, NP  Service: Procedure DOS: 10/02/2023 Setting: Ambulatory Location: Ambulatory outpatient facility Delivery: Face-to-face Provider: Wallie Sherry, MD Specialty: Interventional Pain Management Specialty designation: 09 Location: Outpatient facility Ref. Prov.: Crandall Lynwood HERO, NP       Interventional Therapy   Type: Lumbar Facet, Medial Branch Block(s) (w/ fluoroscopic mapping) #1  Laterality: Bilateral  Level: L3, L4, and L5 Medial Branch/Dorsal Rami Level(s). Injecting these levels blocks the L3-4 and L4-5 lumbar facet joints. Imaging: Fluoroscopic guidance Spinal ([id number]) Anesthesia: Local anesthesia (1-2% Lidocaine ) Sedation: Minimal Sedation                       DOS: 10/02/2023 Performed by: Wallie Sherry, MD  Primary Purpose: Diagnostic/Therapeutic Indications: Low back pain severe enough to impact quality of life or function. 1. Lumbar facet arthropathy   2. Lumbar spondylosis   3. Degeneration of intervertebral disc of lumbar region with discogenic back pain    NAS-11 Pain score:   Pre-procedure: 8 /10   Post-procedure: 7 /10     Position / Prep / Materials:  Position: Prone  Prep solution: ChloraPrep (2% chlorhexidine gluconate and 70% isopropyl alcohol) Area Prepped: Posterolateral Lumbosacral Spine (Wide prep: From the lower border of the scapula down to the end of the tailbone and from flank to flank.)  Materials:  Tray: Block Needle(s):  Type: Spinal  Gauge (G): 22  Length: 3.5-in Qty: 2     H&P (Pre-op  Assessment):  Bobby Mcbride is a 53 y.o. (year old), male patient, seen today for interventional treatment. He  has a past surgical history that includes Elbow surgery (Right, 1987); Appendectomy (1990); Knee arthroscopy (Right, 02/26/2009); Shoulder arthroscopy w/ rotator cuff repair (Right, 2012); Esophagogastroduodenoscopy (08/18/2011); Esophagogastroduodenoscopy (11/11/2011); Esophagogastroduodenoscopy (egd) with propofol  (N/A, 09/03/2018); biopsy (09/03/2018); Colonoscopy with esophagogastroduodenoscopy (egd) (08/31/2021); Multiple tooth extractions; Mass excision (N/A, 10/15/2021); Skin biopsy (N/A, 10/15/2021); Rectal exam under anesthesia (N/A, 10/15/2021); Esophagogastroduodenoscopy (egd) with propofol  (N/A, 11/24/2021); Esophagogastroduodenoscopy (egd) with propofol  (N/A, 02/01/2023); biopsy (02/01/2023); and Balloon dilation (N/A, 02/01/2023). Bobby Mcbride has a current medication list which includes the following prescription(s): celecoxib , pantoprazole , and pantoprazole , and the following Facility-Administered Medications: lactated ringers . His primarily concern today is the Back Pain  Initial Vital Signs:  Pulse/HCG Rate: 83ECG Heart Rate: 76 Temp: (!) 97.3 F (36.3 C) Resp: 18 BP: (!) 141/86 SpO2: 99 %  BMI: Estimated body mass index is 27.41 kg/m as calculated from the following:   Height as of this encounter: 5' 7 (1.702 m).   Weight as of this encounter: 175 lb (79.4 kg).  Risk Assessment: Allergies: Reviewed. He is allergic to codeine, food, hydrocodone -acetaminophen , ibuprofen, and compazine  [prochlorperazine  edisylate].  Allergy Precautions: None required Coagulopathies: Reviewed. None identified.  Blood-thinner therapy: None at this time Active Infection(s): Reviewed. None identified. Bobby Mcbride is afebrile  Site Confirmation: Bobby Mcbride was asked to confirm the procedure and laterality before marking the site Procedure checklist: Completed Consent: Before the procedure and under the  influence of no sedative(s), amnesic(s), or anxiolytics, the patient was informed of the treatment options, risks and possible complications. To fulfill our ethical and legal obligations, as recommended by the  American Medical Association's Code of Ethics, I have informed the patient of my clinical impression; the nature and purpose of the treatment or procedure; the risks, benefits, and possible complications of the intervention; the alternatives, including doing nothing; the risk(s) and benefit(s) of the alternative treatment(s) or procedure(s); and the risk(s) and benefit(s) of doing nothing. The patient was provided information about the general risks and possible complications associated with the procedure. These may include, but are not limited to: failure to achieve desired goals, infection, bleeding, organ or nerve damage, allergic reactions, paralysis, and death. In addition, the patient was informed of those risks and complications associated to Spine-related procedures, such as failure to decrease pain; infection (i.e.: Meningitis, epidural or intraspinal abscess); bleeding (i.e.: epidural hematoma, subarachnoid hemorrhage, or any other type of intraspinal or peri-dural bleeding); organ or nerve damage (i.e.: Any type of peripheral nerve, nerve root, or spinal cord injury) with subsequent damage to sensory, motor, and/or autonomic systems, resulting in permanent pain, numbness, and/or weakness of one or several areas of the body; allergic reactions; (i.e.: anaphylactic reaction); and/or death. Furthermore, the patient was informed of those risks and complications associated with the medications. These include, but are not limited to: allergic reactions (i.e.: anaphylactic or anaphylactoid reaction(s)); adrenal axis suppression; blood sugar elevation that in diabetics may result in ketoacidosis or comma; water retention that in patients with history of congestive heart failure may result in shortness of  breath, pulmonary edema, and decompensation with resultant heart failure; weight gain; swelling or edema; medication-induced neural toxicity; particulate matter embolism and blood vessel occlusion with resultant organ, and/or nervous system infarction; and/or aseptic necrosis of one or more joints. Finally, the patient was informed that Medicine is not an exact science; therefore, there is also the possibility of unforeseen or unpredictable risks and/or possible complications that may result in a catastrophic outcome. The patient indicated having understood very clearly. We have given the patient no guarantees and we have made no promises. Enough time was given to the patient to ask questions, all of which were answered to the patient's satisfaction. Mr. Nakanishi has indicated that he wanted to continue with the procedure. Attestation: I, the ordering provider, attest that I have discussed with the patient the benefits, risks, side-effects, alternatives, likelihood of achieving goals, and potential problems during recovery for the procedure that I have provided informed consent. Date  Time: 10/02/2023  8:46 AM  Pre-Procedure Preparation:  Monitoring: As per clinic protocol. Respiration, ETCO2, SpO2, BP, heart rate and rhythm monitor placed and checked for adequate function Safety Precautions: Patient was assessed for positional comfort and pressure points before starting the procedure. Time-out: I initiated and conducted the Time-out before starting the procedure, as per protocol. The patient was asked to participate by confirming the accuracy of the Time Out information. Verification of the correct person, site, and procedure were performed and confirmed by me, the nursing staff, and the patient. Time-out conducted as per Joint Commission's Universal Protocol (UP.01.01.01). Time: 1010 Start Time: 1010 hrs.  Description of Procedure:          Laterality: (see above) Targeted Levels: (see  above)  Safety Precautions: Aspiration looking for blood return was conducted prior to all injections. At no point did we inject any substances, as a needle was being advanced. Before injecting, the patient was told to immediately notify me if he was experiencing any new onset of ringing in the ears, or metallic taste in the mouth. No attempts were made at seeking any paresthesias.  Safe injection practices and needle disposal techniques used. Medications properly checked for expiration dates. SDV (single dose vial) medications used. After the completion of the procedure, all disposable equipment used was discarded in the proper designated medical waste containers. Local Anesthesia: Protocol guidelines were followed. The patient was positioned over the fluoroscopy table. The area was prepped in the usual manner. The time-out was completed. The target area was identified using fluoroscopy. A 12-in long, straight, sterile hemostat was used with fluoroscopic guidance to locate the targets for each level blocked. Once located, the skin was marked with an approved surgical skin marker. Once all sites were marked, the skin (epidermis, dermis, and hypodermis), as well as deeper tissues (fat, connective tissue and muscle) were infiltrated with a small amount of a short-acting local anesthetic, loaded on a 10cc syringe with a 25G, 1.5-in  Needle. An appropriate amount of time was allowed for local anesthetics to take effect before proceeding to the next step. Local Anesthetic: Lidocaine  2.0% The unused portion of the local anesthetic was discarded in the proper designated containers. Technical description of process:  Medial Branch  Dorsal Rami Nerve Block (MBB):  Neuroanatomy note: Each lumbar facet joint receives dual innervation from medial branches arising from the posterior primary rami at the same level and one level above. The target for each lumbar medial branch is the junction of the ipsilateral superior  articular and transverse process of the lower vertebral body. (i.e.: The L4-L5 facet joint is innervated by the L4 medial branch [located at L5] and the L3 medial branch [located at L4]. Blocking the L4 Medial Branch is therefore achieved by injecting at the junction of the ipsilateral superior articular and transverse process of the lower vertebral body [L5].).  Exception: The exception to the above rule is the L5-S1 facet joint which has triple innervation requiring the L4 medial branch, as well as the L5 and the S1 Dorsal Rami(s) to be blocked to fully denervate the joint.  Under fluoroscopic guidance, a needle was inserted until contact was made with os over the target area. After negative aspiration, 2mL of the nerve block solution was injected without difficulty or complication. Paresthesia were avoided during injection. The needle(s) were removed intact and without complication.  Once the entire procedure was completed, the treated area was cleaned, making sure to leave some of the prepping solution back to take advantage of its long term bactericidal properties.         Illustration of the posterior view of the lumbar spine and the posterior neural structures. Laminae of L2 through S1 are labeled. DPRL5, dorsal primary ramus of L5; DPRS1, dorsal primary ramus of S1; DPR3, dorsal primary ramus of L3; FJ, facet (zygapophyseal) joint L3-L4; I, inferior articular process of L4; LB1, lateral branch of dorsal primary ramus of L1; IAB, inferior articular branches from L3 medial branch (supplies L4-L5 facet joint); IBP, intermediate branch plexus; MB3, medial branch of dorsal primary ramus of L3; NR3, third lumbar nerve root; S, superior articular process of L5; SAB, superior articular branches from L4 (supplies L4-5 facet joint also); TP3, transverse process of L3.   Facet Joint Innervation (* possible contribution)  L1-2 T12, L1 (L2*)  Medial Branch  L2-3 L1, L2 (L3*)                     L3-4  L2, L3 (L4*)                     L4-5  L3, L4 (L5*)                     L5-S1 L4, L5, S1                        Vitals:   10/02/23 1010 10/02/23 1015 10/02/23 1018 10/02/23 1028  BP: 129/87 129/78 (!) 118/94 118/81  Pulse:    69  Resp: 17 15 18 14   Temp:      SpO2: 96% 95% 96% 97%  Weight:      Height:         End Time: 1018 hrs.  Imaging Guidance (Spinal):         Type of Imaging Technique: Fluoroscopy Guidance (Spinal) Indication(s): Fluoroscopy guidance for needle placement to enhance accuracy in procedures requiring precise needle localization for targeted delivery of medication in or near specific anatomical locations not easily accessible without such real-time imaging assistance. Exposure Time: Please see nurses notes. Contrast: None used. Fluoroscopic Guidance: I was personally present during the use of fluoroscopy. Tunnel Vision Technique used to obtain the best possible view of the target area. Parallax error corrected before commencing the procedure. Direction-depth-direction technique used to introduce the needle under continuous pulsed fluoroscopy. Once target was reached, antero-posterior, oblique, and lateral fluoroscopic projection used confirm needle placement in all planes. Images permanently stored in EMR. Interpretation: No contrast injected. I personally interpreted the imaging intraoperatively. Adequate needle placement confirmed in multiple planes. Permanent images saved into the patient's record.  Post-operative Assessment:  Post-procedure Vital Signs:  Pulse/HCG Rate: 6967 Temp: (!) 97.3 F (36.3 C) Resp: 14 BP: 118/81 SpO2: 97 %  EBL: None  Complications: No immediate post-treatment complications observed by team, or reported by patient.  Note: The patient tolerated the entire procedure well. A repeat set of vitals were taken after the procedure and the patient was kept under observation following institutional policy, for this type of  procedure. Post-procedural neurological assessment was performed, showing return to baseline, prior to discharge. The patient was provided with post-procedure discharge instructions, including a section on how to identify potential problems. Should any problems arise concerning this procedure, the patient was given instructions to immediately contact us , at any time, without hesitation. In any case, we plan to contact the patient by telephone for a follow-up status report regarding this interventional procedure.  Comments:  No additional relevant information.  Plan of Care (POC)  Orders:  Orders Placed This Encounter  Procedures   DG PAIN CLIN[facility] C-ARM 1-60 MIN NO REPORT    Intraoperative interpretation by procedural physician at [facility].    Standing Status:   Standing    Number of Occurrences:   1    Reason for exam::   Assistance in needle guidance and placement for procedures requiring needle placement in or near specific anatomical locations not easily accessible without such assistance.    Medications ordered for procedure: Meds ordered this encounter  Medications   lidocaine  (XYLOCAINE ) 2 % (with pres) injection 400 mg   lactated ringers  infusion   midazolam  (VERSED ) injection 0.5-2 mg    Make sure Flumazenil is available in the pyxis when using this medication. If oversedation occurs, administer 0.2 mg IV over 15 sec. If after 45 sec no response, administer 0.2 mg again over 1 min; may repeat at 1 min intervals; not to exceed 4 doses (1 mg)   ropivacaine  (PF) 2 mg/mL (0.2%) (NAROPIN ) injection 18 mL   dexamethasone  (  DECADRON ) injection 20 mg   Medications administered: We administered lidocaine , lactated ringers , midazolam , ropivacaine  (PF) 2 mg/mL (0.2%), and dexamethasone .  See the medical record for exact dosing, route, and time of administration.    B/L L3-5 MBNB 10/02/23    Follow-up plan:   Return in about 4 weeks (around 10/30/2023) for PPE, F2F.      Recent Visits Date Type Provider Dept  09/14/23 Office Visit Marcelino Nurse, MD [facility]  Showing recent visits within past 90 days and meeting all other requirements Today's Visits Date Type Provider Dept  10/02/23 Procedure visit Marcelino Nurse, MD [facility]  Showing today's visits and meeting all other requirements Future Appointments Date Type Provider Dept  10/31/23 Appointment Marcelino Nurse, MD [facility]  Showing future appointments within next 90 days and meeting all other requirements   Disposition: Discharge home  Discharge (Date  Time): 10/02/2023; 1035 hrs.   Primary Care Physician: Wendee Lynwood HERO, NP Location: [facility] Note by: Nurse Marcelino, MD (TTS technology used. I apologize for any typographical errors that were not detected and corrected.) Date: 10/02/2023; Time: 10:49 AM  Disclaimer:  Medicine is not an Visual merchandiser. The only guarantee in medicine is that nothing is guaranteed. It is important to note that the decision to proceed with this intervention was based on the information collected from the patient. The Data and conclusions were drawn from the patient's questionnaire, the interview, and the physical examination. Because the information was provided in large part by the patient, it cannot be guaranteed that it has not been purposely or unconsciously manipulated. Every effort has been made to obtain as much relevant data as possible for this evaluation. It is important to note that the conclusions that lead to this procedure are derived in large part from the available data. Always take into account that the treatment will also be dependent on availability of resources and existing treatment guidelines, considered by other Pain Management Practitioners as being common knowledge and practice, at the time of the intervention. For Medico-Legal purposes, it is also important to point out that variation  in procedural techniques and pharmacological choices are the acceptable norm. The indications, contraindications, technique, and results of the above procedure should only be interpreted and judged by a Board-Certified Interventional Pain Specialist with extensive familiarity and expertise in the same exact procedure and technique.

## 2023-10-03 ENCOUNTER — Telehealth: Payer: Self-pay | Admitting: *Deleted

## 2023-10-03 NOTE — Telephone Encounter (Signed)
 Attempted to call for post procedure follow-up. Message left.

## 2023-10-05 ENCOUNTER — Encounter: Payer: Self-pay | Admitting: Nurse Practitioner

## 2023-10-05 ENCOUNTER — Ambulatory Visit (INDEPENDENT_AMBULATORY_CARE_PROVIDER_SITE_OTHER): Admitting: Nurse Practitioner

## 2023-10-05 VITALS — BP 112/78 | HR 58 | Temp 97.4°F | Ht 66.0 in | Wt 179.0 lb

## 2023-10-05 DIAGNOSIS — L989 Disorder of the skin and subcutaneous tissue, unspecified: Secondary | ICD-10-CM | POA: Insufficient documentation

## 2023-10-05 DIAGNOSIS — Z8249 Family history of ischemic heart disease and other diseases of the circulatory system: Secondary | ICD-10-CM | POA: Insufficient documentation

## 2023-10-05 DIAGNOSIS — E7800 Pure hypercholesterolemia, unspecified: Secondary | ICD-10-CM | POA: Insufficient documentation

## 2023-10-05 DIAGNOSIS — I459 Conduction disorder, unspecified: Secondary | ICD-10-CM | POA: Diagnosis not present

## 2023-10-05 MED ORDER — MUPIROCIN 2 % EX OINT
1.0000 | TOPICAL_OINTMENT | Freq: Two times a day (BID) | CUTANEOUS | 0 refills | Status: AC
Start: 2023-10-05 — End: ?

## 2023-10-05 NOTE — Assessment & Plan Note (Signed)
 LDL in the 160s with family history of cardiac disease.  Will do CT and calcium score.  Patient given information to call and set up scan

## 2023-10-05 NOTE — Assessment & Plan Note (Signed)
 Continue present ointment twice daily for 7 to 10 days.  Patient to keep bellybutton dry and clean signs and symptoms reviewed when to return to clinic

## 2023-10-05 NOTE — Patient Instructions (Addendum)
 Nice to see you today Try to keep the belly button clean and dry Use the ointment I prescribed If it does not improve in the next 10 days let me know   Call and schedule the CT scan at   [facility] Address: [address], [place], [place] [address] Phone: [phone]

## 2023-10-05 NOTE — Progress Notes (Signed)
 Established Patient Office Visit  Subjective   Patient ID: Bobby Mcbride, male    DOB: 09-09-1970  Age: 53 y.o. 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 Pt would like to discuss the lab and EKG results.     HPI  Lab review: I last saw the patient 08/05/2021. 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 He is here to discuss those results   HLD: he is doing 3 meals a day and snacks. 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  Not currently on any cholesterol medication.  Does have a family history of heart disease and heart attack on his father side.  Patient denies any chest pain, shortness of breath, DOE.  They recommended either cardiac referral with stress test or CT calcium score.  Rash: Patient has rash in his umbilicus for several months.  He has been using over-the-counter diaper rash cream that proves beneficial.  States that it will scab over and he will scratch it and then it will not heal.  No discharge or drainage per patient report does not itch per patient report    Review of Systems  Constitutional:  Negative for chills and fever.  Respiratory:  Negative for shortness of breath.   Cardiovascular:  Negative for chest pain.  Skin:  Positive for rash.  Neurological:  Negative for headaches.  Psychiatric/Behavioral:  Negative for hallucinations and suicidal ideas.       Objective:     BP 112/78   Pulse (!) 58   Temp (!) 97.4 F (36.3 C) (Oral)   Ht 5' 6 (1.676 m)   Wt 179 lb (81.2 kg)   SpO2 94%   BMI 28.89 kg/m  BP Readings from Last 3 Encounters:  10/05/23 112/78  10/02/23 118/81  09/14/23 123/77   Wt Readings from Last 3 Encounters:  10/05/23 179 lb (81.2 kg)  10/02/23 175 lb (79.4 kg)  09/14/23 175 lb (79.4 kg)   SpO2 Readings from Last 3 Encounters:   10/05/23 94%  10/02/23 97%  09/14/23 100%      Physical Exam Vitals and nursing note reviewed.  Constitutional:      Appearance: Normal appearance.  Cardiovascular:     Rate and Rhythm: Normal rate and regular rhythm.     Heart sounds: Normal heart sounds.  Pulmonary:     Effort: Pulmonary effort is normal.     Breath sounds: Normal breath sounds.  Skin:    Findings: Rash present.      Neurological:     Mental Status: He is alert.      No results found for any visits on 10/05/23.    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  Patient asymptomatic        Musculoskeletal and Integument   Skin lesion   Continue present ointment twice daily for 7 to 10 days.  Patient to keep bellybutton dry and clean signs and symptoms reviewed when to return to clinic  Relevant Medications   mupirocin  ointment (BACTROBAN ) 2 %     Other   Elevated LDL cholesterol level   LDL in the 160s with family history of cardiac disease.  Will do CT and calcium score.  Patient given information to call and set up scan      Relevant Orders   CT CARDIAC SCORING (SELF PAY ONLY)   Family history of heart disease   Relevant Orders   CT CARDIAC SCORING (SELF PAY ONLY)    Return if symptoms worsen or fail to improve.    Adina Crandall, NP

## 2023-10-05 NOTE — Assessment & Plan Note (Signed)
 Most recent EKG showed a bundle branch block.  Patient asymptomatic

## 2023-10-13 ENCOUNTER — Ambulatory Visit: Payer: Self-pay

## 2023-10-13 DIAGNOSIS — L989 Disorder of the skin and subcutaneous tissue, unspecified: Secondary | ICD-10-CM

## 2023-10-13 MED ORDER — HYDROCORTISONE 2.5 % EX CREA: TOPICAL_CREAM | Freq: Two times a day (BID) | CUTANEOUS | 0 refills | Status: AC

## 2023-10-13 NOTE — Addendum Note (Signed)
 Addended by: WENDEE LYNWOOD HERO on: 10/13/2023 12:44 PM   Modules accepted: Orders

## 2023-10-13 NOTE — Telephone Encounter (Signed)
 Contacted pt back.   Relayed information to pt.  Pt verbalized understanding, will update if symptoms improve or worsen.  Pt has no questions or concerns.

## 2023-10-13 NOTE — Telephone Encounter (Signed)
 Stop using the bactroban . I am going to send in some hydrocortisone  2.5% cream that he can use twice a day for a week

## 2023-10-13 NOTE — Telephone Encounter (Signed)
 FYI Only or Action Required?: Action required by provider: clinical question for provider.  Patient was last seen in primary care on 10/05/2023 by Wendee Lynwood HERO, NP.  Called Nurse Triage reporting Rash.  Symptoms began several days ago.  Interventions attempted: Prescription medications: Bactroban .  Symptoms are: unchanged. Seen 10/05/23 with rash to belly button, the ointment has really helped. It looks the same. Asking for advise from PCP.  Triage Disposition: See PCP When Office is Open (Within 3 Days)  Patient/caregiver understands and will follow disposition?: Yes   Copied from CRM #[id number]. Topic: Clinical - Red Word Triage >> Oct 13, 2023  8:12 AM Treva T wrote: [facility] that prompted transfer to Nurse Triage: Reason for Triage: Patient calling, states seen provider at previous visit and was given some cream for a rash in abdominal area, and was advised by provider to follow up on progress of the rash after using cream.  Patient reports the rash is still present, and not getting any better. It is still red, and he has a burning sensation in and around the area. States he also feels nauseated, but unsure if this is coming from the rash. Declines any itching, more so irritation, declines fever.  Patient states he is unsure what to do next, as the rash is not getting any better.   Patient requesting to speak with nurse at time of call for further advisement. Reason for Disposition  Localized rash present > 7 days  Answer Assessment - Initial Assessment Questions 1. APPEARANCE of RASH: What does the rash look like? (e.g., blisters, dry flaky skin, red spots, redness, sores)     Red, dry 2. LOCATION: Where is the rash located?      Belly button 3. NUMBER: How many spots are there?      1 4. SIZE: How big are the spots? (e.g., inches, cm; or compare to size of pinhead, tip of pen, eraser, pea)      Belly button 5. ONSET: When did the rash start?      2 weeks 6.  ITCHING: Does the rash itch? If Yes, ask: How bad is the itch?  (Scale 0-10; or none, mild, moderate, severe)     no 7. PAIN: Does the rash hurt? If Yes, ask: How bad is the pain?  (Scale 0-10; or none, mild, moderate, severe)     no 8. OTHER SYMPTOMS: Do you have any other symptoms? (e.g., fever)     Burning sensation 9. PREGNANCY: Is there any chance you are pregnant? When was your last menstrual period?     N/a  Protocols used: Rash or Redness - Localized-A-AH

## 2023-10-25 ENCOUNTER — Encounter: Payer: Self-pay | Admitting: Internal Medicine

## 2023-10-25 ENCOUNTER — Ambulatory Visit: Admitting: Internal Medicine

## 2023-10-25 VITALS — BP 132/80 | HR 79 | Ht 66.0 in | Wt 182.0 lb

## 2023-10-25 DIAGNOSIS — R1310 Dysphagia, unspecified: Secondary | ICD-10-CM | POA: Diagnosis not present

## 2023-10-25 DIAGNOSIS — K2100 Gastro-esophageal reflux disease with esophagitis, without bleeding: Secondary | ICD-10-CM

## 2023-10-25 DIAGNOSIS — K222 Esophageal obstruction: Secondary | ICD-10-CM | POA: Diagnosis not present

## 2023-10-25 DIAGNOSIS — K219 Gastro-esophageal reflux disease without esophagitis: Secondary | ICD-10-CM

## 2023-10-25 MED ORDER — PANTOPRAZOLE SODIUM 40 MG PO TBEC: 40.0000 mg | DELAYED_RELEASE_TABLET | Freq: Two times a day (BID) | ORAL | 3 refills | Status: AC

## 2023-10-25 NOTE — Progress Notes (Signed)
 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  Former patient of Dr. Teressa, who presents today regarding management of chronic GERD and dysphagia secondary to peptic stricture.  He is accompanied by his wife.   The patient has a history of GERD complicated by erosive esophagitis and peptic stricture with recurrent transient food impactions which have brought him to the emergency room.  His last such episode was February 01, 2023 when he underwent upper endoscopy with Dr. Avram.  In addition to esophagitis and peptic stricture esophageal biopsies were taken.  The stricture was balloon dilated to 20 mm.  Biopsies were most consistent with reflux esophagitis.   I saw the patient in the office May 04, 2023 at which time he told me that he has had ongoing problems with intermittent solid food dysphagia.  He describes himself as frustrated and depressed.  He wonders if he is going to have this problem the rest of his life.  He was previously prescribed pantoprazole  40 mg twice daily.  However, he was taking this once daily approximately 2-3 times per week.  Does have intermittent reflux symptoms.  Also, he is partially edentulous and cannot chew his food particularly well.  He last underwent upper endoscopy May 31, 2018.  He was found to have esophagitis and peptic stricture which was dilated to 20 mm.  He was placed on pantoprazole  40 mg twice daily and asked to follow-up in 6 weeks.  He follows up at this time.  He and his wife tell me that he has been compliant with twice daily pantoprazole  therapy.  He states that his swallowing is doing well until about a month ago.  He has noticed some recurrent intermittent solid food dysphagia.  Feels that he may need to be dilated again.  He is wondering why he has had to have this done relatively frequently.  GI review of systems is otherwise negative   Patient has undergone prior  colonoscopy in June 2023.  Examination was normal.  Follow-up in 10 years recommended.  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 07/2022 Echo: EF 60-65%, no rwma, nl RV size/fxn.  No significant valvular disease.   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 per pt dx age 31; b. 07/2022 Echo: No MVP/MR.   Perianal lesion    Pre-syncope    a. 07/2022 Zio x 2 (total 4 wks): 1.  Predominantly sinus rhythm with average heart rate of 64 (38-144).  Rare PACs and PVCs. 2.  Predominantly sinus rhythm at average of 58 (37-99).  Rare PACs and PVCs.    Past Surgical History:  Procedure Laterality Date   APPENDECTOMY  1990  BALLOON DILATION N/A 02/01/2023   Procedure: BALLOON DILATION;  Surgeon: Avram Lupita BRAVO, MD;  Location: [facility] END[facility]COPY;  Service: Gastroenterology;  Laterality: N/A;   BIOPSY  09/03/2018   Procedure: BIOPSY;  Surgeon: Charlanne Groom, MD;  Location: [facility] ENDOSCOPY;  Service: Endoscopy;;   BIOPSY  02/01/2023   Procedure: BIOPSY;  Surgeon: Avram Lupita BRAVO, MD;  Location: [facility] ENDOSCOPY;  Service: Gastroenterology;;   COLONOSCOPY WITH ESOPHAGOGASTRODUODENOSCOPY (EGD)  08/31/2021   by dr teressa   ELBOW SURGERY Right 1987   ESOPHAGOGASTRODUODENOSCOPY  08/18/2011   Procedure: ESOPHAGOGASTRODUODENOSCOPY (EGD);  Surgeon: Gordy CHRISTELLA Starch, MD;  Location: [facility];  Service: Gastroenterology;  Laterality: N/A;   ESOPHAGOGASTRODUODENOSCOPY  11/11/2011   Procedure: ESOPHAGOGASTRODUODENOSCOPY (EGD);  Surgeon: Lupita BRAVO Avram, MD;  Location: [facility];  Service: Endoscopy;  Laterality: N/A;   ESOPHAGOGASTRODUODENOSCOPY (EGD) WITH PROPOFOL  N/A 09/03/2018   Procedure: ESOPHAGOGASTRODUODENOSCOPY (EGD) WITH PROPOFOL ;  Surgeon: Charlanne Groom, MD;  Location: [facility] ENDOSCOPY;  Service: Endoscopy;  Laterality: N/A;   ESOPHAGOGASTRODUODENOSCOPY (EGD) WITH PROPOFOL  N/A 11/24/2021   Procedure: ESOPHAGOGASTRODUODENOSCOPY (EGD) WITH PROPOFOL ;  Surgeon: Abran Norleen SAILOR, MD;  Location: [facility];  Service: Gastroenterology;  Laterality: N/A;   ESOPHAGOGASTRODUODENOSCOPY (EGD) WITH PROPOFOL  N/A 02/01/2023   Procedure: ESOPHAGOGASTRODUODENOSCOPY (EGD) WITH PROPOFOL ;  Surgeon: Avram Lupita BRAVO, MD;  Location: [facility] ENDOSCOPY;  Service: Gastroenterology;  Laterality: N/A;   KNEE ARTHROSCOPY Right 02/26/2009   @[facility]  by dr c. blackman   MASS EXCISION N/A 10/15/2021   Procedure: EXCISION OF PERIANAL MASS;  Surgeon: Teresa Lonni CHRISTELLA, MD;  Location: [facility] [place];  Service: General;  Laterality: N/A;   MULTIPLE TOOTH EXTRACTIONS     several ---  w/ IV sedation  last extraction 05/ 2023   RECTAL EXAM UNDER ANESTHESIA N/A 10/15/2021   Procedure: ANORECTAL EXAM UNDER ANESTHESIA;  Surgeon: Teresa Lonni CHRISTELLA, MD;  Location: [facility];  Service: General;  Laterality: N/A;   SHOULDER ARTHROSCOPY W/ ROTATOR CUFF REPAIR Right 2012   SKIN BIOPSY N/A 10/15/2021   Procedure: BIOPSY OF GLUTEAL SKIN;  Surgeon: Teresa Lonni CHRISTELLA, MD;  Location: [facility];  Service: General;  Laterality: N/A;    Social History Bobby Mcbride  reports that he has never smoked. He has never used smokeless tobacco. He reports that he does not drink alcohol and does not use drugs.  family history includes Asthma in his father; Colon polyps in his father;  Diabetes in his mother; Heart attack in his father; Heart failure in his father; Hypertension in his father and mother; Liver disease in his mother; Migraines in his mother; Prostate cancer in his father; Stroke in his mother.  Allergies  Allergen Reactions   Codeine Other (See Comments)    Hallucinations   Food Nausea And Vomiting    Pepperoni-nausea/vomiting Lettuce-nausea/vomiting Oranges-nausea/vomiting Cheese-nausea/vomiting   Hydrocodone -Acetaminophen  Other (See Comments)    Hallucinations   Ibuprofen Other (See Comments)    ulcers in my stomach   Compazine  [Prochlorperazine  Edisylate] Anxiety       PHYSICAL EXAMINATION: Vital signs: BP 132/80   Pulse 79   Ht 5' 6 (1.676 m)   Wt 182 lb (82.6 kg)   BMI 29.38 kg/m   Constitutional: generally well-appearing, no acute distress Psychiatric: alert and oriented x3, cooperative Eyes: extraocular movements intact, anicteric, conjunctiva pink Mouth: oral pharynx moist, no lesions Neck: supple no lymphadenopathy Cardiovascular: heart regular rate and rhythm, no murmur Lungs: clear to auscultation bilaterally Abdomen: soft, nontender, nondistended, no obvious ascites, no  peritoneal signs, normal bowel sounds, no organomegaly Rectal: Omitted Extremities: no clubbing, cyanosis, or lower extremity edema bilaterally Skin: no lesions on visible extremities Neuro: No focal deficits.  Cranial nerves intact  ASSESSMENT:  1.  GERD complicated by esophagitis and peptic stricture status post balloon dilation to 20 mm March 2025 2.  Recurrent dysphagia secondary to recurrent peptic stricture 3.  Normal colonoscopy 2023   PLAN:  1.  Reflux precautions 2.  Refill pantoprazole  40 mg twice daily.  Medication risks reviewed 3.  Schedule upper endoscopy with esophageal dilation.The nature of the procedure, as well as the risks, benefits, and alternatives were carefully and thoroughly reviewed with the patient. Ample time for discussion  and questions allowed. The patient understood, was satisfied, and agreed to proceed. A total time of 30 minutes was spent preparing to see the patient, obtaining comprehensive history, performing medically appropriate physical exam, counseling and educating the patient and his wife regarding the above listed issues, ordering medication, ordering therapeutic endoscopic procedure, and documenting clinical information in the health record

## 2023-10-25 NOTE — Patient Instructions (Signed)
 We have sent the following medications to your pharmacy for you to pick up at your convenience:  Protonix   You have been scheduled for an endoscopy. Please follow written instructions given to you at your visit today.  If you use inhalers (even only as needed), please bring them with you on the day of your procedure.  If you take any of the following medications, they will need to be adjusted prior to your procedure:   DO NOT TAKE 7 DAYS PRIOR TO TEST- Trulicity (dulaglutide) Ozempic, Wegovy (semaglutide) Mounjaro (tirzepatide) Bydureon Bcise (exanatide extended release)  DO NOT TAKE 1 DAY PRIOR TO YOUR TEST Rybelsus (semaglutide) Adlyxin (lixisenatide) Victoza (liraglutide) Byetta (exanatide) ___________________________________________________________________________  _______________________________________________________  If your blood pressure at your visit was 140/90 or greater, please contact your primary care physician to follow up on this.  _______________________________________________________  If you are age 54 or older, your body mass index should be between 23-30. Your Body mass index is 29.38 kg/m. If this is out of the aforementioned range listed, please consider follow up with your Primary Care Provider.  If you are age 54 or younger, your body mass index should be between 19-25. Your Body mass index is 29.38 kg/m. If this is out of the aformentioned range listed, please consider follow up with your Primary Care Provider.   ________________________________________________________  The [facility] providers would like to encourage you to use MYCHART to communicate with providers for non-urgent requests or questions.  Due to long hold times on the telephone, sending your provider a message by [facility] may be a faster and more efficient way to get a response.  Please allow 48 business hours for a response.  Please remember that this is for non-urgent requests.   _______________________________________________________  [facility] is using a team-based approach to care.  Your team is made up of your doctor and two to three APPS. Our APPS (Nurse Practitioners and Physician Assistants) work with your physician to ensure care continuity for you. They are fully qualified to address your health concerns and develop a treatment plan. They communicate directly with your gastroenterologist to care for you. Seeing the Advanced Practice Practitioners on your physician's team can help you by facilitating care more promptly, often allowing for earlier appointments, access to diagnostic testing, procedures, and other specialty referrals.

## 2023-10-31 ENCOUNTER — Ambulatory Visit: Admitting: Student in an Organized Health Care Education/Training Program

## 2023-11-07 ENCOUNTER — Ambulatory Visit: Admitting: Internal Medicine

## 2023-11-07 ENCOUNTER — Encounter: Payer: Self-pay | Admitting: Internal Medicine

## 2023-11-07 VITALS — BP 112/70 | HR 61 | Temp 97.7°F | Resp 11 | Ht 66.0 in | Wt 182.0 lb

## 2023-11-07 DIAGNOSIS — K449 Diaphragmatic hernia without obstruction or gangrene: Secondary | ICD-10-CM

## 2023-11-07 DIAGNOSIS — K219 Gastro-esophageal reflux disease without esophagitis: Secondary | ICD-10-CM | POA: Diagnosis not present

## 2023-11-07 DIAGNOSIS — K222 Esophageal obstruction: Secondary | ICD-10-CM | POA: Diagnosis not present

## 2023-11-07 DIAGNOSIS — R1319 Other dysphagia: Secondary | ICD-10-CM

## 2023-11-07 MED ORDER — SODIUM CHLORIDE 0.9 % IV SOLN: 500.0000 mL | Freq: Once | INTRAVENOUS | Status: DC

## 2023-11-07 NOTE — Progress Notes (Signed)
 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  Former patient of Dr. Teressa, who presents today regarding management of chronic GERD and dysphagia secondary to peptic stricture.  He is accompanied by his wife.   The patient has a history of GERD complicated by erosive esophagitis and peptic stricture with recurrent transient food impactions which have brought him to the emergency room.  His last such episode was February 01, 2023 when he underwent upper endoscopy with Dr. Avram.  In addition to esophagitis and peptic stricture esophageal biopsies were taken.  The stricture was balloon dilated to 20 mm.  Biopsies were most consistent with reflux esophagitis.   I saw the patient in the office May 04, 2023 at which time he told me that he has had ongoing problems with intermittent solid food dysphagia.  He describes himself as frustrated and depressed.  He wonders if he is going to have this problem the rest of his life.  He was previously prescribed pantoprazole  40 mg twice daily.  However, he was taking this once daily approximately 2-3 times per week.  Does have intermittent reflux symptoms.  Also, he is partially edentulous and cannot chew his food particularly well.   He last underwent upper endoscopy May 31, 2023.  He was found to have esophagitis and peptic stricture which was dilated to 20 mm.  He was placed on pantoprazole  40 mg twice daily and asked to follow-up in 6 weeks.  He follows up at this time.   He and his wife tell me that he has been compliant with twice daily pantoprazole  therapy.  He states that his swallowing is doing well until about a month ago.  He has noticed some recurrent intermittent solid food dysphagia.  Feels that he may need to be dilated again.  He is wondering why he has had to have this done relatively frequently.  GI review of systems is otherwise negative    Patient has undergone prior colonoscopy in June 2023.  Examination was normal.  Follow-up in 10 years recommended.   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 07/2022 Echo: EF 60-65%, no rwma, nl RV size/fxn.  No significant valvular disease.   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 per pt dx age 84; b. 07/2022 Echo: No MVP/MR.   Perianal lesion     Pre-syncope      a. 07/2022 Zio x 2 (total 4 wks): 1.  Predominantly sinus rhythm with average heart rate of 64 (38-144).  Rare PACs and PVCs. 2.  Predominantly sinus rhythm at average of 58 (37-99).  Rare PACs and PVCs.               Past Surgical History:  Procedure Laterality Date   APPENDECTOMY   1990   BALLOON DILATION N/A 02/01/2023    Procedure: BALLOON DILATION;  Surgeon: Avram Lupita BRAVO, MD;  Location[facility]COPY;  Service: Gastroenterology;  Laterality: N/A;   BIOPSY   09/03/2018    Procedure: BIOPSY;  Surgeon: Charlanne Groom, MD;  Location: [facility] END[facility];  Service: Endoscopy;;   BIOPSY   02/01/2023    Procedure: BIOPSY;  Surgeon: Avram Lupita BRAVO, MD;  Location: [facility] ENDOSCOPY;  Service: Gastroenterology;;   COLONOSCOPY WITH ESOPHAGOGASTRODUODENOSCOPY (EGD)   08/31/2021    by dr teressa   ELBOW SURGERY Right 1987   ESOPHAGOGASTRODUODENOSCOPY   08/18/2011     Procedure: ESOPHAGOGASTRODUODENOSCOPY (EGD);  Surgeon: Gordy CHRISTELLA Starch, MD;  Location: [facility];  Service: Gastroenterology;  Laterality: N/A;   ESOPHAGOGASTRODUODENOSCOPY   11/11/2011    Procedure: ESOPHAGOGASTRODUODENOSCOPY (EGD);  Surgeon: Lupita BRAVO Avram, MD;  Location: [facility];  Service: Endoscopy;  Laterality: N/A;   ESOPHAGOGASTRODUODENOSCOPY (EGD) WITH PROPOFOL  N/A 09/03/2018    Procedure: ESOPHAGOGASTRODUODENOSCOPY (EGD) WITH PROPOFOL ;  Surgeon: Charlanne Groom, MD;  Location: [facility];  Service: Endoscopy;  Laterality: N/A;   ESOPHAGOGASTRODUODENOSCOPY (EGD) WITH PROPOFOL  N/A 11/24/2021    Procedure: ESOPHAGOGASTRODUODENOSCOPY (EGD) WITH PROPOFOL ;  Surgeon: Abran Norleen SAILOR, MD;  Location: [facility];  Service: Gastroenterology;  Laterality: N/A;   ESOPHAGOGASTRODUODENOSCOPY (EGD) WITH PROPOFOL  N/A 02/01/2023    Procedure: ESOPHAGOGASTRODUODENOSCOPY (EGD) WITH PROPOFOL ;  Surgeon: Avram Lupita BRAVO, MD;  Location: [facility] ENDOSCOPY;  Service: Gastroenterology;  Laterality: N/A;   KNEE ARTHROSCOPY Right 02/26/2009    @[facility]  by dr c. blackman   MASS EXCISION N/A 10/15/2021    Procedure: EXCISION OF PERIANAL MASS;  Surgeon: Teresa Lonni CHRISTELLA, MD;  Location: [facility];  Service: General;  Laterality: N/A;   MULTIPLE TOOTH EXTRACTIONS        several ---  w/ IV sedation  last extraction 05/ 2023   RECTAL EXAM UNDER ANESTHESIA N/A 10/15/2021    Procedure: ANORECTAL EXAM UNDER ANESTHESIA;  Surgeon: Teresa Lonni CHRISTELLA, MD;  Location: [facility];  Service: General;  Laterality: N/A;   SHOULDER ARTHROSCOPY W/ ROTATOR CUFF REPAIR Right 2012   SKIN BIOPSY N/A 10/15/2021    Procedure: BIOPSY OF GLUTEAL SKIN;  Surgeon: Teresa Lonni CHRISTELLA, MD;  Location: [facility];  Service: General;  Laterality: N/A;          Social History Bobby Mcbride  reports that he has never smoked. He has never used smokeless tobacco. He reports that he does not drink alcohol  and does not use drugs.   family history includes Asthma in his father; Colon polyps in his father; Diabetes in his mother; Heart attack in his father; Heart failure in his father; Hypertension in his father and mother; Liver disease in his mother; Migraines in his mother; Prostate cancer in his father; Stroke in his mother.   Allergies       Allergies  Allergen Reactions   Codeine Other (See Comments)      Hallucinations   Food Nausea And Vomiting      Pepperoni-nausea/vomiting Lettuce-nausea/vomiting Oranges-nausea/vomiting Cheese-nausea/vomiting   Hydrocodone -Acetaminophen  Other (See Comments)      Hallucinations   Ibuprofen Other (See Comments)      ulcers in my stomach  Compazine  [Prochlorperazine  Edisylate] Anxiety            PHYSICAL EXAMINATION: Vital signs: BP 132/80   Pulse 79   Ht 5' 6 (1.676 m)   Wt 182 lb (82.6 kg)   BMI 29.38 kg/m   Constitutional: generally well-appearing, no acute distress Psychiatric: alert and oriented x3, cooperative Eyes: extraocular movements intact, anicteric, conjunctiva pink Mouth: oral pharynx moist, no lesions Neck: supple no lymphadenopathy Cardiovascular: heart regular rate and rhythm, no murmur Lungs: clear to auscultation bilaterally Abdomen: soft, nontender, nondistended, no obvious ascites, no peritoneal signs, normal bowel sounds, no organomegaly Rectal: Omitted Extremities: no clubbing, cyanosis, or lower extremity edema bilaterally Skin: no lesions on visible extremities Neuro: No focal deficits.  Cranial nerves intact   ASSESSMENT:   1.  GERD complicated by esophagitis and peptic stricture status post balloon dilation to 20 mm March 2025 2.  Recurrent dysphagia secondary to recurrent peptic stricture 3.  Normal colonoscopy 2023     PLAN:   1.  Reflux precautions 2.  Refill pantoprazole  40 mg twice daily.  Medication risks reviewed 3.  Schedule upper endoscopy with esophageal dilation.The nature of the  procedure, as well as the risks, benefits, and alternatives were carefully and thoroughly reviewed with the patient. Ample time for discussion and questions allowed. The patient understood, was satisfied, and agreed to proceed.

## 2023-11-07 NOTE — Progress Notes (Signed)
 Pt's states no medical or surgical changes since previsit or office visit.

## 2023-11-07 NOTE — Patient Instructions (Signed)
 Post dilation diet. Continue present medications. Chew food well at all times. Continue on pantoprazole  40 mg twice daily every day. Office will follow up with Dr. Abran in 6 months. Contact the office in the interim for recurrent problems or questions.  Handouts provided GERD, hiatal hernia, and esophageal stricture.   YOU HAD AN ENDOSCOPIC PROCEDURE TODAY AT THE [facility]:   Refer to the procedure report that was given to you for any specific questions about what was found during the examination.  If the procedure report does not answer your questions, please call your gastroenterologist to clarify.  If you requested that your care partner not be given the details of your procedure findings, then the procedure report has been included in a sealed envelope for you to review at your convenience later.  YOU SHOULD EXPECT: Some feelings of bloating in the abdomen. Passage of more gas than usual.  Walking can help get rid of the air that was put into your GI tract during the procedure and reduce the bloating. If you had a lower endoscopy (such as a colonoscopy or flexible sigmoidoscopy) you may notice spotting of blood in your stool or on the toilet paper. If you underwent a bowel prep for your procedure, you may not have a normal bowel movement for a few days.  Please Note:  You might notice some irritation and congestion in your nose or some drainage.  This is from the oxygen used during your procedure.  There is no need for concern and it should clear up in a day or so.  SYMPTOMS TO REPORT IMMEDIATELY:  Following upper endoscopy (EGD)  Vomiting of blood or coffee ground material  New chest pain or pain under the shoulder blades  Painful or persistently difficult swallowing  New shortness of breath  Fever of 100F or higher  Black, tarry-looking stools  For urgent or emergent issues, a gastroenterologist can be reached at any hour by calling [phone]. Do not use MyChart  messaging for urgent concerns.    DIET:  We do recommend a small meal at first, but then you may proceed to your regular diet.  Drink plenty of fluids but you should avoid alcoholic beverages for 24 hours.  ACTIVITY:  You should plan to take it easy for the rest of today and you should NOT DRIVE or use heavy machinery until tomorrow (because of the sedation medicines used during the test).    FOLLOW UP: Our staff will call the number listed on your records the next business day following your procedure.  We will call around 7:15- 8:00 am to check on you and address any questions or concerns that you may have regarding the information given to you following your procedure. If we do not reach you, we will leave a message.     If any biopsies were taken you will be contacted by phone or by letter within the next 1-3 weeks.  Please call us  at [phone] if you have not heard about the biopsies in 3 weeks.    SIGNATURES/CONFIDENTIALITY: You and/or your care partner have signed paperwork which will be entered into your electronic medical record.  These signatures attest to the fact that that the information above on your After Visit Summary has been reviewed and is understood.  Full responsibility of the confidentiality of this discharge information lies with you and/or your care-partner.

## 2023-11-07 NOTE — Op Note (Signed)
 [facility] Patient Name: Bobby Mcbride Procedure Date: 11/07/2023 3:50 PM MRN: [id number] Endoscopist: Norleen SAILOR. Abran , MD, [id number] Age: 53 Referring MD:  Date of Birth: 03/11/1971 Gender: Male Account #: [id number] Procedure:                Upper GI endoscopy with balloon dilation of the                            esophagus. 20 mm max Indications:              Dysphagia, , Therapeutic procedure, Esophageal                            reflux. Recurrent. History of erosive esophagitis,                            food impaction, and peptic stricture Medicines:                Monitored Anesthesia Care Procedure:                Pre-Anesthesia Assessment:                           - Prior to the procedure, a History and Physical                            was performed, and patient medications and                            allergies were reviewed. The patient's tolerance of                            previous anesthesia was also reviewed. The risks                            and benefits of the procedure and the sedation                            options and risks were discussed with the patient.                            All questions were answered, and informed consent                            was obtained. Prior Anticoagulants: The patient has                            taken no anticoagulant or antiplatelet agents. ASA                            Grade Assessment: II - A patient with mild systemic                            disease. After reviewing the risks and benefits,  the patient was deemed in satisfactory condition to                            undergo the procedure.                           After obtaining informed consent, the endoscope was                            passed under direct vision. Throughout the                            procedure, the patient's blood pressure, pulse, and                            oxygen saturations were  monitored continuously. The                            GIF [id number] #[id number] was introduced through the                            mouth, and advanced to the second part of duodenum.                            The upper GI endoscopy was accomplished without                            difficulty. The patient tolerated the procedure                            well. Scope In: Scope Out: Findings:                 One benign-appearing, ringlike intrinsic moderate                            stenosis was found 38 cm from the incisors. This                            stenosis measured 1.5 cm (inner diameter). A TTS                            dilator was passed through the scope. Dilation with                            an 18-19-20 mm balloon dilator was performed to 20                            mm. No ring disruption.                           The exam of the esophagus was otherwise normal.                            Previous esophagitis was  healed                           The stomach was normal. Small hiatal hernia.                           The examined duodenum was normal.                           The cardia and gastric fundus were normal on                            retroflexion. Complications:            No immediate complications. Estimated Blood Loss:     Estimated blood loss: none. Impression:               1. GERD                           2. History of erosive esophagitis. Healed on twice                            daily PPI                           3. Benign distal esophageal stricture status post                            dilation to 20 mm                           4. Small hiatal hernia. Otherwise normal EGD. Recommendation:           - Patient has a contact number available for                            emergencies. The signs and symptoms of potential                            delayed complications were discussed with the                            patient. Return to normal  activities tomorrow.                            Written discharge instructions were provided to the                            patient.                           - Post dilation diet.                           - Continue present medications.                           [place]  food well at all times                           - Continue on pantoprazole  40 mg TWICE DAILY every                            day                           - Office follow-up with Dr. Abran in 6 months.                            Contact the office in the interim for recurrent                            problems or questions Bannie Lobban N. Abran, MD 11/07/2023 4:20:26 PM This report has been signed electronically.

## 2023-11-07 NOTE — Progress Notes (Signed)
 Report to PACU, RN, vss, BBS= Clear.

## 2023-11-08 ENCOUNTER — Telehealth: Payer: Self-pay | Admitting: *Deleted

## 2023-11-08 NOTE — Telephone Encounter (Signed)
  Follow up Call-     11/07/2023    2:16 PM 05/31/2023    1:14 PM 05/20/2022   10:00 AM 09/01/2021    2:44 PM 05/11/2021    9:03 AM  Call back number  Post procedure Call Back phone  # [phone]  Permission to leave phone message Yes Yes Yes Yes Yes   No answer.  Unable to leave message- voicemail not set up

## 2023-11-09 ENCOUNTER — Encounter: Payer: Self-pay | Admitting: Student in an Organized Health Care Education/Training Program

## 2023-11-09 ENCOUNTER — Other Ambulatory Visit: Payer: Self-pay | Admitting: Student in an Organized Health Care Education/Training Program

## 2023-11-09 ENCOUNTER — Ambulatory Visit
Attending: Student in an Organized Health Care Education/Training Program | Admitting: Student in an Organized Health Care Education/Training Program

## 2023-11-09 VITALS — BP 127/81 | HR 100 | Temp 98.1°F | Resp 16 | Ht 67.0 in | Wt 180.0 lb

## 2023-11-09 DIAGNOSIS — M5136 Other intervertebral disc degeneration, lumbar region with discogenic back pain only: Secondary | ICD-10-CM | POA: Insufficient documentation

## 2023-11-09 DIAGNOSIS — M47816 Spondylosis without myelopathy or radiculopathy, lumbar region: Secondary | ICD-10-CM | POA: Diagnosis not present

## 2023-11-09 DIAGNOSIS — G894 Chronic pain syndrome: Secondary | ICD-10-CM | POA: Insufficient documentation

## 2023-11-09 MED ORDER — HYDROCODONE-ACETAMINOPHEN 5-325 MG PO TABS: 1.0000 | ORAL_TABLET | Freq: Every evening | ORAL | 0 refills | Status: AC | PRN

## 2023-11-09 MED ORDER — DICLOFENAC SODIUM 75 MG PO TBEC: 75.0000 mg | DELAYED_RELEASE_TABLET | Freq: Two times a day (BID) | ORAL | 1 refills | Status: AC

## 2023-11-09 NOTE — Patient Instructions (Signed)

## 2023-11-09 NOTE — Progress Notes (Signed)
 PROVIDER NOTE: Interpretation of information contained herein should be left to medically-trained personnel. Specific patient instructions are provided elsewhere under Patient Instructions section of medical record. This document was created in part using AI and STT-dictation technology, any transcriptional errors that may result from this process are unintentional.  Patient: Bobby Mcbride  Service: E/M   PCP: Mcbride Lynwood HERO, NP  DOB: [birth year]  DOS: 11/09/2023  Provider: Wallie Sherry, MD  MRN: [id number]  Delivery: Face-to-face  Specialty: Interventional Pain Management  Type: Established Patient  Setting: Ambulatory outpatient facility  Specialty designation: 09  Referring Prov.: Mcbride Lynwood HERO, NP  Location: Outpatient office facility       History of present illness (HPI) Bobby Mcbride, a 53 y.o. year old male, is here today because of his Lumbar facet arthropathy [M47.816]. Mr. Chasen primary complain today is Back Pain (lower)  Pertinent problems: Mr. Eisner does not have any pertinent problems on file.  Pain Assessment: Severity of Chronic pain is reported as a 10-Worst pain ever/10. Location: Back Lower/denies. Onset: More than a month ago. Quality: Constant, Sharp, Throbbing. Timing: Constant. Modifying factor(s): sitting, heat, massage. Vitals:  height is 5' 7 (1.702 m) and weight is 180 lb (81.6 kg). His temporal temperature is 98.1 F (36.7 C). His blood pressure is 127/81 and his pulse is 100. His respiration is 16 and oxygen saturation is 99%.  BMI: Estimated body mass index is 28.19 kg/m as calculated from the following:   Height as of this encounter: 5' 7 (1.702 m).   Weight as of this encounter: 180 lb (81.6 kg).  Last encounter: 09/14/2023. Last procedure: 10/02/2023.  Reason for encounter:   Post-Procedure Evaluation   Type: Lumbar Facet, Medial Branch Block(s) (w/ fluoroscopic mapping) #1  Laterality: Bilateral  Level: L3, L4, and L5 Medial Branch/Dorsal Rami  Level(s). Injecting these levels blocks the L3-4 and L4-5 lumbar facet joints. Imaging: Fluoroscopic guidance Spinal ([id number]) Anesthesia: Local anesthesia (1-2% Lidocaine ) Sedation: Minimal Sedation                       DOS: 10/02/2023 Performed by: Wallie Sherry, MD  Primary Purpose: Diagnostic/Therapeutic Indications: Low back pain severe enough to impact quality of life or function. 1. Lumbar facet arthropathy   2. Lumbar spondylosis   3. Degeneration of intervertebral disc of lumbar region with discogenic back pain    NAS-11 Pain score:   Pre-procedure: 8 /10   Post-procedure: 7 /10     Effectiveness:  Initial hour after procedure: 100% for 2 days Subsequent 4-6 hours post-procedure: 100% for 2 days Analgesia past initial 6 hours: 100% for 2 days Ongoing improvement:  Analgesic: Back to baseline    History of Present Illness   Bobby Mcbride is a 53 year old male with arthritis who presents with severe back pain.  He experiences severe, persistent, and debilitating back pain that significantly impacts his daily life and work. The pain is localized to his back and does not radiate to his legs. He describes it as deep and notes that it worsens after a day of relief from a previous nerve block injection.  His medical history includes arthritis in the back, and he reports that his MRI showed some arthritic changes. Previous treatments have included a nerve block, which provided only approximately 2 days of relief, and Celebrex , which he reports as ineffective. He cannot take long-term NSAIDs due to a recent endoscopy and potential ulcer risk, and he avoids  opioids while working due to the nature of his job.  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 He has tried ibuprofen and muscle relaxants without relief. He cannot take codeine or tramadol  due to ineffectiveness and work constraints. He  has previously used a back brace without benefit.  The pain significantly impacts his daily life, making it difficult to perform tasks at work and at home. He is concerned about maintaining his ability to work and is seeking effective pain management options.       Pharmacotherapy Assessment   Start hydrocodone  5 mg to 10 mg nightly as needed Monitoring: [place] PMP: PDMP not reviewed this encounter.       Pharmacotherapy: No side-effects or adverse reactions reported. Compliance: No problems identified. Effectiveness: Clinically acceptable.  No notes on file  UDS:  No results found for: SUMMARY  No results found for: CBDTHCR No results found for: D8THCCBX No results found for: D9THCCBX  ROS  Constitutional: Denies any fever or chills Gastrointestinal: No reported hemesis, hematochezia, vomiting, or acute GI distress Musculoskeletal: Positive low back pain Neurological: No reported episodes of acute onset apraxia, aphasia, dysarthria, agnosia, amnesia, paralysis, loss of coordination, or loss of consciousness  Medication Review  HYDROcodone -acetaminophen , diclofenac , hydrocortisone , mupirocin  ointment, and pantoprazole   History Review  Allergy: Mr. Woolever is allergic to codeine, food, hydrocodone -acetaminophen , ibuprofen, and compazine  [prochlorperazine  edisylate]. Drug: Mr. Urton  reports no history of drug use. Alcohol:  reports no history of alcohol use. Tobacco:  reports that he has never smoked. He has never used smokeless tobacco. Social: Mr. Greenman  reports that he has never smoked. He has never used smokeless tobacco. He reports that he does not drink alcohol and does not use drugs. 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 Surgical: Mr. Parlee  has a past surgical history that includes Elbow surgery (Right, 1987); Appendectomy (1990); Knee arthroscopy (Right, 02/26/2009); Shoulder arthroscopy w/ rotator cuff repair (Right, 2012); Esophagogastroduodenoscopy (08/18/2011); Esophagogastroduodenoscopy (11/11/2011); Esophagogastroduodenoscopy (egd) with propofol  (N/A, 09/03/2018); biopsy (09/03/2018); Colonoscopy with esophagogastroduodenoscopy (egd) (08/31/2021); Multiple tooth extractions; Mass excision (N/A, 10/15/2021); Skin biopsy (N/A, 10/15/2021); Rectal exam under anesthesia (N/A, 10/15/2021); Esophagogastroduodenoscopy (egd) with propofol  (N/A, 11/24/2021); Esophagogastroduodenoscopy (egd) with propofol  (N/A, 02/01/2023); biopsy (02/01/2023); and Balloon dilation (N/A, 02/01/2023). Family: family history includes Asthma in his father; Colon polyps in his father; Diabetes in his mother; Heart attack in his father; Heart failure in his father; Hypertension in his father and mother; Liver disease in his mother; Migraines in his mother; Prostate cancer in his father; Stroke in his mother.  Laboratory Chemistry Profile   Renal Lab Results  Component Value Date   BUN 12 08/02/2023   CREATININE 1.2 08/02/2023   GFR 75.02 08/03/2021   GFRAA >60 09/03/2018   GFRNONAA >60 06/11/2023    Hepatic Lab Results  Component Value Date   AST 33 08/02/2023   ALT 45 (A) 08/02/2023   ALBUMIN 4.8 08/02/2023   ALKPHOS 86 08/02/2023   LIPASE 10 (L) 04/30/2023    Electrolytes Lab Results  Component Value Date   NA 138 08/02/2023   K 4.0 08/02/2023   CL 98 (A) 08/02/2023   CALCIUM  9.2 08/02/2023    Bone No results found for: VD25OH, [id number], [id number], [id number], 25OHVITD1, 25OHVITD2, 25OHVITD3, TESTOFREE, TESTOSTERONE  Inflammation (CRP: Acute Phase) (ESR: Chronic Phase) Lab Results  Component  Value Date   CRP <1.0 08/03/2021   ESRSEDRATE 5 08/03/2021   LATICACIDVEN 0.8 08/17/2011         Note: Above Lab results reviewed.  Recent Imaging Review  MR Lumbar Spine w/o contrast   Narrative CLINICAL DATA:  Low back pain, symptoms persist with > 6 wks treatment   EXAM: MRI LUMBAR SPINE WITHOUT CONTRAST   TECHNIQUE: Multiplanar, multisequence MR imaging of the lumbar spine was performed. No intravenous contrast was administered.   COMPARISON:  MR L Spine 05/28/19   FINDINGS: Segmentation: In keeping with prior numbering convention, there is lumbarization of S1 with the last well-formed disc space labeled S1-S2   Alignment:  Trace retrolisthesis of L4 on L5.   Vertebrae: No fracture, evidence of discitis, or bone lesion. Redemonstrated hypoplastic left sided posterior elements at S1, unchanged   Conus medullaris and cauda equina: Conus extends to the L2 level. Conus and cauda equina appear normal.   Paraspinal and other soft tissues: Negative.   Disc levels:   T12-L1: Only imaged in the sagittal plane. No evidence of high-grade spinal canal or neural foraminal stenosis.   L1-L2: Mild bilateral facet degenerative change. No significant disc bulge. No spinal canal narrowing. No neural foraminal narrowing.   L2-L3: Mild bilateral facet degenerative change. No significant disc bulge. No spinal canal narrowing. Mild left neural foraminal narrowing.   L3-L4: Mild bilateral facet degenerative change. No significant disc bulge. No spinal canal narrowing. No neural foraminal narrowing.   L4-L5: Mild bilateral facet degenerative change. No significant disc bulge. No spinal canal narrowing. No significant neural foraminal narrowing.   L5-S1: Moderate right facet degenerative change. Mild spinal canal narrowing. No neural foraminal narrowing.   IMPRESSION: Transitional anatomy with lumbarization of S1 and partial absence of the left S1 pedicle and posterior  elements.   1. No acute osseous abnormality. 2. No significant change from prior exam. No evidence of high grade spinal canal or neuroforaminal narrowing.     Electronically Signed By: Lyndall Gore M.D. On: 12/21/2022 07:46 Note: Reviewed        Physical Exam  Vitals: BP 127/81 (Cuff Size: Normal)   Pulse 100   Temp 98.1 F (36.7 C) (Temporal)   Resp 16   Ht 5' 7 (1.702 m)   Wt 180 lb (81.6 kg)   SpO2 99%   BMI 28.19 kg/m  BMI: Estimated body mass index is 28.19 kg/m as calculated from the following:   Height as of this encounter: 5' 7 (1.702 m).   Weight as of this encounter: 180 lb (81.6 kg). Ideal: Ideal body weight: 66.1 kg (145 lb 11.6 oz) Adjusted ideal body weight: 72.3 kg (159 lb 6.9 oz) General appearance: Well nourished, well developed, and well hydrated. In no apparent acute distress Mental status: Alert, oriented x 3 (person, place, & time)       Respiratory: No evidence of acute respiratory distress Eyes: PERLA Lumbar Spine Area Exam  Skin & Axial Inspection: No masses, redness, or swelling Alignment: Symmetrical Functional ROM: Pain restricted ROM       Stability: No instability detected Muscle Tone/Strength: Functionally intact. No obvious neuro-muscular anomalies detected. Sensory (Neurological): Musculoskeletal pain pattern Palpation: Complains of area being tender to palpation       Provocative Tests: Hyperextension/rotation test: (+) bilaterally for  facet joint pain. Lumbar quadrant test (Kemp's test): (+) bilaterally for facet joint pain.   Gait & Posture Assessment  Ambulation: Unassisted Gait: Relatively normal for age and body habitus Posture: WNL  Lower Extremity Exam      Side: Right lower extremity   Side: Left lower extremity  Stability: No instability observed           Stability: No instability observed          Skin & Extremity Inspection: Skin color, temperature, and hair growth are WNL. No peripheral edema or cyanosis. No masses,  redness, swelling, asymmetry, or associated skin lesions. No contractures.   Skin & Extremity Inspection: Skin color, temperature, and hair growth are WNL. No peripheral edema or cyanosis. No masses, redness, swelling, asymmetry, or associated skin lesions. No contractures.  Functional ROM: Unrestricted ROM                   Functional ROM: Unrestricted ROM                  Muscle Tone/Strength: Functionally intact. No obvious neuro-muscular anomalies detected.   Muscle Tone/Strength: Functionally intact. No obvious neuro-muscular anomalies detected.  Sensory (Neurological): Unimpaired         Sensory (Neurological): Unimpaired        DTR: Patellar: deferred today Achilles: deferred today Plantar: deferred today   DTR: Patellar: deferred today Achilles: deferred today Plantar: deferred today  Palpation: No palpable anomalies   Palpation: No palpable anomalies       Assessment   Diagnosis  1. Lumbar facet arthropathy   2. Lumbar spondylosis   3. Degeneration of intervertebral disc of lumbar region with discogenic back pain   4. Chronic pain syndrome      Updated Problems: No problems updated.  Plan of Care  Problem-specific:  Assessment and Plan    Lumbar spondylosis with chronic low back pain   Chronic low back pain from lumbar spondylosis is severe and persistent,   The patient is status post positive diagnostic lumbar medial branch nerve block (Block #1) at bilateral L3, L4, and L5 levels, with significant temporary pain relief, consistent with facet-mediated low back pain.  We will proceed with a second diagnostic medial branch block at the same levels to confirm the diagnosis, as per standard protocol. If the patient experiences similar positive response with Block #2, we will then consider proceeding with lumbar medial branch radiofrequency ablation (RFA) for longer-term pain relief.  The patient was counseled on the rationale, goals, and expected outcomes of repeat  diagnostic blocks and RFA, and agrees with the plan. Follow-up will be scheduled accordingly.   Unfortunately, trials of Celebrex  and ibuprofen were ineffective. He cannot use opioids or NSAIDs during work due to safety concerns. Prescribe diclofenac  as an alternative NSAID. Prescribe low dose hydrocodone  for severe pain at home, as needed, with a urine drug screen before prescribing. Recommend Colace with hydrocodone  to prevent constipation. Schedule a second lumbar facet medial branch nerve block for Monday, September 22nd, and discuss potential for nerve ablation if response is adequate. Consider a back brace for additional support and stabilization.       Mr. Gaspare E Vittitow has a current medication list which includes the following long-term medication(s): pantoprazole .  Pharmacotherapy (Medications Ordered): Meds ordered this encounter  Medications   diclofenac  (VOLTAREN ) 75 MG EC tablet    Sig: Take 1 tablet (75 mg total) by mouth 2 (two) times daily.    Dispense:  30  tablet    Refill:  1   HYDROcodone -acetaminophen  (NORCO/VICODIN) 5-325 MG tablet    Sig: Take 1-2 tablets by mouth at bedtime as needed for severe pain (pain score 7-10). Must last 30 days    Dispense:  60 tablet    Refill:  0    Chronic Pain: STOP Act (Not applicable) Fill 1 day early if closed on refill date. Avoid benzodiazepines within 8 hours of opioids   Orders:  Orders Placed This Encounter  Procedures   LUMBAR FACET(MEDIAL BRANCH NERVE BLOCK) MBNB    Diagnosis: Lumbar Facet Syndrome (M47.816); Lumbosacral Facet Syndrome (M47.817); Lumbar Facet Joint Pain (M54.59) Medical Necessity Statement: 1.Severe chronic axial low back pain causing functional impairment documented by ongoing pain scale assessments. 2.Pain present for longer than 3 months (Chronic) documented to have failed noninvasive conservative therapies. 3.Absence of untreated radiculopathy. 4.There is no radiological evidence of untreated  fractures, tumor, infection, or deformity.  Physical Examination Findings: Positive Kemp Maneuver: (Y)  Positive Lumbar Hyperextension-Rotation provocative test: (Y)    Standing Status:   Future    Expiration Date:   02/09/2024    Scheduling Instructions:     Procedure: Lumbar facet Block #2     Type: Medial Branch Block     Side: Bilateral     Purpose: Diagnostic Radiologic Mapping     Level(s): L3-4, L4-5,  by Fluoroscopic Mapping Facets (L3, L4, L5, Medial Branch)     Sedation: IV Versed      Timeframe: As soon as schedule allows.    Where will this procedure be performed?:   [facility]   Compliance Drug Analysis, Ur    Volume: 30 ml(s). Minimum 3 ml of urine is needed. Document temperature of fresh sample. Indications: Long term (current) use of opiate analgesic (Z79.891) Test#: [phone] (Comprehensive Profile)    Release to patient:   Immediate     B/L L3-5 MBNB 10/02/23   Return in about 25 days (around 12/04/2023) for B/L L3, 4, 5 MBNB 2, in clinic IV Versed .    Recent Visits Date Type Provider Dept  10/02/23 Procedure visit Marcelino Nurse, MD [facility]  09/14/23 Office Visit Marcelino Nurse, MD [facility]  Showing recent visits within past 90 days and meeting all other requirements Today's Visits Date Type Provider Dept  11/09/23 Office Visit Marcelino Nurse, MD [facility]  Showing today's visits and meeting all other requirements Future Appointments No visits were found meeting these conditions. Showing future appointments within next 90 days and meeting all other requirements  I discussed the assessment and treatment plan with the patient. The patient was provided an opportunity to ask questions and all were answered. The patient agreed with the plan and demonstrated an understanding of the instructions.  Patient advised to call back or seek an in-person evaluation if the symptoms or condition worsens.  Duration of encounter:  .  Total time on encounter, as per AMA guidelines included both the face-to-face and non-face-to-face time personally spent by the physician and/or other qualified health care professional(s) on the day of the encounter (includes time in activities that require the physician or other qualified health care professional and does not include time in activities normally performed by clinical staff). Physician's time may include the following activities when performed: Preparing to see the patient (e.g., pre-charting review of records, searching for previously ordered imaging, lab work, and nerve conduction tests) Review of prior analgesic pharmacotherapies. Reviewing PMP Interpreting ordered tests (e.g., lab work, imaging,  nerve conduction tests) Performing post-procedure evaluations, including interpretation of diagnostic procedures Obtaining and/or reviewing separately obtained history Performing a medically appropriate examination and/or evaluation Counseling and educating the patient/family/caregiver Ordering medications, tests, or procedures Referring and communicating with other health care professionals (when not separately reported) Documenting clinical information in the electronic or other health record Independently interpreting results (not separately reported) and communicating results to the patient/ family/caregiver Care coordination (not separately reported)  Note by: Wallie Sherry, MD (TTS and AI technology used. I apologize for any typographical errors that were not detected and corrected.) Date: 11/09/2023; Time: 2:19 PM

## 2023-11-13 LAB — COMPLIANCE DRUG ANALYSIS, UR

## 2023-11-15 ENCOUNTER — Telehealth: Payer: Self-pay

## 2023-11-16 NOTE — Telephone Encounter (Signed)
 Opened in error

## 2023-11-20 ENCOUNTER — Telehealth: Payer: Self-pay

## 2023-11-20 DIAGNOSIS — R21 Rash and other nonspecific skin eruption: Secondary | ICD-10-CM

## 2023-11-20 NOTE — Telephone Encounter (Signed)
 Copied from CRM [id number]. Topic: Clinical - Medical Advice >> Nov 20, 2023  3:54 PM Drema MATSU wrote: Reason for CRM: Patient has a rash on stomach and has not gone away. Patient wife wants to know what else can patient do for the rash.

## 2023-11-21 NOTE — Telephone Encounter (Signed)
 Pt complains of on going rash.  States that the cream is not making any improvement. States he is applying topical cream 2-3 times a day. Redness has not gone away.  States that its getting aggravating and he is worried if the rash will spread to inside his body or cause worse infections.

## 2023-11-21 NOTE — Telephone Encounter (Signed)
 Stop using the cream. I will refer him to dermatology

## 2023-11-21 NOTE — Addendum Note (Signed)
 Addended by: WENDEE LYNWOOD HERO on: 11/21/2023 01:04 PM   Modules accepted: Orders

## 2023-11-22 NOTE — Telephone Encounter (Signed)
 Called pt and informed him of dermatology referral. Pt verbalized understanding and has no questions or concerns.

## 2023-12-04 ENCOUNTER — Ambulatory Visit
Admission: RE | Admit: 2023-12-04 | Discharge: 2023-12-04 | Disposition: A | Discharge status: Home / Self Care | Source: Ambulatory Visit | Attending: Student in an Organized Health Care Education/Training Program | Admitting: Student in an Organized Health Care Education/Training Program

## 2023-12-04 ENCOUNTER — Encounter: Payer: Self-pay | Admitting: Student in an Organized Health Care Education/Training Program

## 2023-12-04 ENCOUNTER — Other Ambulatory Visit: Payer: Self-pay | Admitting: Student in an Organized Health Care Education/Training Program

## 2023-12-04 ENCOUNTER — Ambulatory Visit (HOSPITAL_BASED_OUTPATIENT_CLINIC_OR_DEPARTMENT_OTHER): Admitting: Student in an Organized Health Care Education/Training Program

## 2023-12-04 DIAGNOSIS — M47816 Spondylosis without myelopathy or radiculopathy, lumbar region: Secondary | ICD-10-CM

## 2023-12-04 DIAGNOSIS — M5136 Other intervertebral disc degeneration, lumbar region with discogenic back pain only: Secondary | ICD-10-CM

## 2023-12-04 DIAGNOSIS — G894 Chronic pain syndrome: Secondary | ICD-10-CM

## 2023-12-04 MED ORDER — LACTATED RINGERS IV SOLN: Freq: Once | INTRAVENOUS | Status: AC

## 2023-12-04 MED ORDER — MIDAZOLAM HCL 2 MG/2ML IJ SOLN
INTRAMUSCULAR | Status: AC
  Filled 2023-12-04: qty 2

## 2023-12-04 MED ORDER — DEXAMETHASONE SODIUM PHOSPHATE 10 MG/ML IJ SOLN
INTRAMUSCULAR | Status: AC
  Filled 2023-12-04: qty 2

## 2023-12-04 MED ORDER — LIDOCAINE HCL 2 % IJ SOLN
INTRAMUSCULAR | Status: AC
Start: 2023-12-04 — End: 2023-12-04
  Filled 2023-12-04: qty 20

## 2023-12-04 MED ORDER — ROPIVACAINE HCL 2 MG/ML IJ SOLN
INTRAMUSCULAR | Status: AC
  Filled 2023-12-04: qty 20

## 2023-12-04 MED ORDER — DEXAMETHASONE SODIUM PHOSPHATE 10 MG/ML IJ SOLN
20.0000 mg | Freq: Once | INTRAMUSCULAR | Status: AC
  Administered 2023-12-04: 20 mg

## 2023-12-04 MED ORDER — ROPIVACAINE HCL 2 MG/ML IJ SOLN
18.0000 mL | Freq: Once | INTRAMUSCULAR | Status: AC
  Administered 2023-12-04: 18 mL via PERINEURAL

## 2023-12-04 MED ORDER — LIDOCAINE HCL 2 % IJ SOLN
20.0000 mL | Freq: Once | INTRAMUSCULAR | Status: AC
  Administered 2023-12-04: 400 mg

## 2023-12-04 MED ORDER — MIDAZOLAM HCL 2 MG/2ML IJ SOLN
0.5000 mg | Freq: Once | INTRAMUSCULAR | Status: AC
  Administered 2023-12-04: 2 mg via INTRAVENOUS

## 2023-12-04 NOTE — Progress Notes (Signed)
 Safety precautions to be maintained throughout the outpatient stay will include: orient to surroundings, keep bed in low position, maintain call bell within reach at all times, provide assistance with transfer out of bed and ambulation.

## 2023-12-04 NOTE — Progress Notes (Signed)
 PROVIDER NOTE: Interpretation of information contained herein should be left to medically-trained personnel. Specific patient instructions are provided elsewhere under Patient Instructions section of medical record. This document was created in part using STT-dictation technology, any transcriptional errors that may result from this process are unintentional.  Patient: Bobby Mcbride Type: Established DOB: Jun 13, 1970 MRN: [id number] PCP: Crandall Lynwood HERO, NP  Service: Procedure DOS: 12/04/2023 Setting: Ambulatory Location: Ambulatory outpatient facility Delivery: Face-to-face Provider: Wallie Sherry, MD Specialty: Interventional Pain Management Specialty designation: 09 Location: Outpatient facility Ref. Prov.: Sherry Wallie, MD       Interventional Therapy   Type: Lumbar Facet, Medial Branch Block(s) (w/ fluoroscopic mapping) #2  Laterality: Bilateral  Level: L3, L4, and L5 Medial Branch/Dorsal Rami Level(s). Injecting these levels blocks the L3-4 and L4-5 lumbar facet joints. Imaging: Fluoroscopic guidance Spinal ([id number]) Anesthesia: Local anesthesia (1-2% Lidocaine ) Sedation:   minimal         DOS: 12/04/2023 Performed by: Wallie Sherry, MD  Primary Purpose: Diagnostic/Therapeutic Indications: Low back pain severe enough to impact quality of life or function. 1. Lumbar facet arthropathy   2. Lumbar spondylosis   3. Degeneration of intervertebral disc of lumbar region with discogenic back pain    NAS-11 Pain score:   Pre-procedure: 8 /10   Post-procedure: 0-No pain/10     Position / Prep / Materials:  Position: Prone  Prep solution: ChloraPrep (2% chlorhexidine gluconate and 70% isopropyl alcohol) Area Prepped: Posterolateral Lumbosacral Spine (Wide prep: From the lower border of the scapula down to the end of the tailbone and from flank to flank.)  Materials:  Tray: Block Needle(s):  Type: Spinal  Gauge (G): 22  Length: 3.5-in Qty: 2     H&P (Pre-op Assessment):   Mr. Rosebrook is a 53 y.o. (year old), male patient, seen today for interventional treatment. He  has a past surgical history that includes Elbow surgery (Right, 1987); Appendectomy (1990); Knee arthroscopy (Right, 02/26/2009); Shoulder arthroscopy w/ rotator cuff repair (Right, 2012); Esophagogastroduodenoscopy (08/18/2011); Esophagogastroduodenoscopy (11/11/2011); Esophagogastroduodenoscopy (egd) with propofol  (N/A, 09/03/2018); biopsy (09/03/2018); Colonoscopy with esophagogastroduodenoscopy (egd) (08/31/2021); Multiple tooth extractions; Mass excision (N/A, 10/15/2021); Skin biopsy (N/A, 10/15/2021); Rectal exam under anesthesia (N/A, 10/15/2021); Esophagogastroduodenoscopy (egd) with propofol  (N/A, 11/24/2021); Esophagogastroduodenoscopy (egd) with propofol  (N/A, 02/01/2023); biopsy (02/01/2023); and Balloon dilation (N/A, 02/01/2023). Mr. Kimball has a current medication list which includes the following prescription(s): diclofenac , hydrocodone -acetaminophen , hydrocortisone , mupirocin  ointment, and pantoprazole , and the following Facility-Administered Medications: lactated ringers . His primarily concern today is the Back Pain (Bilateral lumbar )  Initial Vital Signs:  Pulse/HCG Rate: 72ECG Heart Rate: 69 Temp: (!) 97.4 F (36.3 C) Resp: 16 BP: 135/87 SpO2: 97 %  BMI: Estimated body mass index is 28.19 kg/m as calculated from the following:   Height as of this encounter: 5' 7 (1.702 m).   Weight as of this encounter: 180 lb (81.6 kg).  Risk Assessment: Allergies: Reviewed. He is allergic to codeine, food, hydrocodone -acetaminophen , ibuprofen, and compazine  [prochlorperazine  edisylate].  Allergy Precautions: None required Coagulopathies: Reviewed. None identified.  Blood-thinner therapy: None at this time Active Infection(s): Reviewed. None identified. Mr. Vandergrift is afebrile  Site Confirmation: Mr. Lohmeyer was asked to confirm the procedure and laterality before marking the site Procedure checklist:  Completed Consent: Before the procedure and under the influence of no sedative(s), amnesic(s), or anxiolytics, the patient was informed of the treatment options, risks and possible complications. To fulfill our ethical and legal obligations, as recommended by the American Medical Association's Code of Ethics, I have informed  the patient of my clinical impression; the nature and purpose of the treatment or procedure; the risks, benefits, and possible complications of the intervention; the alternatives, including doing nothing; the risk(s) and benefit(s) of the alternative treatment(s) or procedure(s); and the risk(s) and benefit(s) of doing nothing. The patient was provided information about the general risks and possible complications associated with the procedure. These may include, but are not limited to: failure to achieve desired goals, infection, bleeding, organ or nerve damage, allergic reactions, paralysis, and death. In addition, the patient was informed of those risks and complications associated to Spine-related procedures, such as failure to decrease pain; infection (i.e.: Meningitis, epidural or intraspinal abscess); bleeding (i.e.: epidural hematoma, subarachnoid hemorrhage, or any other type of intraspinal or peri-dural bleeding); organ or nerve damage (i.e.: Any type of peripheral nerve, nerve root, or spinal cord injury) with subsequent damage to sensory, motor, and/or autonomic systems, resulting in permanent pain, numbness, and/or weakness of one or several areas of the body; allergic reactions; (i.e.: anaphylactic reaction); and/or death. Furthermore, the patient was informed of those risks and complications associated with the medications. These include, but are not limited to: allergic reactions (i.e.: anaphylactic or anaphylactoid reaction(s)); adrenal axis suppression; blood sugar elevation that in diabetics may result in ketoacidosis or comma; water retention that in patients with history  of congestive heart failure may result in shortness of breath, pulmonary edema, and decompensation with resultant heart failure; weight gain; swelling or edema; medication-induced neural toxicity; particulate matter embolism and blood vessel occlusion with resultant organ, and/or nervous system infarction; and/or aseptic necrosis of one or more joints. Finally, the patient was informed that Medicine is not an exact science; therefore, there is also the possibility of unforeseen or unpredictable risks and/or possible complications that may result in a catastrophic outcome. The patient indicated having understood very clearly. We have given the patient no guarantees and we have made no promises. Enough time was given to the patient to ask questions, all of which were answered to the patient's satisfaction. Mr. Whitehorn has indicated that he wanted to continue with the procedure. Attestation: I, the ordering provider, attest that I have discussed with the patient the benefits, risks, side-effects, alternatives, likelihood of achieving goals, and potential problems during recovery for the procedure that I have provided informed consent. Date  Time: 12/04/2023 12:43 PM  Pre-Procedure Preparation:  Monitoring: As per clinic protocol. Respiration, ETCO2, SpO2, BP, heart rate and rhythm monitor placed and checked for adequate function Safety Precautions: Patient was assessed for positional comfort and pressure points before starting the procedure. Time-out: I initiated and conducted the Time-out before starting the procedure, as per protocol. The patient was asked to participate by confirming the accuracy of the Time Out information. Verification of the correct person, site, and procedure were performed and confirmed by me, the nursing staff, and the patient. Time-out conducted as per Joint Commission's Universal Protocol (UP.01.01.01). Time: 1320 Start Time: 1320 hrs.  Description of Procedure:           Laterality: (see above) Targeted Levels: (see above)  Safety Precautions: Aspiration looking for blood return was conducted prior to all injections. At no point did we inject any substances, as a needle was being advanced. Before injecting, the patient was told to immediately notify me if he was experiencing any new onset of ringing in the ears, or metallic taste in the mouth. No attempts were made at seeking any paresthesias. Safe injection practices and needle disposal techniques used. Medications properly  checked for expiration dates. SDV (single dose vial) medications used. After the completion of the procedure, all disposable equipment used was discarded in the proper designated medical waste containers. Local Anesthesia: Protocol guidelines were followed. The patient was positioned over the fluoroscopy table. The area was prepped in the usual manner. The time-out was completed. The target area was identified using fluoroscopy. A 12-in long, straight, sterile hemostat was used with fluoroscopic guidance to locate the targets for each level blocked. Once located, the skin was marked with an approved surgical skin marker. Once all sites were marked, the skin (epidermis, dermis, and hypodermis), as well as deeper tissues (fat, connective tissue and muscle) were infiltrated with a small amount of a short-acting local anesthetic, loaded on a 10cc syringe with a 25G, 1.5-in  Needle. An appropriate amount of time was allowed for local anesthetics to take effect before proceeding to the next step. Local Anesthetic: Lidocaine  2.0% The unused portion of the local anesthetic was discarded in the proper designated containers. Technical description of process:  Medial Branch  Dorsal Rami Nerve Block (MBB):  Neuroanatomy note: Each lumbar facet joint receives dual innervation from medial branches arising from the posterior primary rami at the same level and one level above. The target for each lumbar medial  branch is the junction of the ipsilateral superior articular and transverse process of the lower vertebral body. (i.e.: The L4-L5 facet joint is innervated by the L4 medial branch [located at L5] and the L3 medial branch [located at L4]. Blocking the L4 Medial Branch is therefore achieved by injecting at the junction of the ipsilateral superior articular and transverse process of the lower vertebral body [L5].).  Exception: The exception to the above rule is the L5-S1 facet joint which has triple innervation requiring the L4 medial branch, as well as the L5 and the S1 Dorsal Rami(s) to be blocked to fully denervate the joint.  Under fluoroscopic guidance, a needle was inserted until contact was made with os over the target area. After negative aspiration, 2mL of the nerve block solution was injected without difficulty or complication. Paresthesia were avoided during injection. The needle(s) were removed intact and without complication.  Once the entire procedure was completed, the treated area was cleaned, making sure to leave some of the prepping solution back to take advantage of its long term bactericidal properties.         Illustration of the posterior view of the lumbar spine and the posterior neural structures. Laminae of L2 through S1 are labeled. DPRL5, dorsal primary ramus of L5; DPRS1, dorsal primary ramus of S1; DPR3, dorsal primary ramus of L3; FJ, facet (zygapophyseal) joint L3-L4; I, inferior articular process of L4; LB1, lateral branch of dorsal primary ramus of L1; IAB, inferior articular branches from L3 medial branch (supplies L4-L5 facet joint); IBP, intermediate branch plexus; MB3, medial branch of dorsal primary ramus of L3; NR3, third lumbar nerve root; S, superior articular process of L5; SAB, superior articular branches from L4 (supplies L4-5 facet joint also); TP3, transverse process of L3.   Facet Joint Innervation (* possible contribution)  L1-2 T12, L1 (L2*)  Medial Branch   L2-3 L1, L2 (L3*)                     L3-4 L2, L3 (L4*)                     L4-5 L3, L4 (L5*)  L5-S1 L4, L5, S1                        Vitals:   12/04/23 1313 12/04/23 1318 12/04/23 1323 12/04/23 1336  BP: (!) 154/99 129/85 (!) 135/97 130/82  Pulse:      Resp: 16 17 16    Temp:      TempSrc:      SpO2: 99% 99% 98% 98%  Weight:      Height:         End Time: 1325 hrs.  Imaging Guidance (Spinal):         Type of Imaging Technique: Fluoroscopy Guidance (Spinal) Indication(s): Fluoroscopy guidance for needle placement to enhance accuracy in procedures requiring precise needle localization for targeted delivery of medication in or near specific anatomical locations not easily accessible without such real-time imaging assistance. Exposure Time: Please see nurses notes. Contrast: None used. Fluoroscopic Guidance: I was personally present during the use of fluoroscopy. Tunnel Vision Technique used to obtain the best possible view of the target area. Parallax error corrected before commencing the procedure. Direction-depth-direction technique used to introduce the needle under continuous pulsed fluoroscopy. Once target was reached, antero-posterior, oblique, and lateral fluoroscopic projection used confirm needle placement in all planes. Images permanently stored in EMR. Interpretation: No contrast injected. I personally interpreted the imaging intraoperatively. Adequate needle placement confirmed in multiple planes. Permanent images saved into the patient's record.  Post-operative Assessment:  Post-procedure Vital Signs:  Pulse/HCG Rate: 7274 Temp: (!) 97.4 F (36.3 C) Resp: 16 BP: 130/82 SpO2: 98 %  EBL: None  Complications: No immediate post-treatment complications observed by team, or reported by patient.  Note: The patient tolerated the entire procedure well. A repeat set of vitals were taken after the procedure and the patient was kept under  observation following institutional policy, for this type of procedure. Post-procedural neurological assessment was performed, showing return to baseline, prior to discharge. The patient was provided with post-procedure discharge instructions, including a section on how to identify potential problems. Should any problems arise concerning this procedure, the patient was given instructions to immediately contact us , at any time, without hesitation. In any case, we plan to contact the patient by telephone for a follow-up status report regarding this interventional procedure.  Comments:  No additional relevant information.  Plan of Care (POC)  Orders:  No orders of the defined types were placed in this encounter.   Medications ordered for procedure: Meds ordered this encounter  Medications   lidocaine  (XYLOCAINE ) 2 % (with pres) injection 400 mg   lactated ringers  infusion   midazolam  (VERSED ) injection 0.5-2 mg    Make sure Flumazenil is available in the pyxis when using this medication. If oversedation occurs, administer 0.2 mg IV over 15 sec. If after 45 sec no response, administer 0.2 mg again over 1 min; may repeat at 1 min intervals; not to exceed 4 doses (1 mg)   ropivacaine  (PF) 2 mg/mL (0.2%) (NAROPIN ) injection 18 mL   dexamethasone  (DECADRON ) injection 20 mg   Medications administered: We administered lidocaine , lactated ringers , midazolam , ropivacaine  (PF) 2 mg/mL (0.2%), and dexamethasone .  See the medical record for exact dosing, route, and time of administration.    B/L L3-5 MBNB 10/02/23, 12/04/23    Follow-up plan:   Return in about 2 weeks (around 12/18/2023) for PPE, VV, discuss RFA.     Recent Visits Date Type Provider Dept  11/09/23 Office Visit Marcelino Nurse, MD [facility]  10/02/23 Procedure visit Marcelino Nurse, MD [facility]  09/14/23 Office Visit Marcelino Nurse, MD [facility]  Showing recent visits within past 90 days and meeting  all other requirements Today's Visits Date Type Provider Dept  12/04/23 Procedure visit Marcelino Nurse, MD [facility]  Showing today's visits and meeting all other requirements Future Appointments Date Type Provider Dept  12/20/23 Appointment Marcelino Nurse, MD [facility]  Showing future appointments within next 90 days and meeting all other requirements   Disposition: Discharge home  Discharge (Date  Time): 12/04/2023; 1338 hrs.   Primary Care Physician: Wendee Lynwood HERO, NP Location: [facility] Note by: Nurse Marcelino, MD (TTS technology used. I apologize for any typographical errors that were not detected and corrected.) Date: 12/04/2023; Time: 1:45 PM  Disclaimer:  Medicine is not an Visual merchandiser. The only guarantee in medicine is that nothing is guaranteed. It is important to note that the decision to proceed with this intervention was based on the information collected from the patient. The Data and conclusions were drawn from the patient's questionnaire, the interview, and the physical examination. Because the information was provided in large part by the patient, it cannot be guaranteed that it has not been purposely or unconsciously manipulated. Every effort has been made to obtain as much relevant data as possible for this evaluation. It is important to note that the conclusions that lead to this procedure are derived in large part from the available data. Always take into account that the treatment will also be dependent on availability of resources and existing treatment guidelines, considered by other Pain Management Practitioners as being common knowledge and practice, at the time of the intervention. For Medico-Legal purposes, it is also important to point out that variation in procedural techniques and pharmacological choices are the acceptable norm. The indications, contraindications, technique, and results of the above procedure  should only be interpreted and judged by a Board-Certified Interventional Pain Specialist with extensive familiarity and expertise in the same exact procedure and technique.

## 2023-12-06 ENCOUNTER — Telehealth: Payer: Self-pay

## 2023-12-06 NOTE — Telephone Encounter (Signed)
 Post procedure follow up.  Patient states he is doing sore.

## 2023-12-11 ENCOUNTER — Other Ambulatory Visit: Payer: Self-pay | Admitting: Student in an Organized Health Care Education/Training Program

## 2023-12-11 DIAGNOSIS — M5136 Other intervertebral disc degeneration, lumbar region with discogenic back pain only: Secondary | ICD-10-CM

## 2023-12-11 DIAGNOSIS — M47816 Spondylosis without myelopathy or radiculopathy, lumbar region: Secondary | ICD-10-CM

## 2023-12-12 ENCOUNTER — Telehealth: Payer: Self-pay | Admitting: Internal Medicine

## 2023-12-12 NOTE — Telephone Encounter (Signed)
 Inbound call from patient states for the past two  days he has had dark red blood in stool. He also has lack of appetite as well as low energy. Requesting to speak with a nurse. Please advise.   Thank you

## 2023-12-12 NOTE — Telephone Encounter (Signed)
 Spoke with pt and he has noticed some darker red blood yesterday and this am. He has a hx of hemorrhoids but that is usually BRB. He also reports he has not has much of an appetite as well as low energy. Pt scheduled to see Colleen tomorrow at 11am. Pt aware of appt and knows to go to the ER if the bleeding gets worse, he has any SOB, dizziness, increased weakness. Pt verbalized understanding.

## 2023-12-13 ENCOUNTER — Ambulatory Visit (INDEPENDENT_AMBULATORY_CARE_PROVIDER_SITE_OTHER): Admitting: Nurse Practitioner

## 2023-12-13 ENCOUNTER — Encounter: Payer: Self-pay | Admitting: Nurse Practitioner

## 2023-12-13 ENCOUNTER — Other Ambulatory Visit

## 2023-12-13 ENCOUNTER — Telehealth: Payer: Self-pay

## 2023-12-13 VITALS — BP 130/90 | HR 68 | Ht 66.75 in | Wt 179.2 lb

## 2023-12-13 DIAGNOSIS — R5383 Other fatigue: Secondary | ICD-10-CM | POA: Diagnosis not present

## 2023-12-13 DIAGNOSIS — R21 Rash and other nonspecific skin eruption: Secondary | ICD-10-CM

## 2023-12-13 DIAGNOSIS — K625 Hemorrhage of anus and rectum: Secondary | ICD-10-CM | POA: Diagnosis not present

## 2023-12-13 DIAGNOSIS — B3789 Other sites of candidiasis: Secondary | ICD-10-CM

## 2023-12-13 DIAGNOSIS — K6289 Other specified diseases of anus and rectum: Secondary | ICD-10-CM

## 2023-12-13 DIAGNOSIS — K219 Gastro-esophageal reflux disease without esophagitis: Secondary | ICD-10-CM | POA: Diagnosis not present

## 2023-12-13 DIAGNOSIS — Z8719 Personal history of other diseases of the digestive system: Secondary | ICD-10-CM

## 2023-12-13 LAB — SEDIMENTATION RATE: Sed Rate: 5 mm/h (ref 0–20)

## 2023-12-13 MED ORDER — AMBULATORY NON FORMULARY MEDICATION: 1 refills | Status: AC

## 2023-12-13 MED ORDER — NYSTATIN 100000 UNIT/GM EX CREA: 1.0000 | TOPICAL_CREAM | Freq: Three times a day (TID) | CUTANEOUS | 1 refills | Status: AC

## 2023-12-13 NOTE — Telephone Encounter (Signed)
 Urgent referral sent to [facility] successfully [phone] phone number [phone]. I was told they have the referral by [facility] and will reach out to the nurse about getting him in sooner than a first available and will call patient to schedule this

## 2023-12-13 NOTE — Progress Notes (Signed)
 12/13/2023 Bobby Mcbride [id number] [birth year]   Chief Complaint: Rectal pain and rectal bleeding  History of Present Illness: Bobby Mcbride is a 53 year old male with a past medical history of arthritis, hepatic steatosis, peptic esophageal stricture and GERD.  Previously followed by Dr. Teressa, now by Dr. Abran.  He was last seen in office by Dr. Abran 11/07/2023 and at that time he endorsed having a repeat dysphagia with solid foods.  He was instructed to continue Pantoprazole  40 mg twice daily and an EGD was completed 11/07/2023 which showed a small hiatal hernia, benign distal esophageal stricture status post dilatation and healed erosive esophagitis.  He presents today for further evaluation regarding rectal pain and rectal bleeding and also endorsed having a nonproductive cough which started 3 weeks following his EGD 10/2023.  He is accompanied by his wife. He endorses having rectal pain with associated very red rash around his perianal area for the past 3 to 4 weeks.  He is passing normal formed brown stools daily without straining.  On Monday 9/29, he passed a normal bowel movement with a small amount of dark red blood with clots and later that afternoon at work passed a second similar bowel movement with dark red blood.  He passed a normal bowel movement yesterday and today without further blood per the rectum.  His rectal pain is constant and is worse when passing a BM.  He is using Desitin for his perianal rash. He endorses feeling fatigued.  No fevers.  He has lost 6 pounds over the past 4 weeks.  He takes Diclofenac  75 mg once daily for arthritis pain approximately 5 to 10 days monthly.  He also endorses having a red rash to his bellybutton for which he has applied Hydrocortisone  cream for the past 3 weeks without improvement.  He stated he contacted his PCP regarding the rash to his bellybutton area and a dermatology referral was entered but he has not yet been scheduled for an  appointment.  He underwent a colonoscopy 09/01/2021 by Dr. Teressa which showed a 1 cm rubbery perianal nodule and small internal hemorrhoids otherwise was normal.  He was referred to colorectal surgery to biopsy the perianal nodule.  He was seen by Dr. Lonni Pizza with [facility] on 09/28/2021 who identified 2 rubber nodules at the anal opening, DRE/anoscopy unable to be performed due to pain intolerance/pelvic floor spasms.  He underwent anorectal examination under anesthesia with excision of perianal lesions and punch biopsies of the anal margin 10/15/2021, pathology showed psoriasiform dermatitis.  He was prescribed Anusol  2.5% rectal cream apply topically 3 times daily for 30 days and to continue taking a fiber supplement.      Latest Ref Rng & Units 08/02/2023   12:00 AM 06/11/2023    7:44 PM 04/30/2023    7:30 PM  CBC  WBC  10.5     9.9  17.1   Hemoglobin 13.5 - 17.5 17.5     16.6  17.9   Hematocrit 41 - 53 53     46.7  50.7   Platelets 150 - 400 K/uL 279     237  229      This result is from an external source.        Latest Ref Rng & Units 08/02/2023   12:00 AM 06/11/2023    7:44 PM 04/30/2023    7:30 PM  CMP  Glucose 70 - 99 mg/dL  90  865   BUN 4 -  21 12     12  12    Creatinine 0.6 - 1.3 1.2     1.12  1.16   Sodium 137 - 147 138     137  138   Potassium 3.5 - 5.1 mEq/L 4.0     4.2  4.5   Chloride 99 - 108 98     100  102   CO2 22 - 32 mmol/L  30  26   Calcium 8.7 - 10.7 9.2     9.1  9.6   Total Protein 6.5 - 8.1 g/dL  7.3  7.6   Total Bilirubin 0.0 - 1.2 mg/dL  0.5  1.5   Alkaline Phos 25 - 125 86     71  62   AST 14 - 40 33     20  30   ALT 10 - 40 U/L 45     28  40      This result is from an external source.    CTAP with contrast 04/30/2023: FINDINGS: Lower chest: Included lung bases are clear.  Heart size is normal.   Hepatobiliary: Diffusely decreased attenuation of the hepatic parenchyma. No focal liver lesion is identified. Unremarkable gallbladder. No hyperdense  gallstone. No biliary dilatation.   Pancreas: Unremarkable. No pancreatic ductal dilatation or surrounding inflammatory changes.   Spleen: Normal in size without focal abnormality.   Adrenals/Urinary Tract: Unremarkable adrenal glands. Kidneys enhance symmetrically without focal lesion, stone, or hydronephrosis. Ureters are nondilated. Urinary bladder appears unremarkable for the degree of distention.   Stomach/Bowel: Stomach is within normal limits. Appendix is surgically absent. Multiple fluid-filled nondilated loops of small bowel and small amount of liquid stool in the colon. No evidence of bowel wall thickening, distention, or inflammatory changes.   Vascular/Lymphatic: No significant vascular findings are present. No enlarged abdominal or pelvic lymph nodes.   Reproductive: Prostate is unremarkable.   Other: No free fluid. No abdominopelvic fluid collection. No pneumoperitoneum. No abdominal wall hernia.   Musculoskeletal: No acute or significant osseous findings.   IMPRESSION: 1. Multiple fluid-filled non-dilated loops of small bowel and small amount of liquid stool in the colon. Appearance suggest a nonspecific enteritis/diarrheal illness. 2. Hepatic steatosis.  GI PROCEDURES:  07/2011 EGD showed non-bleeding Mallory Weiss tear, mild duodenitis and also nodularity at GE junction, biopsies actually showed inflamed cardia. No h.pylori.  Colonoscopy 08/2011 normal other than hemorrhoids 11/2017 EGD LA grade A esophagitis, ortherwise normal 09/2018 EGD LA Grade A esophagitis , mild stenosis at GEJ dilated to 18mm- had laryngeal spasm and unable to proceed further. Pathology shows gastritis, no dysplasia.   EGD 04/2021 Dr. Teressa indication dysphagia, findings esophagitis, focal peptic appearing stricture that was biopsied.  Biopsies only showed excessive eosinophils but no sign of tumor or cancer.  GE junction was dilated to 20 mm, he was advised to stay on PPI twice daily for  at least 2 or 3 months and then back down to once daily.   Colonoscopy 08/31/2021:  Identified a 1 cm rubbery perianal nodule of uncertain etiology. Colon was otherwise normal with exception of some small internal hemorrhoids.    EGD 11/07/2023: 1. GERD  2. History of erosive esophagitis. Healed on twice daily PPI  3. Benign distal esophageal stricture status post dilation to 20 mm  4. Small hiatal hernia. Otherwise normal EGD.  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 07/2022 Echo: EF 60-65%, no rwma, nl RV size/fxn.  No significant valvular disease.   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 per pt dx age 18; b. 07/2022 Echo: No MVP/MR.   Perianal lesion    Pre-syncope    a. 07/2022 Zio x 2 (total 4 wks): 1.  Predominantly sinus rhythm with average heart rate of 64 (38-144).  Rare PACs and PVCs. 2.  Predominantly sinus rhythm at average of 58 (37-99).  Rare PACs and PVCs.   Past Surgical History:  Procedure Laterality Date   APPENDECTOMY  1990   BALLOON DILATION N/A 02/01/2023   Procedure: BALLOON DILATION;  Surgeon: Avram Lupita BRAVO, MD;  Location: [facility] ENDOSCOPY;  Service: Gastroenterology;  Laterality: N/A;   BIOPSY  09/03/2018   Procedure: BIOPSY;  Surgeon: Charlanne Groom, MD;  Location: [facility] ENDOSCOPY;  Service: Endoscopy;;   BIOPSY  02/01/2023   Procedure: BIOPSY;  Surgeon: Avram Lupita BRAVO, MD;  Location: [facility] ENDOSCOPY;  Service: Gastroenterology;;   COLONOSCOPY  WITH ESOPHAGOGASTRODUODENOSCOPY (EGD)  08/31/2021   by dr teressa   ELBOW SURGERY Right 1987   ESOPHAGOGASTRODUODENOSCOPY  08/18/2011   Procedure: ESOPHAGOGASTRODUODENOSCOPY (EGD);  Surgeon: Gordy CHRISTELLA Starch, MD;  Location: [facility];  Service: Gastroenterology;  Laterality: N/A;   ESOPHAGOGASTRODUODENOSCOPY  11/11/2011   Procedure: ESOPHAGOGASTRODUODENOSCOPY (EGD);  Surgeon: Lupita BRAVO Avram, MD;  Location: [facility];  Service: Endoscopy;  Laterality: N/A;   ESOPHAGOGASTRODUODENOSCOPY (EGD) WITH PROPOFOL  N/A 09/03/2018   Procedure: ESOPHAGOGASTRODUODENOSCOPY (EGD) WITH PROPOFOL ;  Surgeon: Charlanne Groom, MD;  Location: [facility] ENDOSCOPY;  Service: Endoscopy;  Laterality: N/A;   ESOPHAGOGASTRODUODENOSCOPY (EGD) WITH PROPOFOL  N/A 11/24/2021   Procedure: ESOPHAGOGASTRODUODENOSCOPY (EGD) WITH PROPOFOL ;  Surgeon: Abran Norleen SAILOR, MD;  Location: [facility] ENDOSCOPY;  Service: Gastroenterology;  Laterality: N/A;   ESOPHAGOGASTRODUODENOSCOPY (EGD) WITH PROPOFOL  N/A 02/01/2023   Procedure: ESOPHAGOGASTRODUODENOSCOPY (EGD) WITH PROPOFOL ;  Surgeon: Avram Lupita BRAVO, MD;  Location: [facility] ENDOSCOPY;  Service: Gastroenterology;  Laterality: N/A;   KNEE ARTHROSCOPY Right 02/26/2009   @[facility]  by dr c. blackman   MASS EXCISION N/A 10/15/2021   Procedure: EXCISION OF PERIANAL MASS;  Surgeon: Teresa Lonni CHRISTELLA, MD;  Location: [facility];  Service: General;  Laterality: N/A;   MULTIPLE TOOTH EXTRACTIONS     several ---  w/ IV sedation  last extraction 05/ 2023   RECTAL EXAM UNDER ANESTHESIA N/A 10/15/2021   Procedure: ANORECTAL EXAM UNDER ANESTHESIA;  Surgeon: Teresa Lonni CHRISTELLA, MD;  Location: [facility];  Service: General;  Laterality: N/A;   SHOULDER ARTHROSCOPY W/ ROTATOR CUFF REPAIR Right 2012   SKIN BIOPSY N/A 10/15/2021   Procedure: BIOPSY OF GLUTEAL SKIN;  Surgeon: Teresa Lonni CHRISTELLA, MD;  Location: [facility];  Service: General;  Laterality: N/A;   Current Outpatient  Medications on File Prior to Visit  Medication Sig Dispense Refill   diclofenac  (VOLTAREN ) 75 MG EC tablet Take 1 tablet (75 mg total) by mouth 2 (two) times daily. 30 tablet  1   hydrocortisone  2.5 % cream Apply topically 2 (two) times daily. 30 g 0   mupirocin  ointment (BACTROBAN ) 2 % Apply 1 Application topically 2 (two) times daily. 22 g 0   pantoprazole  (PROTONIX ) 40 MG tablet Take 1 tablet (40 mg total) by mouth 2 (two) times daily before a meal. 180 tablet 3   No current facility-administered medications on file prior to visit.   Allergies  Allergen Reactions   Codeine Other (See Comments)    Hallucinations   Food Nausea And Vomiting    Pepperoni-nausea/vomiting Lettuce-nausea/vomiting Oranges-nausea/vomiting Cheese-nausea/vomiting   Hydrocodone -Acetaminophen  Other (See Comments)    Hallucinations   Ibuprofen Other (See Comments)    ulcers in my stomach   Compazine  [Prochlorperazine  Edisylate] Anxiety   Current Medications, Allergies, Past Medical History, Past Surgical History, Family History and Social History were reviewed in [employer] record.  Review of Systems:   Constitutional: See HPI. Respiratory: Negative for shortness of breath.   Cardiovascular: Negative for chest pain, palpitations and leg swelling.  Gastrointestinal: See HPI.  Musculoskeletal: Negative for back pain or muscle aches.  Neurological: Negative for dizziness, headaches or paresthesias.   Physical Exam: BP (!) 130/90 (BP Location: Left Arm, Patient Position: Sitting, Cuff Size: Normal)   Pulse 68   Ht 5' 6.75 (1.695 m) Comment: height measured without shoes  Wt 179 lb 4 oz (81.3 kg)   BMI 28.29 kg/m  General: 53 year old male in no acute distress. Head: Normocephalic and atraumatic. Eyes: No scleral icterus. Conjunctiva pink . Ears: Normal auditory acuity. Mouth: Dentition intact. No ulcers or lesions.  Lungs: Clear throughout to auscultation. Heart: Regular rate  and rhythm, no murmur. Abdomen: Soft, nontender and nondistended. No masses or hepatomegaly. Normal bowel sounds x 4 quadrants.  Erythematous plaques to the umbilicus. See photo below:   Rectal: Perianal erythema with excoriations, likely due to psoriasiform dermatitis with secondary candidiasis. Small posterior fissure tender without active bleeding.  Patient declined internal anal exam secondary to pain intolerance. See photo below.    Musculoskeletal: Symmetrical with no gross deformities. Extremities: No edema. Neurological: Alert oriented x 4. No focal deficits.  Psychological: Alert and cooperative. Normal mood and affect  Assessment and Recommendations:  53 year old male presents with rectal pain x 3 to 4 weeks and rectal bleeding, passed dark red blood with clots x 2 on 9/29.  Colonoscopy 08/2021 showed internal hemorrhoids, anal nodules (subsequent biopsies per colorectal surgery confirmed psoriasiform dermatitis treated with topical steroid 2023). - CBC, CMP, CRP and sed rate - Nystatin cream apply a small amount to the perianal area 3 times daily x 3 weeks - Diltiazem 2%/Lidocaine  5% fissure cream apply a small amount inside the anal opening 3 times daily x 6 weeks - Avoid aggressive wiping after passing a bowel movement - Patient to contact PCP to expedite dermatology evaluation, referral previously provided per PCP - Patient will require a anoscopy exam under sedation to further evaluate rectal pain with new onset dark red blood and clots per the rectum, referred to Dr. Lonni Pizza - I will consult with Dr. Abran to further discuss if a flexible sigmoidoscopy is warranted at this juncture - Patient instructed to avoid Diclofenac  or other NSAIDs - MiraLAX nightly as tolerated to facilitate easier bowel movements without straining  Periumbilical rash, suspect psoriasis component.  Patient has applied topical Hydrocortisone  x 3 weeks which may be exacerbating this  condition. - Stop Hydrocortisone  - Await further recommendations per dermatology  GERD, esophageal  stricture.  EGD 11/07/2023 showed a benign distal esophageal stricture which was dilated and a small hiatal hernia otherwise was unremarkable.  Patient endorses having a dry cough which started 3 weeks post EGD.  No shortness of breath or chest pain. - Continue Pantoprazole  40 mg once daily - Famotidine  20 mg 1 tab at bedtime OTC - Follow-up with PCP regarding persistent cough

## 2023-12-13 NOTE — Patient Instructions (Addendum)
 _______________________________________________________  If your blood pressure at your visit was 140/90 or greater, please contact your primary care physician to follow up on this.  _______________________________________________________  If you are age 54 or older, your body mass index should be between 23-30. Your Body mass index is 28.29 kg/m. If this is out of the aforementioned range listed, please consider follow up with your Primary Care Provider.  If you are age 73 or younger, your body mass index should be between 19-25. Your Body mass index is 28.29 kg/m. If this is out of the aformentioned range listed, please consider follow up with your Primary Care Provider.   ________________________________________________________  The [facility] providers would like to encourage you to use MYCHART to communicate with providers for non-urgent requests or questions.  Due to long hold times on the telephone, sending your provider a message by [facility] may be a faster and more efficient way to get a response.  Please allow 48 business hours for a response.  Please remember that this is for non-urgent requests.  _______________________________________________________  [facility] is using a team-based approach to care.  Your team is made up of your doctor and two to three APPS. Our APPS (Nurse Practitioners and Physician Assistants) work with your physician to ensure care continuity for you. They are fully qualified to address your health concerns and develop a treatment plan. They communicate directly with your gastroenterologist to care for you. Seeing the Advanced Practice Practitioners on your physician's team can help you by facilitating care more promptly, often allowing for earlier appointments, access to diagnostic testing, procedures, and other specialty referrals.   Your provider has requested that you go to the basement level for lab work before leaving today. Press B on the  elevator. The lab is located at the first door on the left as you exit the elevator.  Please purchase the following medications over the counter and take as directed: Miralax at night  We have sent the following medications to your pharmacy for you to pick up at your convenience: Nystatin  [facility]'s information is below: Address: [address], [place], [place] [address]  [phone]  *Please DO NOT go directly from our office to pick up this medication! Give the pharmacy 1 day to process the prescription as this is compounded and takes time to make.  A referral will be sent to [facility] please them on Monday if you haven't heard anything from them. Phone number [phone]  Thank you for trusting me with your gastrointestinal care!   Elida Shawl, CRNP

## 2023-12-13 NOTE — Telephone Encounter (Signed)
 Verbal sent to [facility] for the Diltiazem 2% gel/Lidocaine  5% since fax failed. Given to Jamie

## 2023-12-14 ENCOUNTER — Ambulatory Visit: Payer: Self-pay | Admitting: Nurse Practitioner

## 2023-12-14 DIAGNOSIS — D582 Other hemoglobinopathies: Secondary | ICD-10-CM

## 2023-12-14 DIAGNOSIS — R7989 Other specified abnormal findings of blood chemistry: Secondary | ICD-10-CM

## 2023-12-14 LAB — COMPREHENSIVE METABOLIC PANEL WITH GFR
ALT: 74 U/L — ABNORMAL HIGH (ref 0–53)
AST: 44 U/L — ABNORMAL HIGH (ref 0–37)
Albumin: 4.8 g/dL (ref 3.5–5.2)
Alkaline Phosphatase: 71 U/L (ref 39–117)
BUN: 10 mg/dL (ref 6–23)
CO2: 29 meq/L (ref 19–32)
Calcium: 9.7 mg/dL (ref 8.4–10.5)
Chloride: 100 meq/L (ref 96–112)
Creatinine, Ser: 1.16 mg/dL (ref 0.40–1.50)
GFR: 71.51 mL/min (ref >60.00)
Glucose, Bld: 86 mg/dL (ref 70–99)
Potassium: 4.9 meq/L (ref 3.5–5.1)
Sodium: 138 meq/L (ref 135–145)
Total Bilirubin: 0.7 mg/dL (ref 0.2–1.2)
Total Protein: 8.1 g/dL (ref 6.0–8.3)

## 2023-12-14 LAB — CBC WITH DIFFERENTIAL/PLATELET
Basophils Absolute: 0.1 K/uL (ref 0.0–0.1)
Basophils Relative: 1.5 % (ref 0.0–3.0)
Eosinophils Absolute: 0.5 K/uL (ref 0.0–0.7)
Eosinophils Relative: 5.2 % — ABNORMAL HIGH (ref 0.0–5.0)
HCT: 51.6 % (ref 39.0–52.0)
Hemoglobin: 17.3 g/dL — ABNORMAL HIGH (ref 13.0–17.0)
Lymphocytes Relative: 19.7 % (ref 12.0–46.0)
Lymphs Abs: 1.8 K/uL (ref 0.7–4.0)
MCHC: 33.5 g/dL (ref 30.0–36.0)
MCV: 88.9 fl (ref 78.0–100.0)
Monocytes Absolute: 1.3 K/uL — ABNORMAL HIGH (ref 0.1–1.0)
Monocytes Relative: 14 % — ABNORMAL HIGH (ref 3.0–12.0)
Neutro Abs: 5.3 K/uL (ref 1.4–7.7)
Neutrophils Relative %: 59.6 % (ref 43.0–77.0)
Platelets: 227 K/uL (ref 150.0–400.0)
RBC: 5.81 Mil/uL (ref 4.22–5.81)
RDW: 13.7 % (ref 11.5–15.5)
WBC: 8.9 K/uL (ref 4.0–10.5)

## 2023-12-14 LAB — C-REACTIVE PROTEIN: CRP: 1 mg/dL (ref 0.5–20.0)

## 2023-12-18 DIAGNOSIS — M5459 Other low back pain: Secondary | ICD-10-CM | POA: Diagnosis not present

## 2023-12-18 DIAGNOSIS — K625 Hemorrhage of anus and rectum: Secondary | ICD-10-CM | POA: Diagnosis not present

## 2023-12-18 DIAGNOSIS — L408 Other psoriasis: Secondary | ICD-10-CM | POA: Diagnosis not present

## 2023-12-19 ENCOUNTER — Telehealth: Payer: Self-pay | Admitting: Internal Medicine

## 2023-12-19 ENCOUNTER — Telehealth: Payer: Self-pay | Admitting: *Deleted

## 2023-12-19 NOTE — Telephone Encounter (Signed)
 Hello Dr. Abran.  Pt is requesting a nurse call him for information regarding is upcoming colonoscopy.  Thanks [employer]

## 2023-12-19 NOTE — Telephone Encounter (Signed)
 Called for pre telephone appt assessment. No answer, message left.

## 2023-12-19 NOTE — Telephone Encounter (Signed)
 Pt was seen by [facility], referred by [facility], and referred back to Dr. Abran for colon. He has blood in his stool. Pt scheduled for phone previsit 12/20/23@4pm . Colon scheduled in the lec 12/26/23@12 :30PM. Pt aware of appts.

## 2023-12-20 ENCOUNTER — Ambulatory Visit

## 2023-12-20 ENCOUNTER — Ambulatory Visit
Attending: Student in an Organized Health Care Education/Training Program | Admitting: Student in an Organized Health Care Education/Training Program

## 2023-12-20 VITALS — Ht 67.0 in | Wt 179.0 lb

## 2023-12-20 DIAGNOSIS — G894 Chronic pain syndrome: Secondary | ICD-10-CM | POA: Diagnosis not present

## 2023-12-20 DIAGNOSIS — M47816 Spondylosis without myelopathy or radiculopathy, lumbar region: Secondary | ICD-10-CM | POA: Diagnosis not present

## 2023-12-20 DIAGNOSIS — K921 Melena: Secondary | ICD-10-CM

## 2023-12-20 MED ORDER — NA SULFATE-K SULFATE-MG SULF 17.5-3.13-1.6 GM/177ML PO SOLN: 1.0000 | Freq: Once | ORAL | 0 refills | Status: AC

## 2023-12-20 NOTE — Progress Notes (Signed)
 No egg or soy allergy known to patient  No issues known to pt with past sedation with any surgeries or procedures Patient denies ever being told they had issues or difficulty with intubation  No FH of Malignant Hyperthermia Pt is not on diet pills Pt is not on  home 02  Pt is not on blood thinners  Pt denies issues with constipation  No A fib or A flutter Have any cardiac testing pending--No Pt can ambulate  Pt denies use of chewing tobacco Discussed diabetic I weight loss medication holds Discussed NSAID holds Checked BMI Pt instructed to use Singlecare.com or GoodRx for a price reduction on prep , Good RX coupon provided  Patient states he will pick up colonoscopy instructions on 12/22/23, will leave them at 2nd floor check in desk.  Patient's chart reviewed  Pre visit completed

## 2023-12-20 NOTE — Progress Notes (Signed)
 PROVIDER NOTE: Interpretation of information contained herein should be left to medically-trained personnel. Specific patient instructions are provided elsewhere under Patient Instructions section of medical record. This document was created in part using AI and STT-dictation technology, any transcriptional errors that may result from this process are unintentional.  Patient: Bobby Bobby  Service: E/M   PCP: Bobby Lynwood HERO, NP  DOB: 07/15/1970  DOS: 12/20/2023  Provider: Wallie Sherry, MD  MRN: [id number]  Delivery: Virtual Visit  Specialty: Interventional Pain Management  Type: Established Patient  Setting: Ambulatory outpatient facility  Specialty designation: 09  Referring Prov.: Bobby Lynwood HERO, NP  Location: Remote location       Virtual Encounter - Pain Management PROVIDER NOTE: Information contained herein reflects review and annotations entered in association with encounter. Interpretation of such information and data should be left to medically-trained personnel. Information provided to patient can be located elsewhere in the medical record under Patient Instructions. Document created using STT-dictation technology, any transcriptional errors that may result from process are unintentional.    Contact & Pharmacy Preferred: [phone] Home: [phone] (home) Mobile: [phone] (mobile) E-mail: [email or web address]  [facility] [phone] [place], [place] - [address] AT [facility] [address] [place] [address] Phone: [phone] Fax: [phone]   Pre-screening  Bobby Bobby offered in-person vs virtual encounter. He indicated preferring virtual for this encounter.   Reason COVID-19*  Social distancing based on CDC and AMA recommendations.   I contacted Bobby Bobby on 12/20/2023 via telephone.      I clearly identified myself as Bobby Sherry, MD. I verified that I was speaking with the correct person using two  identifiers (Name: Bobby Bobby, and date of birth: 05-Oct-1970).  Consent I sought verbal advanced consent from Bobby Bobby for virtual visit interactions. I informed Bobby Bobby of possible security and privacy concerns, risks, and limitations associated with providing not-in-person medical evaluation and management services. I also informed Bobby Bobby of the availability of in-person appointments. Finally, I informed him that there would be a charge for the virtual visit and that he could be  personally, fully or partially, financially responsible for it. Bobby Bobby expressed understanding and agreed to proceed.   Historic Elements   Bobby Bobby is a 53 y.o. year old, male patient evaluated today after our last contact on 12/11/2023. 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 He also  has a past surgical history that includes Elbow surgery (Right, 1987); Appendectomy (1990); Knee arthroscopy (Right, 02/26/2009); Shoulder arthroscopy w/ rotator cuff repair (Right, 2012); Esophagogastroduodenoscopy (08/18/2011); Esophagogastroduodenoscopy (11/11/2011); Esophagogastroduodenoscopy (egd) with propofol  (N/A, 09/03/2018); biopsy (09/03/2018); Colonoscopy with esophagogastroduodenoscopy (egd) (08/31/2021); Multiple tooth extractions; Mass excision (N/A, 10/15/2021); Skin biopsy (N/A, 10/15/2021); Rectal exam under anesthesia (N/A, 10/15/2021); Esophagogastroduodenoscopy (egd) with propofol  (N/A, 11/24/2021); Esophagogastroduodenoscopy (egd) with propofol  (N/A, 02/01/2023); biopsy (02/01/2023); and Balloon dilation (N/A, 02/01/2023). Mr. Mcbride has a current  medication list which includes the following prescription(s): AMBULATORY NON FORMULARY MEDICATION, celecoxib , diclofenac , hydrocortisone , mupirocin  ointment, nystatin cream, and pantoprazole . He  reports that he has never smoked. He has never used smokeless tobacco. He reports that he does not drink alcohol and does not use drugs. Bobby Bobby  is allergic to codeine, food, hydrocodone -acetaminophen , ibuprofen, and compazine  [prochlorperazine  edisylate].  BMI: Estimated body mass index is 28.29 kg/m as calculated from the following:   Height as of 12/13/23: 5' 6.75 (1.695 m).   Weight as of 12/13/23: 179 lb 4 oz (81.3 kg). Last encounter: 11/09/2023. Last procedure: 12/04/2023.  HPI  Today, he is being contacted for a post-procedure assessment. Post-Procedure Evaluation   Type: Lumbar Facet, Medial Branch Block(s) (w/ fluoroscopic mapping) #2  Laterality: Bilateral  Level: L3, L4, and L5 Medial Branch/Dorsal Rami Level(s). Injecting these levels blocks the L3-4 and L4-5 lumbar facet joints. Imaging: Fluoroscopic guidance Spinal ([id number]) Anesthesia: Local anesthesia (1-2% Lidocaine ) Sedation:   minimal         DOS: 12/04/2023 Performed by: Bobby Sherry, MD  Primary Purpose: Diagnostic/Therapeutic Indications: Low back pain severe enough to impact quality of life or function. 1. Lumbar facet arthropathy   2. Lumbar spondylosis   3. Degeneration of intervertebral disc of lumbar region with discogenic back pain    NAS-11 Pain score:   Pre-procedure: 8 /10   Post-procedure: 0-No pain/10     Effectiveness:  Initial hour after procedure: 80% Subsequent 4-6 hours post-procedure:80% Analgesia past initial 6 hours: 80% Ongoing improvement:  Analgesic:  80% for 5 days      ROS  Constitutional: Denies any fever or chills Gastrointestinal: No reported hemesis, hematochezia, vomiting, or acute GI distress Musculoskeletal: Positive low back pain Neurological: No reported episodes of acute  onset apraxia, aphasia, dysarthria, agnosia, amnesia, paralysis, loss of coordination, or loss of consciousness Note: Above Lab results reviewed.  Recent Imaging Review  MR Lumbar Spine w/o contrast   Narrative CLINICAL DATA:  Low back pain, symptoms persist with > 6 wks treatment   EXAM: MRI LUMBAR SPINE WITHOUT CONTRAST   TECHNIQUE: Multiplanar, multisequence MR imaging of the lumbar spine was performed. No intravenous contrast was administered.   COMPARISON:  MR L Spine 05/28/19   FINDINGS: Segmentation: In keeping with prior numbering convention, there is lumbarization of S1 with the last well-formed disc space labeled S1-S2   Alignment:  Trace retrolisthesis of L4 on L5.   Vertebrae: No fracture, evidence of discitis, or bone lesion. Redemonstrated hypoplastic left sided posterior elements at S1, unchanged   Conus medullaris and cauda equina: Conus extends to the L2 level. Conus and cauda equina appear normal.   Paraspinal and other soft tissues: Negative.   Disc levels:   T12-L1: Only imaged in the sagittal plane. No evidence of high-grade spinal canal or neural foraminal stenosis.   L1-L2: Mild bilateral facet degenerative change. No significant disc bulge. No spinal canal narrowing. No neural foraminal narrowing.   L2-L3: Mild bilateral facet degenerative change. No significant disc bulge. No spinal canal narrowing. Mild left neural foraminal narrowing.   L3-L4: Mild bilateral facet degenerative change. No significant disc bulge. No spinal canal narrowing. No neural foraminal narrowing.   L4-L5: Mild bilateral facet degenerative change. No significant disc bulge. No spinal canal narrowing. No significant neural foraminal narrowing.   L5-S1: Moderate right facet degenerative change. Mild spinal canal narrowing. No neural foraminal narrowing.   IMPRESSION: Transitional anatomy with lumbarization of S1 and partial absence of the left S1 pedicle and  posterior elements.   1. No acute osseous abnormality. 2. No significant change from prior exam. No evidence of high grade spinal canal or neuroforaminal narrowing.     Electronically Signed By: Lyndall Gore M.D. On: 12/21/2022 07:46 Note: Reviewed  Laboratory Chemistry Profile   Renal Lab Results  Component Value Date   BUN 10 12/13/2023   CREATININE 1.16 12/13/2023   GFR 71.51 12/13/2023   GFRAA >60 09/03/2018   GFRNONAA >60 06/11/2023    Hepatic Lab Results  Component Value Date   AST 44 (H) 12/13/2023   ALT 74 (H) 12/13/2023   ALBUMIN 4.8 12/13/2023   ALKPHOS 71 12/13/2023   LIPASE 10 (L) 04/30/2023    Electrolytes Lab Results  Component Value Date   NA 138 12/13/2023   K 4.9 12/13/2023   CL 100 12/13/2023   CALCIUM 9.7 12/13/2023    Bone No results found for: VD25OH, VD125OH2TOT, CI6874NY7, CI7874NY7, 25OHVITD1, 25OHVITD2, 25OHVITD3, TESTOFREE, TESTOSTERONE  Inflammation (CRP: Acute Phase) (ESR: Chronic Phase) Lab Results  Component Value Date   CRP 1.0 12/13/2023   ESRSEDRATE 5 12/13/2023   LATICACIDVEN 0.8 08/17/2011         Note: Above Lab results reviewed.    Assessment  The primary encounter diagnosis was Lumbar facet arthropathy. Diagnoses of Lumbar spondylosis and Chronic pain syndrome were also pertinent to this visit.  Plan of Care  The patient has undergone two positive diagnostic lumbar facet medial branch nerve blocks bilaterally at L3,L4, L5, each providing >=80% pain relief for the expected duration of action.  These were done July 21 & December 04, 2023. Given the reproducible and significant pain relief with these targeted blocks, we discussed proceeding with lumbar radiofrequency ablation (RFA) of the medial branch nerves to achieve longer-term pain reduction and functional improvement. The patient was counseled on the procedure, expected outcomes, and potential risks, and agrees with the  plan.   Orders:  Orders Placed This Encounter  Procedures   Radiofrequency,Lumbar    Standing Status:   Future    Expected Date:   01/10/2024    Expiration Date:   12/19/2024    Scheduling Instructions:     Side(s): Bilateral     Level(s): L3, L4, L5,Medial Branch Nerve(s)     Sedation: With Sedation     Scheduling Timeframe: As soon as pre-approved    Where will this procedure be performed?:   [facility]   Follow-up plan:   Return in about 3 weeks (around 01/10/2024) for B/L L3, 4, 5 RFA, ECT.      B/L L3-5 MBNB 10/02/23, 12/04/23    Recent Visits Date Type Provider Dept  12/04/23 Procedure visit Marcelino Nurse, MD [facility]  11/09/23 Office Visit Marcelino Nurse, MD [facility]  10/02/23 Procedure visit Marcelino Nurse, MD [facility]  Showing recent visits within past 90 days and meeting all other requirements Today's Visits Date Type Provider Dept  12/20/23 Office Visit Marcelino Nurse, MD [facility]  Showing today's visits and meeting all other requirements Future Appointments No visits were found meeting these conditions. Showing future appointments within next 90 days and meeting all other requirements  I discussed the assessment and treatment plan with the patient. The patient was provided an opportunity to ask questions and all were answered. The patient agreed with the plan and demonstrated an understanding of the instructions.  Patient advised to call back or seek an in-person evaluation if the symptoms or condition worsens.  Duration of encounter: .  Note by: Nurse Marcelino, MD Date: 12/20/2023; Time: 3:07 PM

## 2023-12-21 ENCOUNTER — Encounter: Payer: Self-pay | Admitting: Internal Medicine

## 2023-12-22 ENCOUNTER — Other Ambulatory Visit (INDEPENDENT_AMBULATORY_CARE_PROVIDER_SITE_OTHER)

## 2023-12-22 DIAGNOSIS — D582 Other hemoglobinopathies: Secondary | ICD-10-CM

## 2023-12-22 DIAGNOSIS — R7989 Other specified abnormal findings of blood chemistry: Secondary | ICD-10-CM

## 2023-12-22 LAB — HEPATIC FUNCTION PANEL
ALT: 60 U/L — ABNORMAL HIGH (ref 0–53)
AST: 36 U/L (ref 0–37)
Albumin: 4.7 g/dL (ref 3.5–5.2)
Alkaline Phosphatase: 68 U/L (ref 39–117)
Bilirubin, Direct: 0.1 mg/dL (ref 0.0–0.3)
Total Bilirubin: 0.7 mg/dL (ref 0.2–1.2)
Total Protein: 7.6 g/dL (ref 6.0–8.3)

## 2023-12-22 LAB — IBC + FERRITIN
Ferritin: 93.8 ng/mL (ref 22.0–322.0)
Iron: 134 ug/dL (ref 42–165)
Saturation Ratios: 36 % (ref 20.0–50.0)
TIBC: 372.4 ug/dL (ref 250.0–450.0)
Transferrin: 266 mg/dL (ref 212.0–360.0)

## 2023-12-22 LAB — IRON: Iron: 134 ug/dL (ref 42–165)

## 2023-12-26 ENCOUNTER — Ambulatory Visit: Admitting: Internal Medicine

## 2023-12-26 ENCOUNTER — Other Ambulatory Visit: Payer: Self-pay

## 2023-12-26 ENCOUNTER — Encounter: Payer: Self-pay | Admitting: Internal Medicine

## 2023-12-26 ENCOUNTER — Other Ambulatory Visit (INDEPENDENT_AMBULATORY_CARE_PROVIDER_SITE_OTHER)

## 2023-12-26 VITALS — BP 111/70 | HR 73 | Temp 97.4°F | Resp 19 | Ht 67.0 in | Wt 179.0 lb

## 2023-12-26 DIAGNOSIS — R7989 Other specified abnormal findings of blood chemistry: Secondary | ICD-10-CM | POA: Diagnosis not present

## 2023-12-26 DIAGNOSIS — K921 Melena: Secondary | ICD-10-CM | POA: Diagnosis not present

## 2023-12-26 DIAGNOSIS — K625 Hemorrhage of anus and rectum: Secondary | ICD-10-CM

## 2023-12-26 MED ORDER — SODIUM CHLORIDE 0.9 % IV SOLN: 500.0000 mL | Freq: Once | INTRAVENOUS | Status: AC

## 2023-12-26 NOTE — Progress Notes (Signed)
 Patient transported to lab for hep B serologies; lab notified and ready for the patient.

## 2023-12-26 NOTE — Progress Notes (Signed)
Pt. states no medical or surgical changes since previsit or office visit. 

## 2023-12-26 NOTE — Progress Notes (Signed)
 HISTORY OF PRESENT ILLNESS:  Bobby Mcbride is a 53 y.o. male seen in the office recently for rectal bleeding.  See that dictation.  Since the surgery because of a history of nonneoplastic nodule and rectal bleeding.  Sent back for colonoscopy.  Last colonoscopy 2 years ago with Dr. Teressa was negative for neoplasia  REVIEW OF SYSTEMS:  All non-GI ROS negative except for  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   followed by dr teressa   GERD (gastroesophageal reflux disease)    Headache(784.0)    Hemorrhoids    Hiatal hernia    History of echocardiogram    a. 07/2022 Echo: EF 60-65%, no rwma, nl RV size/fxn.  No significant valvular disease.   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 per pt dx age 68; b. 07/2022 Echo: No MVP/MR.   Perianal lesion    Pre-syncope    a. 07/2022 Zio x 2 (total 4 wks): 1.  Predominantly sinus rhythm with average heart rate of 64 (38-144).  Rare PACs and PVCs. 2.  Predominantly sinus rhythm at average of 58 (37-99).  Rare PACs and PVCs.    Past Surgical History:  Procedure Laterality Date   APPENDECTOMY  1990   BALLOON DILATION N/A 02/01/2023   Procedure: BALLOON DILATION;  Surgeon: Avram Lupita FORBES, MD;  Location: [facility] END[facility]COPY;  Service: Gastroenterology;  Laterality: N/A;   BIOPSY  09/03/2018   Procedure: BIOPSY;  Surgeon: Charlanne Groom, MD;  Location: [facility];  Service: Endoscopy;;   BIOPSY  02/01/2023   Procedure: BIOPSY;  Surgeon: Avram Lupita FORBES, MD;  Location: [facility] ENDOSCOPY;  Service: Gastroenterology;;   COLONOSCOPY WITH ESOPHAGOGASTRODUODENOSCOPY (EGD)  08/31/2021   by dr  teressa   ELBOW SURGERY Right 1987   ESOPHAGOGASTRODUODENOSCOPY  08/18/2011   Procedure: ESOPHAGOGASTRODUODENOSCOPY (EGD);  Surgeon: Gordy CHRISTELLA Starch, MD;  Location: [facility];  Service: Gastroenterology;  Laterality: N/A;   ESOPHAGOGASTRODUODENOSCOPY  11/11/2011   Procedure: ESOPHAGOGASTRODUODENOSCOPY (EGD);  Surgeon: Lupita FORBES Avram, MD;  Location: [facility];  Service: Endoscopy;  Laterality: N/A;   ESOPHAGOGASTRODUODENOSCOPY (EGD) WITH PROPOFOL  N/A 09/03/2018   Procedure: ESOPHAGOGASTRODUODENOSCOPY (EGD) WITH PROPOFOL ;  Surgeon: Charlanne Groom, MD;  Location: [facility];  Service: Endoscopy;  Laterality: N/A;   ESOPHAGOGASTRODUODENOSCOPY (EGD) WITH PROPOFOL  N/A 11/24/2021   Procedure: ESOPHAGOGASTRODUODENOSCOPY (EGD) WITH PROPOFOL ;  Surgeon: Abran Norleen SAILOR, MD;  Location: [facility];  Service: Gastroenterology;  Laterality: N/A;   ESOPHAGOGASTRODUODENOSCOPY (EGD) WITH PROPOFOL  N/A 02/01/2023   Procedure: ESOPHAGOGASTRODUODENOSCOPY (EGD) WITH PROPOFOL ;  Surgeon: Avram Lupita FORBES, MD;  Location: [facility] ENDOSCOPY;  Service: Gastroenterology;  Laterality: N/A;   KNEE ARTHROSCOPY Right 02/26/2009   @[facility]  by dr c. blackman   MASS EXCISION N/A 10/15/2021   Procedure: EXCISION OF PERIANAL MASS;  Surgeon: Teresa Lonni CHRISTELLA, MD;  Location: [facility] [place];  Service: General;  Laterality: N/A;   MULTIPLE TOOTH EXTRACTIONS     several ---  w/ IV sedation  last extraction 05/ 2023   RECTAL EXAM UNDER ANESTHESIA N/A 10/15/2021   Procedure: ANORECTAL EXAM UNDER ANESTHESIA;  Surgeon: Teresa Lonni HERO, MD;  Location: [facility];  Service: General;  Laterality: N/A;   SHOULDER ARTHROSCOPY W/ ROTATOR CUFF REPAIR Right 2012   SKIN BIOPSY N/A 10/15/2021   Procedure: BIOPSY OF GLUTEAL SKIN;  Surgeon: Teresa Lonni HERO, MD;  Location: [facility];  Service: General;  Laterality: N/A;    Social History Issacc Merlo Welshans  reports that he has never smoked. He has never used  smokeless tobacco. He reports that he does not drink alcohol and does not use drugs.  family history includes Asthma in his father; Colon cancer in his father; Colon polyps in his father; Diabetes in his mother; Heart attack in his father; Heart failure in his father; Hypertension in his father and mother; Liver disease in his mother; Migraines in his mother; Prostate cancer in his father; Stroke in his mother.  Allergies  Allergen Reactions   Codeine Other (See Comments)    Hallucinations   Food Nausea And Vomiting    Pepperoni-nausea/vomiting Lettuce-nausea/vomiting Oranges-nausea/vomiting Cheese-nausea/vomiting   Hydrocodone -Acetaminophen  Other (See Comments)    Hallucinations   Hydrocodone -Acetaminophen  Other (See Comments)    Hallucinations   Ibuprofen Other (See Comments)    ulcers in my stomach   Compazine  [Prochlorperazine  Edisylate] Anxiety       PHYSICAL EXAMINATION: Vital signs: BP 134/87   Pulse 73   Temp (!) 97.4 F (36.3 C) (Skin)   Resp 17   Ht 5' 7 (1.702 m)   Wt 179 lb (81.2 kg)   SpO2 98%   BMI 28.04 kg/m  General: Well-developed, well-nourished, no acute distress HEENT: Sclerae are anicteric, conjunctiva pink. Oral mucosa intact Lungs: Clear Heart: Regular Abdomen: soft, nontender, nondistended, no obvious ascites, no peritoneal signs, normal bowel sounds. No organomegaly. Extremities: No edema Psychiatric: alert and oriented x3. Cooperative     ASSESSMENT:   Rectal bleeding  PLAN:  Colonoscopy

## 2023-12-26 NOTE — Progress Notes (Signed)
 Report to PACU, RN, vss, BBS= Clear.

## 2023-12-26 NOTE — Patient Instructions (Signed)
YOU HAD AN ENDOSCOPIC PROCEDURE TODAY AT THE [facility]:   Refer to the procedure report that was given to you for any specific questions about what was found during the examination.  If the procedure report does not answer your questions, please call your gastroenterologist to clarify.  If you requested that your care partner not be given the details of your procedure findings, then the procedure report has been included in a sealed envelope for you to review at your convenience later.  YOU SHOULD EXPECT: Some feelings of bloating in the abdomen. Passage of more gas than usual.  Walking can help get rid of the air that was put into your GI tract during the procedure and reduce the bloating. If you had a lower endoscopy (such as a colonoscopy or flexible sigmoidoscopy) you may notice spotting of blood in your stool or on the toilet paper. If you underwent a bowel prep for your procedure, you may not have a normal bowel movement for a few days.  Please Note:  You might notice some irritation and congestion in your nose or some drainage.  This is from the oxygen used during your procedure.  There is no need for concern and it should clear up in a day or so.  SYMPTOMS TO REPORT IMMEDIATELY:  Following lower endoscopy (colonoscopy or flexible sigmoidoscopy):  Excessive amounts of blood in the stool  Significant tenderness or worsening of abdominal pains  Swelling of the abdomen that is new, acute  Fever of 100F or higher   For urgent or emergent issues, a gastroenterologist can be reached at any hour by calling [phone]. Do not use MyChart messaging for urgent concerns.    DIET:  We do recommend a small meal at first, but then you may proceed to your regular diet.  Drink plenty of fluids but you should avoid alcoholic beverages for 24 hours.  MEDICATIONS: Continue present medications.  FOLLOW UP: Repeat colonoscopy in 10 years for surveillance.  Thank you for allowing us to  provide for your healthcare needs today.  ACTIVITY:  You should plan to take it easy for the rest of today and you should NOT DRIVE or use heavy machinery until tomorrow (because of the sedation medicines used during the test).    FOLLOW UP: Our staff will call the number listed on your records the next business day following your procedure.  We will call around 7:15- 8:00 am to check on you and address any questions or concerns that you may have regarding the information given to you following your procedure. If we do not reach you, we will leave a message.     If any biopsies were taken you will be contacted by phone or by letter within the next 1-3 weeks.  Please call us at [phone] if you have not heard about the biopsies in 3 weeks.    SIGNATURES/CONFIDENTIALITY: You and/or your care partner have signed paperwork which will be entered into your electronic medical record.  These signatures attest to the fact that that the information above on your After Visit Summary has been reviewed and is understood.  Full responsibility of the confidentiality of this discharge information lies with you and/or your care-partner. 

## 2023-12-26 NOTE — Op Note (Signed)
 [facility] Patient Name: Bobby Mcbride Procedure Date: 12/26/2023 10:55 AM MRN: [id number] Endoscopist: Norleen SAILOR. Abran , MD, [id number] Age: 53 Referring MD:  Date of Birth: [birth year] Gender: Male Account #: [id number] Procedure:                Colonoscopy Indications:              Rectal bleeding Medicines:                Monitored Anesthesia Care. Previous colonoscopy                            June 2023 was normal Procedure:                Pre-Anesthesia Assessment:                           - Prior to the procedure, a History and Physical                            was performed, and patient medications and                            allergies were reviewed. The patient's tolerance of                            previous anesthesia was also reviewed. The risks                            and benefits of the procedure and the sedation                            options and risks were discussed with the patient.                            All questions were answered, and informed consent                            was obtained. Prior Anticoagulants: The patient has                            taken no anticoagulant or antiplatelet agents. ASA                            Grade Assessment: II - A patient with mild systemic                            disease. After reviewing the risks and benefits,                            the patient was deemed in satisfactory condition to                            undergo the procedure.  After obtaining informed consent, the colonoscope                            was passed under direct vision. Throughout the                            procedure, the patient's blood pressure, pulse, and                            oxygen saturations were monitored continuously. The                            CF [id number] #[id number] was introduced through the anus                            and advanced to the the cecum, identified by                             appendiceal orifice and ileocecal valve. The                            ileocecal valve, appendiceal orifice, and rectum                            were photographed. The quality of the bowel                            preparation was excellent. The colonoscopy was                            performed without difficulty. The patient tolerated                            the procedure well. The bowel preparation used was                            SUPREP via split dose instruction. Scope In: 11:13:31 AM Scope Out: 11:23:18 AM Scope Withdrawal Time: 0 hours 7 minutes 10 seconds  Total Procedure Duration: 0 hours 9 minutes 47 seconds  Findings:                 The entire examined colon appeared normal on direct                            and retroflexion views.                           The entire examined colon appeared normal on direct                            and retroflexion views. Complications:            No immediate complications. Estimated blood loss:  None. Estimated Blood Loss:     Estimated blood loss: none. Impression:               - The entire examined colon is normal on direct and                            retroflexion views.                           - No specimens collected. Recommendation:           - Repeat colonoscopy in 10 years for surveillance.                           - Patient has a contact number available for                            emergencies. The signs and symptoms of potential                            delayed complications were discussed with the                            patient. Return to normal activities tomorrow.                            Written discharge instructions were provided to the                            patient.                           - Resume previous diet.                           - Continue present medications. Norleen SAILOR. Abran, MD 12/26/2023 11:28:35 AM This report has been  signed electronically.

## 2023-12-27 ENCOUNTER — Telehealth: Payer: Self-pay

## 2023-12-27 ENCOUNTER — Ambulatory Visit: Payer: Self-pay | Admitting: Internal Medicine

## 2023-12-27 LAB — HEPATITIS C ANTIBODY: Hepatitis C Ab: NONREACTIVE

## 2023-12-27 LAB — HEPATITIS B SURFACE ANTIBODY,QUALITATIVE: Hep B S Ab: NONREACTIVE

## 2023-12-27 LAB — HEPATITIS B SURFACE ANTIGEN: Hepatitis B Surface Ag: NONREACTIVE

## 2023-12-27 NOTE — Telephone Encounter (Signed)
  Follow up Call-     12/26/2023    9:41 AM 11/07/2023    2:16 PM 05/31/2023    1:14 PM 05/20/2022   10:00 AM 09/01/2021    2:44 PM 05/11/2021    9:03 AM  Call back number  Post procedure Call Back phone  # [phone]  Permission to leave phone message Yes Yes Yes Yes Yes Yes     Patient questions:  Do you have a fever, pain , or abdominal swelling? No. Pain Score  0 *  Have you tolerated food without any problems? Yes.    Have you been able to return to your normal activities? Yes.    Do you have any questions about your discharge instructions: Diet   No. Medications  No. Follow up visit  No.  Do you have questions or concerns about your Care? No.  Actions: * If pain score is 4 or above: No action needed, pain <4.

## 2023-12-28 ENCOUNTER — Ambulatory Visit: Payer: Self-pay | Admitting: Nurse Practitioner

## 2023-12-28 DIAGNOSIS — R7989 Other specified abnormal findings of blood chemistry: Secondary | ICD-10-CM

## 2024-01-07 ENCOUNTER — Emergency Department (HOSPITAL_BASED_OUTPATIENT_CLINIC_OR_DEPARTMENT_OTHER)

## 2024-01-07 ENCOUNTER — Emergency Department (HOSPITAL_BASED_OUTPATIENT_CLINIC_OR_DEPARTMENT_OTHER)
Admission: EM | Admit: 2024-01-07 | Discharge: 2024-01-07 | Disposition: A | Discharge status: Home / Self Care | Attending: Emergency Medicine | Admitting: Emergency Medicine

## 2024-01-07 ENCOUNTER — Other Ambulatory Visit: Payer: Self-pay

## 2024-01-07 ENCOUNTER — Encounter (HOSPITAL_BASED_OUTPATIENT_CLINIC_OR_DEPARTMENT_OTHER): Payer: Self-pay | Admitting: Emergency Medicine

## 2024-01-07 DIAGNOSIS — R059 Cough, unspecified: Secondary | ICD-10-CM | POA: Diagnosis not present

## 2024-01-07 DIAGNOSIS — R519 Headache, unspecified: Secondary | ICD-10-CM | POA: Diagnosis not present

## 2024-01-07 DIAGNOSIS — J069 Acute upper respiratory infection, unspecified: Secondary | ICD-10-CM

## 2024-01-07 LAB — RESP PANEL BY RT-PCR (RSV, FLU A&B, COVID)  RVPGX2
Influenza A by PCR: NEGATIVE
Influenza B by PCR: NEGATIVE
Resp Syncytial Virus by PCR: NEGATIVE
SARS Coronavirus 2 by RT PCR: NEGATIVE

## 2024-01-07 MED ORDER — BENZONATATE 100 MG PO CAPS: 100.0000 mg | ORAL_CAPSULE | Freq: Three times a day (TID) | ORAL | 0 refills | Status: AC

## 2024-01-07 MED ORDER — IPRATROPIUM-ALBUTEROL 0.5-2.5 (3) MG/3ML IN SOLN
3.0000 mL | Freq: Once | RESPIRATORY_TRACT | Status: AC
  Administered 2024-01-07: 3 mL via RESPIRATORY_TRACT

## 2024-01-07 MED ORDER — IPRATROPIUM-ALBUTEROL 0.5-2.5 (3) MG/3ML IN SOLN
RESPIRATORY_TRACT | Status: AC
  Filled 2024-01-07: qty 3

## 2024-01-07 NOTE — Discharge Instructions (Signed)
 Begin taking Tessalon  as prescribed as needed for cough.  Take over-the-counter medications as needed for relief of symptoms.  Follow-up with primary doctor if not improving in the next few days, and return to the ER if symptoms significantly worsen or change.

## 2024-01-07 NOTE — ED Provider Notes (Signed)
 [place] EMERGENCY DEPARTMENT AT [facility] Provider Note   CSN: [id number] Arrival date & time: 01/07/24  9182     Patient presents with: Cough   Bobby Mcbride is a 53 y.o. male.   Patient is a 53 year old male with past medical history of GERD, esophageal stricture, anxiety.  Patient presenting today with complaints of cough.  He began feeling poorly 3 days ago and has been coughing persistently.  Cough has been nonproductive.  He denies chills, but reports low-grade fever at home.  No ill contacts.  He did perform a COVID test at home that was negative.       Prior to Admission medications   Medication Sig Start Date End Date Taking? Authorizing Provider  AMBULATORY NON FORMULARY MEDICATION Medication Name: Medication Name: Diltiazem 2% gel/Lidocaine  5% Apply a small amount to the inside and external rectum area 3 times a day for 6 weeks. Patient not taking: Reported on 12/20/2023 12/13/23   Kennedy-Smith, Colleen M, NP  celecoxib  (CELEBREX ) 100 MG capsule Take 100 mg by mouth 2 (two) times daily. 11/10/23   [provider]  diclofenac  (VOLTAREN ) 75 MG EC tablet Take 1 tablet (75 mg total) by mouth 2 (two) times daily. Patient not taking: Reported on 12/26/2023 11/09/23   Marcelino Nurse, MD  hydrocortisone  2.5 % cream Apply topically 2 (two) times daily. Patient not taking: Reported on 12/26/2023 10/13/23   Crandall Lynwood HERO, NP  mupirocin  ointment (BACTROBAN ) 2 % Apply 1 Application topically 2 (two) times daily. Patient not taking: Reported on 12/26/2023 10/05/23   Crandall Lynwood HERO, NP  nystatin cream (MYCOSTATIN) Apply 1 Application topically 3 (three) times daily. For 3 weeks to the external perianal area Patient not taking: Reported on 12/26/2023 12/13/23   Cara Elida HERO, NP  pantoprazole  (PROTONIX ) 40 MG tablet Take 1 tablet (40 mg total) by mouth 2 (two) times daily before a meal. 10/25/23   Abran Norleen SAILOR, MD    Allergies: Codeine, Food,  Hydrocodone -acetaminophen , Hydrocodone -acetaminophen , Ibuprofen, and Compazine  [prochlorperazine  edisylate]    Review of Systems  All other systems reviewed and are negative.   Updated Vital Signs BP (!) 165/86   Pulse (!) 101   Temp 98.6 F (37 C) (Oral)   Resp 18   SpO2 98%   Physical Exam Vitals and nursing note reviewed.  Constitutional:      General: He is not in acute distress.    Appearance: He is well-developed. He is not diaphoretic.  HENT:     Head: Normocephalic and atraumatic.  Cardiovascular:     Rate and Rhythm: Normal rate and regular rhythm.     Heart sounds: No murmur heard.    No friction rub.  Pulmonary:     Effort: Pulmonary effort is normal. No respiratory distress.     Breath sounds: Normal breath sounds. No wheezing or rales.  Abdominal:     General: Bowel sounds are normal. There is no distension.     Palpations: Abdomen is soft.     Tenderness: There is no abdominal tenderness.  Musculoskeletal:        General: Normal range of motion.     Cervical back: Normal range of motion and neck supple.  Skin:    General: Skin is warm and dry.  Neurological:     Mental Status: He is alert and oriented to person, place, and time.     Coordination: Coordination normal.     (all labs ordered are listed, but only abnormal  results are displayed) Labs Reviewed  RESP PANEL BY RT-PCR (RSV, FLU A&B, COVID)  [id number]    EKG: None  Radiology: No results found.   Procedures   Medications Ordered in the ED  ipratropium-albuterol  (DUONEB) 0.5-2.5 (3) MG/3ML nebulizer solution (  Not Given 01/07/24 0832)  ipratropium-albuterol  (DUONEB) 0.5-2.5 (3) MG/3ML nebulizer solution 3 mL (3 mLs Nebulization Given 01/07/24 9170)                                    Medical Decision Making Amount and/or Complexity of Data Reviewed Radiology: ordered.  Risk Prescription drug management.   COVID/flu/RSV all negative.  Chest x-ray is clear.  Suspect a viral  etiology.  Patient to be treated with cough medication and continued use of over-the-counter medications.  Work excuse given for the next 2 days.     Final diagnoses:  None    ED Discharge Orders     None          Geroldine Berg, MD 01/07/24 [phone]

## 2024-01-07 NOTE — ED Triage Notes (Signed)
 Reports cough, chills, and body aches since Friday. SHOB when coughing

## 2024-01-15 ENCOUNTER — Ambulatory Visit
Admission: RE | Admit: 2024-01-15 | Discharge: 2024-01-15 | Disposition: A | Discharge status: Home / Self Care | Source: Ambulatory Visit | Attending: Student in an Organized Health Care Education/Training Program | Admitting: Student in an Organized Health Care Education/Training Program

## 2024-01-15 ENCOUNTER — Ambulatory Visit: Admitting: Student in an Organized Health Care Education/Training Program

## 2024-01-15 ENCOUNTER — Encounter: Payer: Self-pay | Admitting: Student in an Organized Health Care Education/Training Program

## 2024-01-15 DIAGNOSIS — M47816 Spondylosis without myelopathy or radiculopathy, lumbar region: Secondary | ICD-10-CM | POA: Diagnosis not present

## 2024-01-15 DIAGNOSIS — G894 Chronic pain syndrome: Secondary | ICD-10-CM | POA: Insufficient documentation

## 2024-01-15 MED ORDER — DEXAMETHASONE SOD PHOSPHATE PF 10 MG/ML IJ SOLN
20.0000 mg | Freq: Once | INTRAMUSCULAR | Status: AC
  Administered 2024-01-15: 20 mg

## 2024-01-15 MED ORDER — LACTATED RINGERS IV SOLN: Freq: Once | INTRAVENOUS | Status: AC

## 2024-01-15 MED ORDER — LIDOCAINE HCL 2 % IJ SOLN
20.0000 mL | Freq: Once | INTRAMUSCULAR | Status: AC
  Administered 2024-01-15: 400 mg

## 2024-01-15 MED ORDER — MIDAZOLAM HCL 5 MG/5ML IJ SOLN
0.5000 mg | Freq: Once | INTRAMUSCULAR | Status: AC
  Administered 2024-01-15: 2 mg via INTRAVENOUS

## 2024-01-15 MED ORDER — ROPIVACAINE HCL 2 MG/ML IJ SOLN
INTRAMUSCULAR | Status: AC
  Filled 2024-01-15: qty 20

## 2024-01-15 MED ORDER — LIDOCAINE HCL 2 % IJ SOLN
INTRAMUSCULAR | Status: AC
  Filled 2024-01-15: qty 20

## 2024-01-15 MED ORDER — MIDAZOLAM HCL 5 MG/5ML IJ SOLN
INTRAMUSCULAR | Status: AC
  Filled 2024-01-15: qty 5

## 2024-01-15 MED ORDER — ROPIVACAINE HCL 2 MG/ML IJ SOLN
18.0000 mL | Freq: Once | INTRAMUSCULAR | Status: AC
  Administered 2024-01-15: 18 mL via PERINEURAL

## 2024-01-15 MED ORDER — FENTANYL CITRATE (PF) 100 MCG/2ML IJ SOLN
INTRAMUSCULAR | Status: AC
  Filled 2024-01-15: qty 2

## 2024-01-15 MED ORDER — FENTANYL CITRATE (PF) 100 MCG/2ML IJ SOLN
25.0000 ug | INTRAMUSCULAR | Status: DC | PRN
  Administered 2024-01-15: 75 ug via INTRAVENOUS

## 2024-01-15 NOTE — Progress Notes (Signed)
 PROVIDER NOTE: Interpretation of information contained herein should be left to medically-trained personnel. Specific patient instructions are provided elsewhere under Patient Instructions section of medical record. This document was created in part using STT-dictation technology, any transcriptional errors that may result from this process are unintentional.  Patient: Bobby Mcbride Type: Established DOB: [birth year] MRN: [id number] PCP: Crandall Lynwood HERO, NP  Service: Procedure DOS: 01/15/2024 Setting: Ambulatory Location: Ambulatory outpatient facility Delivery: Face-to-face Provider: Wallie Sherry, MD Specialty: Interventional Pain Management Specialty designation: 09 Location: Outpatient facility Ref. Prov.: Sherry Wallie, MD       Interventional Therapy   Procedure: Lumbar Facet, Medial Branch Radiofrequency Ablation (RFA) #1  Laterality: Bilateral (-50)  Level: L3, L4, and L5 Medial Branch Level(s). These levels will denervate the L3-4 and L4-5 lumbar facet joints.  Imaging: Fluoroscopy-guided         Anesthesia: Local anesthesia (1-2% Lidocaine ) Sedation: Moderate Sedation                       DOS: 01/15/2024  Performed by: Wallie Sherry, MD  Purpose: Therapeutic/Palliative Indications: Low back pain severe enough to impact quality of life or function. Indications: 1. Lumbar facet arthropathy   2. Lumbar spondylosis   3. Chronic pain syndrome    Bobby Mcbride has been dealing with the above chronic pain for longer than three months and has either failed to respond, was unable to tolerate, or simply did not get enough benefit from other more conservative therapies including, but not limited to: 1. Over-the-counter medications 2. Anti-inflammatory medications 3. Muscle relaxants 4. Membrane stabilizers 5. Opioids 6. Physical therapy and/or chiropractic manipulation 7. Modalities (Heat, ice, etc.) 8. Invasive techniques such as nerve blocks. [name] has attained more than 50%  relief of the pain from a series of diagnostic injections conducted in separate occasions.  Pain Score: Pre-procedure: 7 /10 Post-procedure: 0-No pain/10     Position / Prep / Materials:  Position: Prone  Prep solution: ChloraPrep (2% chlorhexidine gluconate and 70% isopropyl alcohol) Prep Area: Entire Lumbosacral Region (Lower back from mid-thoracic region to end of tailbone and from flank to flank.) Materials:  Tray: RFA (Radiofrequency) tray Needle(s):  Type: RFA (Teflon-coated radiofrequency ablation needles) Gauge (G): 20  Length: Regular (10cm) Qty: 6     H&P (Pre-op Assessment):  Mr. Michaux is a 53 y.o. (year old), male patient, seen today for interventional treatment. He  has a past surgical history that includes Elbow surgery (Right, 1987); Appendectomy (1990); Knee arthroscopy (Right, 02/26/2009); Shoulder arthroscopy w/ rotator cuff repair (Right, 2012); Esophagogastroduodenoscopy (08/18/2011); Esophagogastroduodenoscopy (11/11/2011); Esophagogastroduodenoscopy (egd) with propofol  (N/A, 09/03/2018); biopsy (09/03/2018); Colonoscopy with esophagogastroduodenoscopy (egd) (08/31/2021); Multiple tooth extractions; Mass excision (N/A, 10/15/2021); Skin biopsy (N/A, 10/15/2021); Rectal exam under anesthesia (N/A, 10/15/2021); Esophagogastroduodenoscopy (egd) with propofol  (N/A, 11/24/2021); Esophagogastroduodenoscopy (egd) with propofol  (N/A, 02/01/2023); biopsy (02/01/2023); and Balloon dilation (N/A, 02/01/2023). Bobby Mcbride has a current medication list which includes the following prescription(s): AMBULATORY NON FORMULARY MEDICATION, benzonatate , celecoxib , diclofenac , hydrocortisone , mupirocin  ointment, nystatin cream, and pantoprazole , and the following Facility-Administered Medications: sodium chloride , fentanyl , and lactated ringers . His primarily concern today is the Back Pain  Initial Vital Signs:  Pulse/HCG Rate: 67ECG Heart Rate: 65 (nsr) Temp: (!) 97.2 F (36.2 C) Resp: 16 BP: (!)  134/93 SpO2: 99 %  BMI: Estimated body mass index is 26.94 kg/m as calculated from the following:   Height as of this encounter: 5' 7 (1.702 m).   Weight as of this encounter: 172 lb (  78 kg).  Risk Assessment: Allergies: Reviewed. He is allergic to codeine, food, hydrocodone -acetaminophen , hydrocodone -acetaminophen , ibuprofen, and compazine  [prochlorperazine  edisylate].  Allergy Precautions: None required Coagulopathies: Reviewed. None identified.  Blood-thinner therapy: None at this time Active Infection(s): Reviewed. None identified. [name] is afebrile  Site Confirmation: Bobby Mcbride was asked to confirm the procedure and laterality before marking the site Procedure checklist: Completed Consent: Before the procedure and under the influence of no sedative(s), amnesic(s), or anxiolytics, the patient was informed of the treatment options, risks and possible complications. To fulfill our ethical and legal obligations, as recommended by the American Medical Association's Code of Ethics, I have informed the patient of my clinical impression; the nature and purpose of the treatment or procedure; the risks, benefits, and possible complications of the intervention; the alternatives, including doing nothing; the risk(s) and benefit(s) of the alternative treatment(s) or procedure(s); and the risk(s) and benefit(s) of doing nothing. The patient was provided information about the general risks and possible complications associated with the procedure. These may include, but are not limited to: failure to achieve desired goals, infection, bleeding, organ or nerve damage, allergic reactions, paralysis, and death. In addition, the patient was informed of those risks and complications associated to Spine-related procedures, such as failure to decrease pain; infection (i.e.: Meningitis, epidural or intraspinal abscess); bleeding (i.e.: epidural hematoma, subarachnoid hemorrhage, or any other type of intraspinal  or peri-dural bleeding); organ or nerve damage (i.e.: Any type of peripheral nerve, nerve root, or spinal cord injury) with subsequent damage to sensory, motor, and/or autonomic systems, resulting in permanent pain, numbness, and/or weakness of one or several areas of the body; allergic reactions; (i.e.: anaphylactic reaction); and/or death. Furthermore, the patient was informed of those risks and complications associated with the medications. These include, but are not limited to: allergic reactions (i.e.: anaphylactic or anaphylactoid reaction(s)); adrenal axis suppression; blood sugar elevation that in diabetics may result in ketoacidosis or comma; water retention that in patients with history of congestive heart failure may result in shortness of breath, pulmonary edema, and decompensation with resultant heart failure; weight gain; swelling or edema; medication-induced neural toxicity; particulate matter embolism and blood vessel occlusion with resultant organ, and/or nervous system infarction; and/or aseptic necrosis of one or more joints. Finally, the patient was informed that Medicine is not an exact science; therefore, there is also the possibility of unforeseen or unpredictable risks and/or possible complications that may result in a catastrophic outcome. The patient indicated having understood very clearly. We have given the patient no guarantees and we have made no promises. Enough time was given to the patient to ask questions, all of which were answered to the patient's satisfaction. Mr. Dorner has indicated that he wanted to continue with the procedure. Attestation: I, the ordering provider, attest that I have discussed with the patient the benefits, risks, side-effects, alternatives, likelihood of achieving goals, and potential problems during recovery for the procedure that I have provided informed consent. Date  Time: 01/15/2024  7:57 AM  Pre-Procedure Preparation:  Monitoring: As per clinic  protocol. Respiration, ETCO2, SpO2, BP, heart rate and rhythm monitor placed and checked for adequate function Safety Precautions: Patient was assessed for positional comfort and pressure points before starting the procedure. Time-out: I initiated and conducted the Time-out before starting the procedure, as per protocol. The patient was asked to participate by confirming the accuracy of the Time Out information. Verification of the correct person, site, and procedure were performed and confirmed by me, the nursing staff,  and the patient. Time-out conducted as per Joint Commission's Universal Protocol (UP.01.01.01). Time: 0915 Start Time: 0915 hrs.  Description of Procedure:          Laterality: See above. Levels:  See above. Safety Precautions: Aspiration looking for blood return was conducted prior to all injections. At no point did we inject any substances, as a needle was being advanced. Before injecting, the patient was told to immediately notify me if he was experiencing any new onset of ringing in the ears, or metallic taste in the mouth. No attempts were made at seeking any paresthesias. Safe injection practices and needle disposal techniques used. Medications properly checked for expiration dates. SDV (single dose vial) medications used. After the completion of the procedure, all disposable equipment used was discarded in the proper designated medical waste containers. Local Anesthesia: Protocol guidelines were followed. The patient was positioned over the fluoroscopy table. The area was prepped in the usual manner. The time-out was completed. The target area was identified using fluoroscopy. A 12-in long, straight, sterile hemostat was used with fluoroscopic guidance to locate the targets for each level blocked. Once located, the skin was marked with an approved surgical skin marker. Once all sites were marked, the skin (epidermis, dermis, and hypodermis), as well as deeper tissues (fat,  connective tissue and muscle) were infiltrated with a small amount of a short-acting local anesthetic, loaded on a 10cc syringe with a 25G, 1.5-in  Needle. An appropriate amount of time was allowed for local anesthetics to take effect before proceeding to the next step. Technical description of process:  Radiofrequency Ablation (RFA) L3 Medial Branch Nerve RFA: The target area for the L3 medial branch is at the junction of the postero-lateral aspect of the superior articular process and the superior, posterior, and medial edge of the transverse process of L4. Under fluoroscopic guidance, a Radiofrequency needle was inserted until contact was made with os over the superior postero-lateral aspect of the pedicular shadow (target area). Sensory and motor testing was conducted to properly adjust the position of the needle. Once satisfactory placement of the needle was achieved, the numbing solution was slowly injected after negative aspiration for blood. 2.0 mL of the nerve block solution was injected without difficulty or complication. After waiting for at least 3 minutes, the ablation was performed. Once completed, the needle was removed intact. L4 Medial Branch Nerve RFA: The target area for the L4 medial branch is at the junction of the postero-lateral aspect of the superior articular process and the superior, posterior, and medial edge of the transverse process of L5. Under fluoroscopic guidance, a Radiofrequency needle was inserted until contact was made with os over the superior postero-lateral aspect of the pedicular shadow (target area). Sensory and motor testing was conducted to properly adjust the position of the needle. Once satisfactory placement of the needle was achieved, the numbing solution was slowly injected after negative aspiration for blood. 2.0 mL of the nerve block solution was injected without difficulty or complication. After waiting for at least 3 minutes, the ablation was performed. Once  completed, the needle was removed intact. L5 Medial Branch Nerve RFA: The target area for the L5 medial branch is at the junction of the postero-lateral aspect of the superior articular process of S1 and the superior, posterior, and medial edge of the sacral ala. Under fluoroscopic guidance, a Radiofrequency needle was inserted until contact was made with os over the superior postero-lateral aspect of the pedicular shadow (target area). Sensory and motor testing  was conducted to properly adjust the position of the needle. Once satisfactory placement of the needle was achieved, the numbing solution was slowly injected after negative aspiration for blood. 2.0 mL of the nerve block solution was injected without difficulty or complication. After waiting for at least 3 minutes, the ablation was performed. Once completed, the needle was removed intact. Radiofrequency Generator: Medtronic AccurianTM AG 1000 RF Generator Sensory Stimulation Parameters: 50 Hz was used to locate & identify the nerve, making sure that the needle was positioned such that there was no sensory stimulation below 0.3 V or above 0.7 V. Motor Stimulation Parameters: 2 Hz was used to evaluate the motor component. Care was taken not to lesion any nerves that demonstrated motor stimulation of the lower extremities at an output of less than 2.5 times that of the sensory threshold, or a maximum of 2.0 V. Lesioning Technique Parameters: Standard Radiofrequency settings. (Not bipolar or pulsed.) Temperature Settings: 80 degrees C Lesioning time: 60 seconds Stationary intra-operative compliance: Compliant  Once the entire procedure was completed, the treated area was cleaned, making sure to leave some of the prepping solution back to take advantage of its long term bactericidal properties.    Illustration of the posterior view of the lumbar spine and the posterior neural structures. Laminae of L2 through S1 are labeled. DPRL5, dorsal primary  ramus of L5; DPRS1, dorsal primary ramus of S1; DPR3, dorsal primary ramus of L3; FJ, facet (zygapophyseal) joint L3-L4; I, inferior articular process of L4; LB1, lateral branch of dorsal primary ramus of L1; IAB, inferior articular branches from L3 medial branch (supplies L4-L5 facet joint); IBP, intermediate branch plexus; MB3, medial branch of dorsal primary ramus of L3; NR3, third lumbar nerve root; S, superior articular process of L5; SAB, superior articular branches from L4 (supplies L4-5 facet joint also); TP3, transverse process of L3.  Facet Joint Innervation (* possible contribution)  L1-2 T12, L1 (L2*)  Medial Branch  L2-3 L1, L2 (L3*)                     L3-4 L2, L3 (L4*)                     L4-5 L3, L4 (L5*)                     L5-S1 L4, L5, S1                        Vitals:   01/15/24 0920 01/15/24 0925 01/15/24 0932 01/15/24 0940  BP: (!) 202/184 (!) 144/77 106/84 136/79  Pulse:      Resp: 18 17 15 16   Temp:    98.7 F (37.1 C)  SpO2: 100% 100% 100% 98%  Weight:      Height:        Start Time: 0915 hrs. End Time: 0932 hrs.  Imaging Guidance (Spinal):         Type of Imaging Technique: Fluoroscopy Guidance (Spinal) Indication(s): Fluoroscopy guidance for needle placement to enhance accuracy in procedures requiring precise needle localization for targeted delivery of medication in or near specific anatomical locations not easily accessible without such real-time imaging assistance. Exposure Time: Please see nurses notes. Contrast: None used. Fluoroscopic Guidance: I was personally present during the use of fluoroscopy. Tunnel Vision Technique used to obtain the best possible view of the target area. Parallax error corrected before commencing the procedure. Direction-depth-direction technique used to  introduce the needle under continuous pulsed fluoroscopy. Once target was reached, antero-posterior, oblique, and lateral fluoroscopic projection used confirm needle  placement in all planes. Images permanently stored in EMR. Interpretation: No contrast injected. I personally interpreted the imaging intraoperatively. Adequate needle placement confirmed in multiple planes. Permanent images saved into the patient's record.  Antibiotic Prophylaxis:   Anti-infectives (From admission, onward)    None      Indication(s): None identified  Post-operative Assessment:  Post-procedure Vital Signs:  Pulse/HCG Rate: 6766 Temp: 98.7 F (37.1 C) Resp: 16 BP: 136/79 SpO2: 98 %  EBL: None  Complications: No immediate post-treatment complications observed by team, or reported by patient.  Note: The patient tolerated the entire procedure well. A repeat set of vitals were taken after the procedure and the patient was kept under observation following institutional policy, for this type of procedure. Post-procedural neurological assessment was performed, showing return to baseline, prior to discharge. The patient was provided with post-procedure discharge instructions, including a section on how to identify potential problems. Should any problems arise concerning this procedure, the patient was given instructions to immediately contact us , at any time, without hesitation. In any case, we plan to contact the patient by telephone for a follow-up status report regarding this interventional procedure.  Comments:  No additional relevant information.  Plan of Care (POC)  Orders:  Orders Placed This Encounter  Procedures   DG PAIN CLINIC C-ARM 1-60 MIN NO REPORT    Intraoperative interpretation by procedural physician at [facility].    Standing Status:   Standing    Number of Occurrences:   1    Reason for exam::   Assistance in needle guidance and placement for procedures requiring needle placement in or near specific anatomical locations not easily accessible without such assistance.    T12-L1: Only imaged in the sagittal plane. No evidence of  high-grade spinal canal or neural foraminal stenosis.   L1-L2: Mild bilateral facet degenerative change. No significant disc bulge. No spinal canal narrowing. No neural foraminal narrowing.   L2-L3: Mild bilateral facet degenerative change. No significant disc bulge. No spinal canal narrowing. Mild left neural foraminal narrowing.   L3-L4: Mild bilateral facet degenerative change. No significant disc bulge. No spinal canal narrowing. No neural foraminal narrowing.   L4-L5: Mild bilateral facet degenerative change. No significant disc bulge. No spinal canal narrowing. No significant neural foraminal narrowing.   L5-S1: Moderate right facet degenerative change. Mild spinal canal narrowing. No neural foraminal narrowing.   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   1. No acute osseous abnormality. 2. No significant change from prior exam. No evidence of high grade spinal canal or neuroforaminal narrowing.     Electronically Signed By: Lyndall Gore M.D. On: 12/21/2022 07:46 Note: Reviewed       Medications ordered for procedure: Meds ordered this encounter  Medications   lidocaine  (XYLOCAINE ) 2 % (with pres) injection 400 mg   lactated ringers  infusion   midazolam  (VERSED ) 5 MG/5ML injection 0.5-2 mg    Make sure Flumazenil is available in the pyxis when using this medication. If oversedation occurs, administer 0.2 mg IV over 15 sec. If after 45 sec no response, administer 0.2 mg again over 1 min; may repeat at 1 min intervals; not to exceed 4 doses (1 mg)   fentaNYL  (SUBLIMAZE ) injection 25-50 mcg    Make sure Narcan is available in the pyxis when using  this medication. In the event of respiratory depression (RR< 8/min): Titrate NARCAN (naloxone) in increments of 0.1 to 0.2 mg IV at 2-3 minute intervals, until desired degree of reversal.   ropivacaine  (PF) 2 mg/mL (0.2%) (NAROPIN ) injection 18  mL   dexamethasone  (DECADRON ) injection 20 mg   Medications administered: We administered lidocaine , lactated ringers , midazolam , fentaNYL , ropivacaine  (PF) 2 mg/mL (0.2%), and dexamethasone .  See the medical record for exact dosing, route, and time of administration.    B/L L3-5 MBNB 10/02/23, 12/04/23    Follow-up plan:   Return in about 6 weeks (around 02/26/2024) for PPE, F2F, Seema.     Recent Visits Date Type Provider Dept  12/20/23 Office Visit Marcelino Nurse, MD [facility]  12/04/23 Procedure visit Marcelino Nurse, MD [facility]  11/09/23 Office Visit Marcelino Nurse, MD [facility]  Showing recent visits within past 90 days and meeting all other requirements Today's Visits Date Type Provider Dept  01/15/24 Procedure visit Marcelino Nurse, MD [facility]  Showing today's visits and meeting all other requirements Future Appointments Date Type Provider Dept  02/22/24 Appointment Patel, Seema K, NP [facility]  Showing future appointments within next 90 days and meeting all other requirements   Disposition: Discharge home  Discharge (Date  Time): 01/15/2024;   hrs.   Primary Care Physician: Wendee Lynwood HERO, NP Location: [facility] Note by: Nurse Marcelino, MD (TTS technology used. I apologize for any typographical errors that were not detected and corrected.) Date: 01/15/2024; Time: 9:47 AM  Disclaimer:  Medicine is not an visual merchandiser. The only guarantee in medicine is that nothing is guaranteed. It is important to note that the decision to proceed with this intervention was based on the information collected from the patient. The Data and conclusions were drawn from the patient's questionnaire, the interview, and the physical examination. Because the information was provided in large part by the patient, it cannot be guaranteed that it has not been purposely or unconsciously manipulated. Every effort  has been made to obtain as much relevant data as possible for this evaluation. It is important to note that the conclusions that lead to this procedure are derived in large part from the available data. Always take into account that the treatment will also be dependent on availability of resources and existing treatment guidelines, considered by other Pain Management Practitioners as being common knowledge and practice, at the time of the intervention. For Medico-Legal purposes, it is also important to point out that variation in procedural techniques and pharmacological choices are the acceptable norm. The indications, contraindications, technique, and results of the above procedure should only be interpreted and judged by a Board-Certified Interventional Pain Specialist with extensive familiarity and expertise in the same exact procedure and technique.

## 2024-01-15 NOTE — Patient Instructions (Signed)
 PLEASE FOLLOW UP WITH PCP REGARDING ELEVATED BLOOD PRESSURE.

## 2024-01-15 NOTE — Progress Notes (Signed)
 Safety precautions to be maintained throughout the outpatient stay will include: orient to surroundings, keep bed in low position, maintain call bell within reach at all times, provide assistance with transfer out of bed and ambulation.

## 2024-01-16 ENCOUNTER — Telehealth: Payer: Self-pay

## 2024-01-16 NOTE — Telephone Encounter (Signed)
 Post procedure follow up.  LM

## 2024-02-13 ENCOUNTER — Telehealth: Payer: Self-pay

## 2024-02-13 ENCOUNTER — Other Ambulatory Visit (INDEPENDENT_AMBULATORY_CARE_PROVIDER_SITE_OTHER)

## 2024-02-13 DIAGNOSIS — R7989 Other specified abnormal findings of blood chemistry: Secondary | ICD-10-CM

## 2024-02-13 LAB — HEPATIC FUNCTION PANEL
ALT: 52 U/L (ref 0–53)
AST: 34 U/L (ref 0–37)
Albumin: 4.8 g/dL (ref 3.5–5.2)
Alkaline Phosphatase: 69 U/L (ref 39–117)
Bilirubin, Direct: 0.1 mg/dL (ref 0.0–0.3)
Total Bilirubin: 0.8 mg/dL (ref 0.2–1.2)
Total Protein: 7.5 g/dL (ref 6.0–8.3)

## 2024-02-13 NOTE — Telephone Encounter (Signed)
 Spoke with patient - he is going to have his liver labs redone today and we scheduled a follow up with Colleen on 03/29/24 to discuss everything

## 2024-02-14 ENCOUNTER — Ambulatory Visit: Payer: Self-pay | Admitting: Nurse Practitioner

## 2024-02-17 DIAGNOSIS — G894 Chronic pain syndrome: Secondary | ICD-10-CM | POA: Insufficient documentation

## 2024-02-17 DIAGNOSIS — M47816 Spondylosis without myelopathy or radiculopathy, lumbar region: Secondary | ICD-10-CM | POA: Insufficient documentation

## 2024-02-17 NOTE — Progress Notes (Unsigned)
 PROVIDER NOTE: Interpretation of information contained herein should be left to medically-trained personnel. Specific patient instructions are provided elsewhere under Patient Instructions section of medical record. This document was created in part using AI and STT-dictation technology, any transcriptional errors that may result from this process are unintentional.  Patient: Bobby Bobby  Service: E/M   PCP: Bobby Lynwood HERO, NP  DOB: 17-Feb-1971  DOS: 02/20/2024  Provider: Emmy MARLA Blanch, NP  MRN: [id number]  Delivery: Face-to-face  Specialty: Interventional Pain Management  Type: Established Patient  Setting: Ambulatory outpatient facility  Specialty designation: 09  Referring Prov.: Bobby Lynwood HERO, NP  Location: Outpatient office facility       History of present illness (HPI) Mr. Bobby Bobby, a 53 y.o. year old male, is here today because of his Lumbar facet arthropathy [M47.816]. Bobby Bobby primary complain today is No chief complaint on file.  Pertinent problems: Bobby Bobby has Headache; GERD (gastroesophageal reflux disease); History of stomach ulcers; Back pain; Degeneration of intervertebral disc of lumbar region with discogenic back pain; Chronic pain syndrome; Lumbar spondylosis; and Lumbar facet arthropathy on their pertinent problem list.  Pain Assessment: Severity of   is reported as a  /10. Location:    / . Onset:  . Quality:  . Timing:  . Modifying factor(s):  SABRA Vitals:  vitals were not taken for this visit.  BMI: Estimated body mass index is 26.94 kg/m as calculated from the following:   Height as of 01/15/24: 5' 7 (1.702 m).   Weight as of 01/15/24: 172 lb (78 kg).  Last encounter: Visit date not found. Last procedure: 01/15/2024  Reason for encounter: post-procedure evaluation and assessment.   Discussed the use of AI scribe software for clinical note transcription with the patient, who gave verbal consent to proceed.  History of Present Illness          Procedure Procedure: Lumbar Facet, Medial Branch Radiofrequency Ablation (RFA) #1  Laterality: Bilateral (-50)  Level: L3, L4, and L5 Medial Branch Level(s). These levels will denervate the L3-4 and L4-5 lumbar facet joints.  Imaging: Fluoroscopy-guided         Anesthesia: Local anesthesia (1-2% Lidocaine ) Sedation: Moderate Sedation                       DOS: 01/15/2024  Performed by: Wallie Sherry, MD   Purpose: Therapeutic/Palliative Indications: Low back pain severe enough to impact quality of life or function. Indications: 1. Lumbar facet arthropathy   2. Lumbar spondylosis   3. Chronic pain syndrome     Bobby Bobby has been dealing with the above chronic pain for longer than three months and has either failed to respond, was unable to tolerate, or simply did not get enough benefit from other more conservative therapies including, but not limited to: 1. Over-the-counter medications 2. Anti-inflammatory medications 3. Muscle relaxants 4. Membrane stabilizers 5. Opioids 6. Physical therapy and/or chiropractic manipulation 7. Modalities (Heat, ice, etc.) 8. Invasive techniques such as nerve blocks. Bobby Bobby has attained more than 50% relief of the pain from a series of diagnostic injections conducted in separate occasions.   Pain Score: Pre-procedure: 7 /10 Post-procedure: 0-No pain/10 Post-Procedure Evaluation  {There is no content from the last Procedure section.}   Effectiveness:  Initial hour after procedure:   ***. Subsequent 4-6 hours post-procedure:   ***. Analgesia past initial 6 hours:   ***. Ongoing improvement:  Analgesic:  *** Function: {Blank single:19197::No benefit,No improvement,Back to baseline,Transient  improvement,Bobby Bobby reports improvement in function,Somewhat improved,Minimal improvement,   ***   } ROM: {Blank single:19197::No benefit,No improvement,Back to baseline,Transient improvement,Bobby Bobby reports improvement in  ROM,Somewhat improved,Minimal improvement,   ***   } Interpretation: ***     Pharmacotherapy Assessment   {There is no content from the last Subjective section.}  Monitoring: [place] PMP: PDMP not reviewed this encounter.       Pharmacotherapy: No side-effects or adverse reactions reported. Compliance: No problems identified. Effectiveness: Clinically acceptable.  No notes on file  UDS:  Summary  Date Value Ref Range Status  11/09/2023 FINAL  Final    Comment:    ==================================================================== Compliance Drug Analysis, Ur ==================================================================== Test                             Result       Flag       Units  Drug Present   Methocarbamol                   PRESENT ==================================================================== Test                      Result    Flag   Units      Ref Range   Creatinine              213              mg/dL      >=79 ==================================================================== Declared Medications:  Medication list was not provided. ==================================================================== For clinical consultation, please call [phone]. ====================================================================     No results found for: CBDTHCR No results found for: D8THCCBX No results found for: D9THCCBX  ROS  Constitutional: Denies any fever or chills Gastrointestinal: No reported hemesis, hematochezia, vomiting, or acute GI distress Musculoskeletal: Denies any acute onset joint swelling, redness, loss of ROM, or weakness Neurological: No reported episodes of acute onset apraxia, aphasia, dysarthria, agnosia, amnesia, paralysis, loss of coordination, or loss of consciousness  Medication Review  AMBULATORY NON FORMULARY MEDICATION, benzonatate , celecoxib , diclofenac , hydrocortisone , mupirocin  ointment, nystatin  cream, and  pantoprazole   History Review  Allergy: Bobby Bobby is allergic to codeine, food, hydrocodone -acetaminophen , hydrocodone -acetaminophen , ibuprofen, and compazine  [prochlorperazine  edisylate]. Drug: Bobby Bobby  reports no history of drug use. Alcohol:  reports no history of alcohol use. Tobacco:  reports that he has never smoked. He has never used smokeless tobacco. Social: Bobby Bobby  reports that he has never smoked. He has never used smokeless tobacco. He reports that he does not drink alcohol and does not use drugs. 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 Surgical: Bobby Bobby  has a past surgical history that includes Elbow surgery (Right, 1987); Appendectomy (1990); Knee arthroscopy (Right, 02/26/2009); Shoulder arthroscopy w/ rotator cuff repair (Right, 2012); Esophagogastroduodenoscopy (08/18/2011); Esophagogastroduodenoscopy (11/11/2011); Esophagogastroduodenoscopy (egd) with propofol  (N/A, 09/03/2018); biopsy (09/03/2018); Colonoscopy with esophagogastroduodenoscopy (egd) (08/31/2021); Multiple tooth extractions; Mass excision (N/A, 10/15/2021); Skin biopsy (N/A, 10/15/2021); Rectal exam under anesthesia (N/A, 10/15/2021); Esophagogastroduodenoscopy (egd) with propofol  (N/A, 11/24/2021); Esophagogastroduodenoscopy (egd) with propofol  (N/A, 02/01/2023); biopsy (02/01/2023); and Balloon dilation (N/A, 02/01/2023). Family: family history includes Asthma in his father; Colon cancer in his father; Colon polyps in his father; Diabetes in  his mother; Heart attack in his father; Heart failure in his father; Hypertension in his father and mother; Liver disease in  his mother; Migraines in his mother; Prostate cancer in his father; Stroke in his mother.  Laboratory Chemistry Profile   Renal Lab Results  Component Value Date   BUN 10 12/13/2023   CREATININE 1.16 12/13/2023   GFR 71.51 12/13/2023   GFRAA >60 09/03/2018   GFRNONAA >60 06/11/2023    Hepatic Lab Results  Component Value Date   AST 34 02/13/2024   ALT 52 02/13/2024   ALBUMIN 4.8 02/13/2024   ALKPHOS 69 02/13/2024   LIPASE 10 (L) 04/30/2023    Electrolytes Lab Results  Component Value Date   NA 138 12/13/2023   K 4.9 12/13/2023   CL 100 12/13/2023   CALCIUM 9.7 12/13/2023    Bone No results found for: VD25OH, VD125OH2TOT, CI6874NY7, CI7874NY7, 25OHVITD1, 25OHVITD2, 25OHVITD3, TESTOFREE, TESTOSTERONE  Inflammation (CRP: Acute Phase) (ESR: Chronic Phase) Lab Results  Component Value Date   CRP 1.0 12/13/2023   ESRSEDRATE 5 12/13/2023   LATICACIDVEN 0.8 08/17/2011         Note: Above Lab results reviewed.  Recent Imaging Review  DG PAIN CLINIC C-ARM 1-60 MIN NO REPORT Fluoro was used, but no Radiologist interpretation will be provided.  Please refer to NOTES tab for provider progress note. Note: Reviewed        Physical Exam  Vitals: There were no vitals taken for this visit. BMI: Estimated body mass index is 26.94 kg/m as calculated from the following:   Height as of 01/15/24: 5' 7 (1.702 m).   Weight as of 01/15/24: 172 lb (78 kg). Ideal: Patient weight not recorded General appearance: Well nourished, well developed, and well hydrated. In no apparent acute distress Mental status: Alert, oriented x 3 (person, place, & time)       Respiratory: No evidence of acute respiratory distress Eyes: PERLA   Assessment   Diagnosis Status  1. Lumbar facet arthropathy   2. Lumbar spondylosis   3. Chronic pain syndrome   4. Degeneration of intervertebral disc of lumbar region with discogenic back pain    Controlled Controlled Controlled    Updated Problems: Problem  Chronic Pain Syndrome  Lumbar Spondylosis  Lumbar Facet Arthropathy    Plan of Care  Problem-specific:  Assessment and Plan            Bobby Bobby has a current medication list which includes the following long-term medication(s): pantoprazole .  Pharmacotherapy (Medications Ordered): No orders of the defined types were placed in this encounter.  Orders:  No orders of the defined types were placed in this encounter.    {There is no content from the last Plan section.}   No follow-ups on file.    Recent Visits Date Type Provider Dept  01/15/24 Procedure visit Marcelino Nurse, MD [facility]  12/20/23 Office Visit Marcelino Nurse, MD [facility]  12/04/23 Procedure visit Marcelino Nurse, MD [facility]  Showing recent visits within past 90 days and meeting all other requirements Future Appointments Date Type Provider Dept  02/20/24 Appointment Bonita Brindisi K, NP [facility]  Showing future appointments within next 90 days and meeting all other requirements  I discussed the assessment and treatment plan with the patient. The patient was provided an opportunity to ask questions and all were answered. The patient agreed with the plan and demonstrated an understanding of the instructions.  Patient advised to call  back or seek an in-person evaluation if the symptoms or condition worsens.  Duration of encounter: *** minutes.  Total time on encounter, as per AMA guidelines included both the face-to-face and non-face-to-face time personally spent by the physician and/or other qualified health care professional(s) on the day of the encounter (includes time in activities that require the physician or other qualified health care professional and does not include time in activities normally performed by clinical staff). Physician's time may include the following activities when performed: Preparing to see the  patient (e.g., pre-charting review of records, searching for previously ordered imaging, lab work, and nerve conduction tests) Review of prior analgesic pharmacotherapies. Reviewing PMP Interpreting ordered tests (e.g., lab work, imaging, nerve conduction tests) Performing post-procedure evaluations, including interpretation of diagnostic procedures Obtaining and/or reviewing separately obtained history Performing a medically appropriate examination and/or evaluation Counseling and educating the patient/family/caregiver Ordering medications, tests, or procedures Referring and communicating with other health care professionals (when not separately reported) Documenting clinical information in the electronic or other health record Independently interpreting results (not separately reported) and communicating results to the patient/ family/caregiver Care coordination (not separately reported)  Note by: Tiffeny Minchew K Geovanie Winnett, NP (TTS and AI technology used. I apologize for any typographical errors that were not detected and corrected.) Date: 02/20/2024; Time: 9:06 PM

## 2024-02-20 ENCOUNTER — Ambulatory Visit: Admitting: Nurse Practitioner

## 2024-02-20 DIAGNOSIS — M47816 Spondylosis without myelopathy or radiculopathy, lumbar region: Secondary | ICD-10-CM

## 2024-02-20 DIAGNOSIS — Z91199 Patient's noncompliance with other medical treatment and regimen due to unspecified reason: Secondary | ICD-10-CM

## 2024-02-20 DIAGNOSIS — G894 Chronic pain syndrome: Secondary | ICD-10-CM

## 2024-02-20 DIAGNOSIS — M5136 Other intervertebral disc degeneration, lumbar region with discogenic back pain only: Secondary | ICD-10-CM

## 2024-02-22 ENCOUNTER — Encounter: Admitting: Nurse Practitioner

## 2024-02-27 ENCOUNTER — Encounter: Payer: Self-pay | Admitting: Nurse Practitioner

## 2024-02-27 ENCOUNTER — Ambulatory Visit: Attending: Nurse Practitioner | Admitting: Nurse Practitioner

## 2024-02-27 VITALS — BP 143/82 | HR 70 | Temp 97.9°F | Resp 20 | Ht 67.0 in | Wt 175.0 lb

## 2024-02-27 DIAGNOSIS — M47816 Spondylosis without myelopathy or radiculopathy, lumbar region: Secondary | ICD-10-CM | POA: Diagnosis not present

## 2024-02-27 DIAGNOSIS — G894 Chronic pain syndrome: Secondary | ICD-10-CM | POA: Diagnosis not present

## 2024-02-27 DIAGNOSIS — M5136 Other intervertebral disc degeneration, lumbar region with discogenic back pain only: Secondary | ICD-10-CM | POA: Diagnosis not present

## 2024-02-27 MED ORDER — PREDNISONE 20 MG PO TABS: ORAL_TABLET | ORAL | 0 refills | Status: AC

## 2024-02-27 MED ORDER — KETOROLAC TROMETHAMINE 30 MG/ML IJ SOLN
30.0000 mg | Freq: Once | INTRAMUSCULAR | Status: AC
  Administered 2024-02-27: 14:00:00 30 mg via INTRAMUSCULAR
  Filled 2024-02-27: qty 1

## 2024-02-27 MED ORDER — METHOCARBAMOL 1000 MG/10ML IJ SOLN
200.0000 mg | Freq: Once | INTRAMUSCULAR | Status: AC
  Administered 2024-02-27: 14:00:00 1000 mg via INTRAMUSCULAR
  Filled 2024-02-27: qty 10

## 2024-02-27 NOTE — Progress Notes (Signed)
 PROVIDER NOTE: Interpretation of information contained herein should be left to medically-trained personnel. Specific patient instructions are provided elsewhere under Patient Instructions section of medical record. This document was created in part using AI and STT-dictation technology, any transcriptional errors that may result from this process are unintentional.  Patient: Evalene FORBES Crandall  Service: E/M   PCP: Crandall Lynwood HERO, NP  DOB: 1970/08/27  DOS: 02/27/2024  Provider: Emmy MARLA Blanch, NP  MRN: [id number]  Delivery: Face-to-face  Specialty: Interventional Pain Management  Type: Established Patient  Setting: Ambulatory outpatient facility  Specialty designation: 09  Referring Prov.: Crandall Lynwood HERO, NP  Location: Outpatient office facility       History of present illness (HPI) Mr. EBONY RICKEL, a 53 y.o. year old male, is here today because of his Degeneration of intervertebral disc of lumbar region with discogenic back pain [M51.360]. Mr. Marley primary complain today is Back Pain (Right lower back, radiating down right leg)  Pertinent problems: Mr. Morimoto has Headache; GERD (gastroesophageal reflux disease); History of stomach ulcers; Back pain; Degeneration of intervertebral disc of lumbar region with discogenic back pain; Chronic pain syndrome; Lumbar spondylosis; and Lumbar facet arthropathy on their pertinent problem list.  Pain Assessment: Severity of Chronic pain is reported as a 10-Worst pain ever/10. Location: Back Lower, Right/Down right lower back and down right leg to ankle. Onset: More than a month ago. Quality: Aching, Throbbing, Sharp. Timing: Constant. Modifying factor(s): Denies. Vitals:  height is 5' 7 (1.702 m) and weight is 175 lb (79.4 kg). His temporal temperature is 97.9 F (36.6 C). His blood pressure is 143/82 (abnormal) and his pulse is 70. His respiration is 20 and oxygen saturation is 100%.  BMI: Estimated body mass index is 27.41 kg/m as calculated from the  following:   Height as of this encounter: 5' 7 (1.702 m).   Weight as of this encounter: 175 lb (79.4 kg).  Last encounter: Visit date not found. Last procedure: 01/15/2024  Reason for encounter: post-procedure evaluation and assessment.   Discussed the use of AI scribe software for clinical note transcription with the patient, who gave verbal consent to proceed.  History of Present Illness   Aedyn Kempfer is a 53 year old male who presents with severe lower back pain radiating to the right leg.  He experiences severe lower back pain radiating down the back of his right leg, with an intensity described as 'twenty' on a scale of one to ten. 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  He underwent a radiofrequency ablation on November 3rd, which initially provided some relief. He experienced about 50% pain relief for the first hour and 80% relief for four to six hours post-procedure, but the pain returned to baseline levels thereafter. He has not experienced lasting relief from this treatment.  He recently had blood work done to check his liver enzymes; the patient reports that he was told the results were normal.  He is currently not taking any medications that effectively manage his pain, expressing frustration with the lack of relief from previous treatments.     Procedure Procedure: Lumbar Facet, Medial Branch Radiofrequency Ablation (RFA) #1  Laterality: Bilateral (-50)  Level: L3, L4, and L5 Medial Branch Level(s). These levels will denervate the L3-4 and L4-5 lumbar facet joints.  Imaging: Fluoroscopy-guided         Anesthesia: Local anesthesia (1-2% Lidocaine ) Sedation: Moderate Sedation  DOS: 01/15/2024  Performed by: Wallie Sherry, MD   Purpose: Therapeutic/Palliative Indications: Low back pain severe enough to impact quality of life or function. Indications: 1. Lumbar  facet arthropathy   2. Lumbar spondylosis   3. Chronic pain syndrome     Mr. Fricke has been dealing with the above chronic pain for longer than three months and has either failed to respond, was unable to tolerate, or simply did not get enough benefit from other more conservative therapies including, but not limited to: 1. Over-the-counter medications 2. Anti-inflammatory medications 3. Muscle relaxants 4. Membrane stabilizers 5. Opioids 6. Physical therapy and/or chiropractic manipulation 7. Modalities (Heat, ice, etc.) 8. Invasive techniques such as nerve blocks. Mr. Bultman has attained more than 50% relief of the pain from a series of diagnostic injections conducted in separate occasions.   Pain Score: Pre-procedure: 7 /10 Post-procedure: 0-No pain/10  Post-Procedure Evaluation  Effectiveness:  Initial hour after procedure: 50 % . Subsequent 4-6 hours post-procedure: 80 % . Analgesia past initial 6 hours: 0 % (Patient having intense pain on right lower back, down right leg to ankle) . Ongoing improvement:  Analgesic:  0 % Function: Back to baseline ROM: Back to baseline Interpretation: Mr. Mikkel Misuraca underwent a therapeutic/palliative lumbar facet radiofrequency ablation (RFA) on January 15, 2024.  He reports initially after our 50% pain relief, then after 4 to 6 hours 80% pain relief, however, he reports that the pain back to baseline and did not get any relief or improvement since then.  Pharmacotherapy Assessment   Prednisone  taper for 9 days Monitoring: [place] PMP: PDMP not reviewed this encounter.       Pharmacotherapy: No side-effects or adverse reactions reported. Compliance: No problems identified. Effectiveness: Clinically acceptable.  No notes on file  UDS:  Summary  Date Value Ref Range Status  11/09/2023 FINAL  Final    Comment:    ==================================================================== Compliance Drug Analysis,  Ur ==================================================================== Test                             Result       Flag       Units  Drug Present   Methocarbamol                   PRESENT ==================================================================== Test                      Result    Flag   Units      Ref Range   Creatinine              213              mg/dL      >=79 ==================================================================== Declared Medications:  Medication list was not provided. ==================================================================== For clinical consultation, please call [phone]. ====================================================================     No results found for: CBDTHCR No results found for: D8THCCBX No results found for: D9THCCBX  ROS  Constitutional: Denies any fever or chills Gastrointestinal: No reported hemesis, hematochezia, vomiting, or acute GI distress Musculoskeletal: Back Pain (Right lower back, radiating down right leg) Neurological: No reported episodes of acute onset apraxia, aphasia, dysarthria, agnosia, amnesia, paralysis, loss of coordination, or loss of consciousness  Medication Review  AMBULATORY NON FORMULARY MEDICATION, benzonatate , celecoxib , diclofenac , hydrocortisone , mupirocin  ointment, nystatin  cream, pantoprazole , and predniSONE   History Review  Allergy: Mr. Sauceda is allergic to codeine, food, hydrocodone -acetaminophen , hydrocodone -acetaminophen , ibuprofen, and compazine  [prochlorperazine  edisylate]. Drug:  Mr. Simones  reports no history of drug use. Alcohol:  reports no history of alcohol use. Tobacco:  reports that he has never smoked. He has never used smokeless tobacco. Social: Mr. Wurtz  reports that he has never smoked. He has never used smokeless tobacco. He reports that he does not drink alcohol and does not use drugs. 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 Surgical: Mr. Gill  has a past surgical history that includes Elbow surgery (Right, 1987); Appendectomy (1990); Knee arthroscopy (Right, 02/26/2009); Shoulder arthroscopy w/ rotator cuff repair (Right, 2012); Esophagogastroduodenoscopy (08/18/2011); Esophagogastroduodenoscopy (11/11/2011); Esophagogastroduodenoscopy (egd) with propofol  (N/A, 09/03/2018); biopsy (09/03/2018); Colonoscopy with esophagogastroduodenoscopy (egd) (08/31/2021); Multiple tooth extractions; Mass excision (N/A, 10/15/2021); Skin biopsy (N/A, 10/15/2021); Rectal exam under anesthesia (N/A, 10/15/2021); Esophagogastroduodenoscopy (egd) with propofol  (N/A, 11/24/2021); Esophagogastroduodenoscopy (egd) with propofol  (N/A, 02/01/2023); biopsy (02/01/2023); and Balloon dilation (N/A, 02/01/2023). Family: family history includes Asthma in his father; Colon cancer in his father; Colon polyps in his father; Diabetes in his mother; Heart attack in his father; Heart failure in his father; Hypertension in his father and mother; Liver disease in his mother; Migraines in his mother; Prostate cancer in his father; Stroke in his mother.  Laboratory Chemistry Profile   Renal Lab Results  Component Value Date   BUN 10 12/13/2023   CREATININE 1.16 12/13/2023   GFR 71.51 12/13/2023   GFRAA >60 09/03/2018   GFRNONAA >60 06/11/2023    Hepatic Lab Results  Component Value Date   AST 34 02/13/2024   ALT 52 02/13/2024   ALBUMIN 4.8 02/13/2024   ALKPHOS 69 02/13/2024   LIPASE 10 (L) 04/30/2023    Electrolytes Lab Results  Component Value Date   NA 138 12/13/2023   K 4.9 12/13/2023   CL 100 12/13/2023   CALCIUM  9.7 12/13/2023    Bone No results found for: VD25OH, VD125OH2TOT, CI6874NY7, CI7874NY7, 25OHVITD1, 25OHVITD2, 25OHVITD3, TESTOFREE, TESTOSTERONE  Inflammation (CRP: Acute Phase) (ESR: Chronic Phase) Lab Results  Component Value Date   CRP 1.0 12/13/2023   ESRSEDRATE 5 12/13/2023   LATICACIDVEN 0.8 08/17/2011         Note: Above Lab results reviewed.  Recent Imaging Review  DG PAIN CLINIC C-ARM 1-60 MIN NO REPORT Fluoro was used, but no Radiologist interpretation will be provided.  Please refer to NOTES tab for provider progress note. Note: Reviewed        Physical Exam  Vitals: BP (!) 143/82 (BP Location: Left Arm, Patient Position: Sitting, Cuff Size: Normal)   Pulse 70   Temp 97.9 F (36.6 C) (Temporal)   Resp 20   Ht 5' 7 (1.702 m)   Wt 175 lb (79.4 kg)   SpO2 100%   BMI 27.41 kg/m  BMI: Estimated body mass index is 27.41 kg/m as calculated from the following:   Height as of this encounter: 5' 7 (1.702 m).   Weight as of this encounter: 175 lb (79.4 kg). Ideal: Ideal body weight: 66.1 kg (145 lb 11.6 oz) Adjusted ideal body weight: 71.4 kg (157 lb 6.9 oz) General appearance: Well nourished, well developed, and well hydrated. In no apparent acute distress Mental status: Alert, oriented x 3 (person, place, & time)       Respiratory: No evidence  of acute respiratory distress Eyes: PERLA  Musculoskeletal: +Back Pain (Right lower back, radiating down right leg) Assessment   Diagnosis Status  1. Degeneration of intervertebral disc of lumbar region with discogenic back pain   2. Chronic pain syndrome   3. Lumbar spondylosis   4. Lumbar facet arthropathy    Controlled Controlled Controlled   Updated Problems: No problems updated.  Plan of Care  Problem-specific:  Assessment and Plan    Lumbar disc degeneration with chronic discogenic back pain and radiculopathy Chronic lumbar disc degeneration with severe discogenic back pain and  radiculopathy. Recent radiofrequency ablation provided temporary relief. Current pain is severe, rated 20/10, radiating to the right leg. Previous treatments ineffective. - Prescribed prednisone  taper for 9 days. - Administered Toradol  IM injection. - Prescribed Robaxin  (methocarbamol ). - Provided work note for time off until Monday.  Chronic pain syndrome Significant impact on daily activities and work as a it sales professional. Current pain management strategies insufficient. - Scheduled follow-up appointment in January for re-evaluation.       Mr. Parmvir E Mcelwain has a current medication list which includes the following long-term medication(s): pantoprazole .  Pharmacotherapy (Medications Ordered): Meds ordered this encounter  Medications   predniSONE  (DELTASONE ) 20 MG tablet    Sig: Take 3 tablets (60 mg total) by mouth daily with breakfast for 3 days, THEN 2 tablets (40 mg total) daily with breakfast for 3 days, THEN 1 tablet (20 mg total) daily with breakfast for 3 days.    Dispense:  18 tablet    Refill:  0   ketorolac  (TORADOL ) 30 MG/ML injection 30 mg   methocarbamol  (ROBAXIN ) injection 200 mg   Orders:  No orders of the defined types were placed in this encounter.       Return in about 1 month (around 03/29/2024) for (F2F), eval by MD with Dr. Marcelino .    Recent Visits Date Type Provider Dept  01/15/24 Procedure visit Marcelino Nurse, MD [facility]  12/20/23 Office Visit Marcelino Nurse, MD [facility]  12/04/23 Procedure visit Marcelino Nurse, MD [facility]  Showing recent visits within past 90 days and meeting all other requirements Today's Visits Date Type Provider Dept  02/27/24 Office Visit Jazlynne Milliner K, NP [facility]  Showing today's visits and meeting all other requirements Future Appointments No visits were found meeting these conditions. Showing future appointments within next 90 days and meeting all other requirements  I  discussed the assessment and treatment plan with the patient. The patient was provided an opportunity to ask questions and all were answered. The patient agreed with the plan and demonstrated an understanding of the instructions.  Patient advised to call back or seek an in-person evaluation if the symptoms or condition worsens.  I personally spent a total of 20 minutes in the care of the patient today including preparing to see the patient, getting/reviewing separately obtained history, performing a medically appropriate exam/evaluation, counseling and educating, placing orders, referring and communicating with other health care professionals, documenting clinical information in the EHR, independently interpreting results, communicating results, and coordinating care.   Note by: Ahilyn Nell K Trennon Torbeck, NP (TTS and AI technology used. I apologize for any typographical errors that were not detected and corrected.) Date: 02/27/2024; Time: 2:09 PM

## 2024-03-28 NOTE — Progress Notes (Unsigned)
 "    03/28/2024 Bobby Mcbride [id number] Dec 25, 1970   Chief Complaint:  History of Present Illness: Bobby Mcbride is a 54 year old male with a past medical history of arthritis, hepatic steatosis, peptic esophageal stricture and GERD.  Previously followed by Dr. Teressa, now by Dr. Abran.    Discussed the use of AI scribe software for clinical note transcription with the patient, who gave verbal consent to proceed.  History of Present Illness  LFTs 02/2024 were normal.  Saw Dr. Lonni Pizza 12/18/2023, perianal psoriasis.      Latest Ref Rng & Units 12/13/2023   12:03 PM 08/02/2023   12:00 AM 06/11/2023    7:44 PM  CBC  WBC 4.0 - 10.5 K/uL 8.9  10.5     9.9   Hemoglobin 13.0 - 17.0 g/dL 82.6  82.4     83.3   Hematocrit 39.0 - 52.0 % 51.6  53     46.7   Platelets 150.0 - 400.0 K/uL 227.0  279     237      This result is from an external source.       Latest Ref Rng & Units 02/13/2024   11:56 AM 12/22/2023    1:57 PM 12/13/2023   12:03 PM  CMP  Glucose 70 - 99 mg/dL   86   BUN 6 - 23 mg/dL   10   Creatinine 9.59 - 1.50 mg/dL   8.83   Sodium 864 - 854 mEq/L   138   Potassium 3.5 - 5.1 mEq/L   4.9   Chloride 96 - 112 mEq/L   100   CO2 19 - 32 mEq/L   29   Calcium 8.4 - 10.5 mg/dL   9.7   Total Protein 6.0 - 8.3 g/dL 7.5  7.6  8.1   Total Bilirubin 0.2 - 1.2 mg/dL 0.8  0.7  0.7   Alkaline Phos 39 - 117 U/L 69  68  71   AST 0 - 37 U/L 34  36  44   ALT 0 - 53 U/L 52  60  74     GI PROCEDURES:   07/2011 EGD showed non-bleeding Mallory Weiss tear, mild duodenitis and also nodularity at GE junction, biopsies actually showed inflamed cardia. No h.pylori.  Colonoscopy 08/2011 normal other than hemorrhoids 11/2017 EGD LA grade A esophagitis, ortherwise normal 09/2018 EGD LA Grade A esophagitis , mild stenosis at GEJ dilated to 18mm- had laryngeal spasm and unable to proceed further. Pathology shows gastritis, no dysplasia.    EGD 04/2021 Dr. Teressa indication dysphagia,  findings esophagitis, focal peptic appearing stricture that was biopsied.  Biopsies only showed excessive eosinophils but no sign of tumor or cancer.  GE junction was dilated to 20 mm, he was advised to stay on PPI twice daily for at least 2 or 3 months and then back down to once daily.   Colonoscopy 08/31/2021:  Identified a 1 cm rubbery perianal nodule of uncertain etiology. Colon was otherwise normal with exception of some small internal hemorrhoids.    EGD 11/07/2023: 1. GERD  2. History of erosive esophagitis. Healed on twice daily PPI  3. Benign distal esophageal stricture status post dilation to 20 mm  4. Small hiatal hernia. Otherwise normal EGD.  Colonoscopy 12/26/2023: - The entire examined colon is normal on direct and retroflexion views. - No specimens collected. - 10 year recall colonoscopy     Past Medical History:  Diagnosis Date   Arthritis  Dysphasia    chronic intermittant   Esophagitis, eosinophilic 08/2021   followed by dr teressa   GERD (gastroesophageal reflux disease)    Headache(784.0)    Hemorrhoids    Hiatal hernia    History of echocardiogram    a. 07/2022 Echo: EF 60-65%, no rwma, nl RV size/fxn.  No significant valvular disease.   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 per pt dx age 39; b. 07/2022 Echo: No MVP/MR.   Perianal lesion    Pre-syncope    a. 07/2022 Zio x 2 (total 4 wks): 1.  Predominantly sinus rhythm with average heart rate of 64 (38-144).  Rare PACs and PVCs. 2.  Predominantly sinus rhythm at average of 58 (37-99).  Rare PACs and PVCs.   Past Surgical History:  Procedure Laterality Date   APPENDECTOMY  1990   BALLOON DILATION N/A 02/01/2023    Procedure: BALLOON DILATION;  Surgeon: Avram Lupita BRAVO, MD;  Location: [facility] ENDOSCOPY;  Service: Gastroenterology;  Laterality: N/A;   BIOPSY  09/03/2018   Procedure: BIOPSY;  Surgeon: Charlanne Groom, MD;  Location: [facility] ENDOSCOPY;  Service: Endoscopy;;   BIOPSY  02/01/2023   Procedure: BIOPSY;  Surgeon: Avram Lupita BRAVO, MD;  Location: [facility] ENDOSCOPY;  Service: Gastroenterology;;   COLONOSCOPY WITH ESOPHAGOGASTRODUODENOSCOPY (EGD)  08/31/2021   by dr teressa   ELBOW SURGERY Right 1987   ESOPHAGOGASTRODUODENOSCOPY  08/18/2011   Procedure: ESOPHAGOGASTRODUODENOSCOPY (EGD);  Surgeon: Gordy CHRISTELLA Starch, MD;  Location: [facility];  Service: Gastroenterology;  Laterality: N/A;   ESOPHAGOGASTRODUODENOSCOPY  11/11/2011   Procedure: ESOPHAGOGASTRODUODENOSCOPY (EGD);  Surgeon: Lupita BRAVO Avram, MD;  Location: [facility];  Service: Endoscopy;  Laterality: N/A;   ESOPHAGOGASTRODUODENOSCOPY (EGD) WITH PROPOFOL  N/A 09/03/2018   Procedure: ESOPHAGOGASTRODUODENOSCOPY (EGD) WITH PROPOFOL ;  Surgeon: Charlanne Groom, MD;  Location: [facility] ENDOSCOPY;  Service: Endoscopy;  Laterality: N/A;   ESOPHAGOGASTRODUODENOSCOPY (EGD) WITH PROPOFOL  N/A 11/24/2021   Procedure: ESOPHAGOGASTRODUODENOSCOPY (EGD) WITH PROPOFOL ;  Surgeon: Abran Norleen SAILOR, MD;  Location: [facility] ENDOSCOPY;  Service: Gastroenterology;  Laterality: N/A;   ESOPHAGOGASTRODUODENOSCOPY (EGD) WITH PROPOFOL  N/A 02/01/2023   Procedure: ESOPHAGOGASTRODUODENOSCOPY (EGD) WITH PROPOFOL ;  Surgeon: Avram Lupita BRAVO, MD;  Location: [facility] ENDOSCOPY;  Service: Gastroenterology;  Laterality: N/A;   KNEE ARTHROSCOPY Right 02/26/2009   @[facility]  by dr c. blackman   MASS EXCISION N/A 10/15/2021   Procedure: EXCISION OF PERIANAL MASS;  Surgeon: Teresa Lonni CHRISTELLA, MD;  Location: [facility];  Service: General;  Laterality: N/A;   MULTIPLE TOOTH EXTRACTIONS     several ---  w/ IV sedation  last extraction 05/ 2023   RECTAL EXAM UNDER ANESTHESIA N/A 10/15/2021   Procedure: ANORECTAL EXAM  UNDER ANESTHESIA;  Surgeon: Teresa Lonni CHRISTELLA, MD;  Location: [facility];  Service: General;  Laterality: N/A;   SHOULDER ARTHROSCOPY W/ ROTATOR CUFF REPAIR Right 2012   SKIN BIOPSY N/A 10/15/2021   Procedure: BIOPSY OF GLUTEAL SKIN;  Surgeon: Teresa Lonni CHRISTELLA, MD;  Location: [facility];  Service: General;  Laterality: N/A;     Current Medications, Allergies, Past Medical History, Past Surgical History, Family History and Social History were reviewed in [employer]  electronic medical record.   Review of Systems:   Constitutional: Negative for fever, sweats, chills or weight loss.  Respiratory: Negative for shortness of breath.   Cardiovascular: Negative for chest pain, palpitations and leg swelling.  Gastrointestinal: See HPI.  Musculoskeletal: Negative for back pain or muscle aches.  Neurological: Negative for dizziness, headaches or paresthesias.    Physical Exam: There were no vitals taken for this visit. General: in no acute distress. Head: Normocephalic and atraumatic. Eyes: No scleral icterus. Conjunctiva pink . Ears: Normal auditory acuity. Mouth: Dentition intact. No ulcers or lesions.  Lungs: Clear throughout to auscultation. Heart: Regular rate and rhythm, no murmur. Abdomen: Soft, nontender and nondistended. No masses or hepatomegaly. Normal bowel sounds x 4 quadrants.  Rectal: *** Musculoskeletal: Symmetrical with no gross deformities. Extremities: No edema. Neurological: Alert oriented x 4. No focal deficits.  Psychological: Alert and cooperative. Normal mood and affect  Assessment and Recommendations: ***  "

## 2024-03-29 ENCOUNTER — Ambulatory Visit: Admitting: Nurse Practitioner

## 2024-04-02 ENCOUNTER — Ambulatory Visit: Admitting: Student in an Organized Health Care Education/Training Program

## 2024-05-19 IMAGING — DX DG CHEST 1V PORT
1 series · 1 of 1 positions shown · non-contrast
Comparison: 09/03/2018

CLINICAL DATA: Cough and pain.

EXAM:
PORTABLE CHEST 1 VIEW

[chest ap]
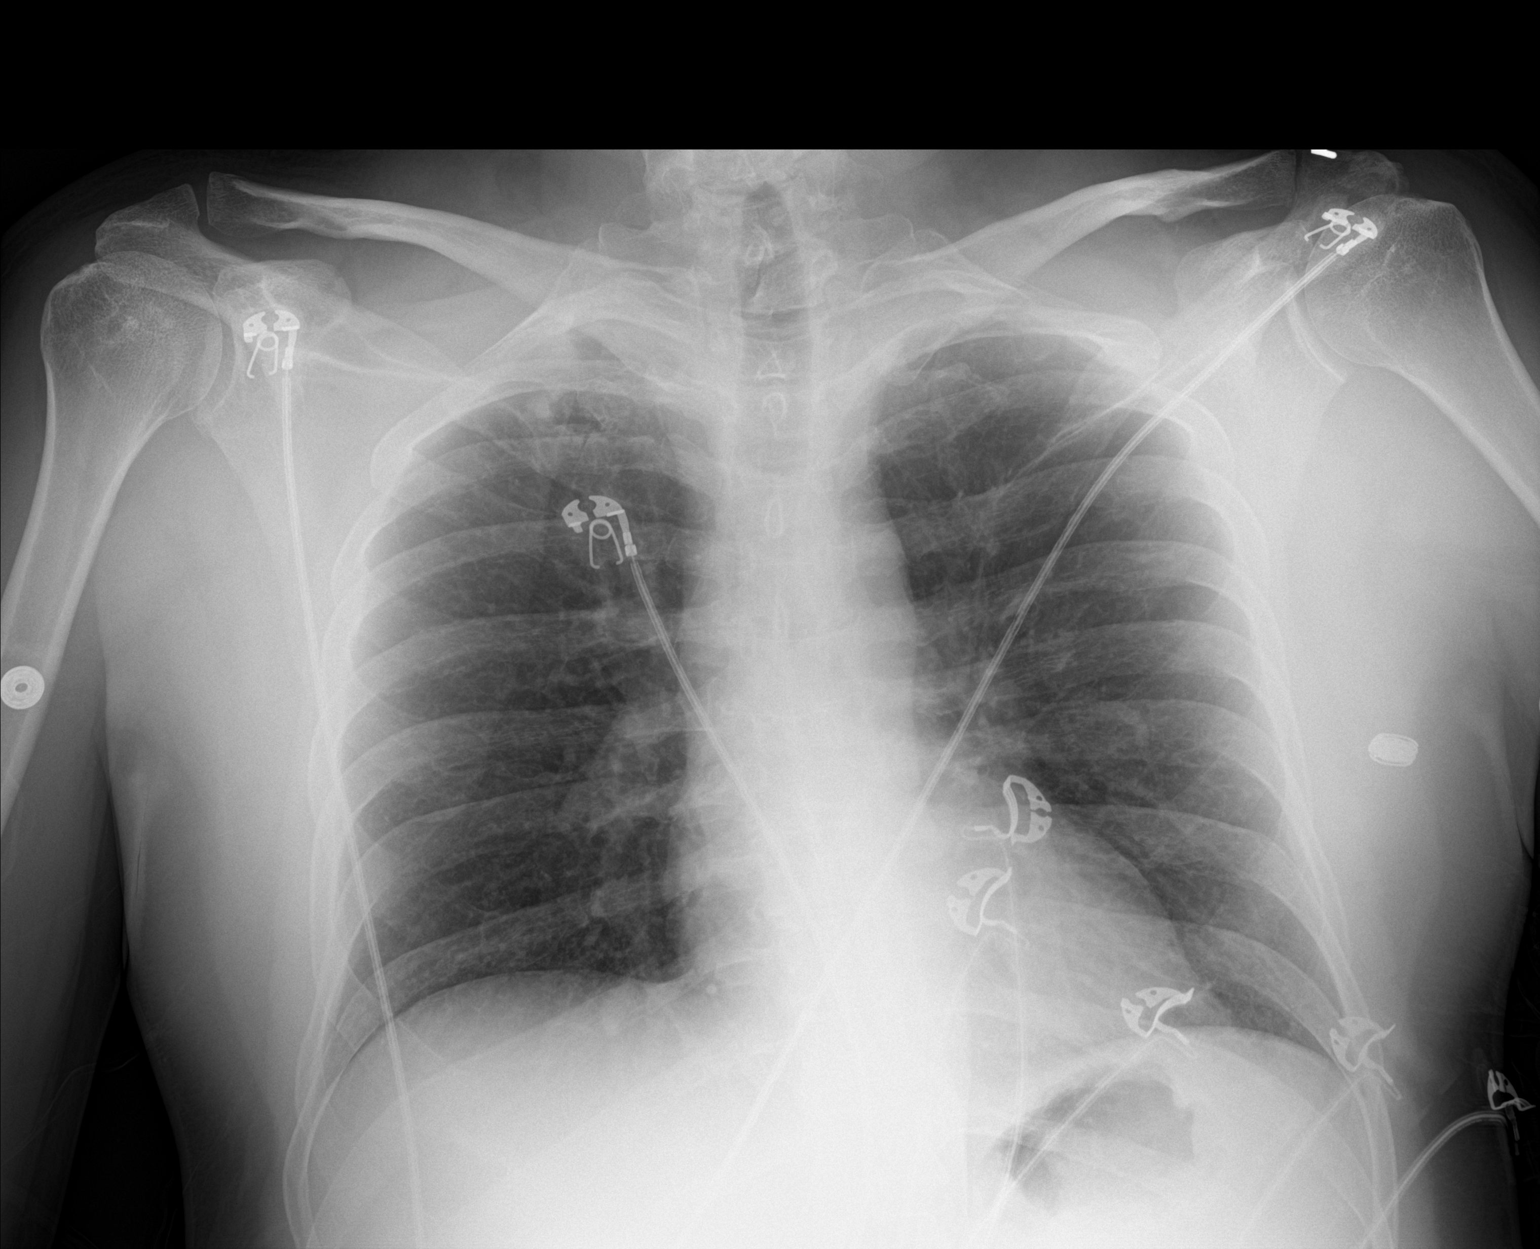

[1 of 1 positions shown; findings below may reference images not displayed]

FINDINGS: Increased density at the right apex where there are non segmented
ribs. No nodule by recent neck CT which covers this area. There is
no edema, consolidation, effusion, or pneumothorax. Normal heart
size and mediastinal contours.
IMPRESSION: No acute finding.

## 2024-05-23 IMAGING — CT CT ABD-PELV W/ CM
2 of 5 series · 16 of 46 positions shown, 18 images · IV contrast (OMNIPAQUE 300)
Comparison: 01/30/2012

CLINICAL DATA: Postprandial nausea and vomiting for 6 weeks.
Unintentional 10 lb weight loss. Constipation.

EXAM:
CT ABDOMEN AND PELVIS WITH CONTRAST
TECHNIQUE: Multidetector CT imaging of the abdomen and pelvis was performed
using the standard protocol following bolus administration of
intravenous contrast.

[Series 2: abd/pel w · axial · 0.73mm/px · z∈[-506,-91]mm · 13 of 93 slices shown, 15 images]
[im 5/93  soft-tissue]
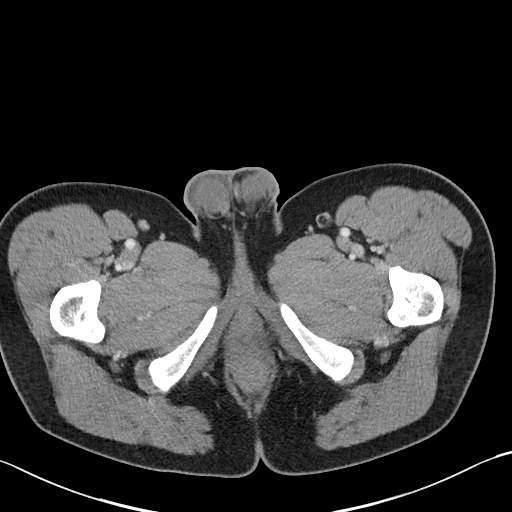
[im 5/93  bone]
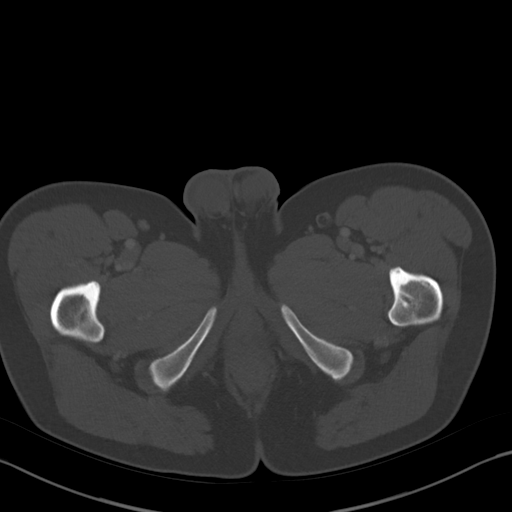
[im 15/93  soft-tissue]
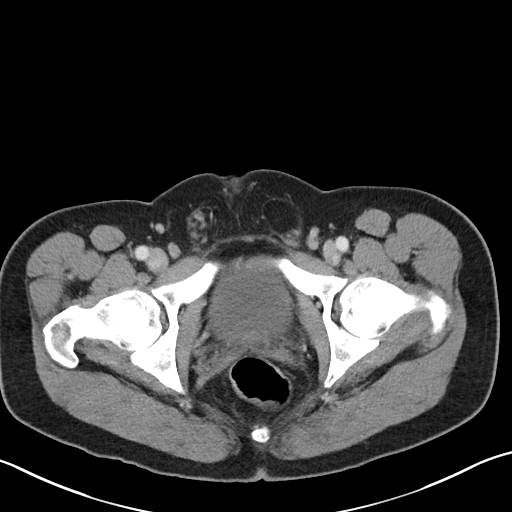
[im 20/93  soft-tissue]
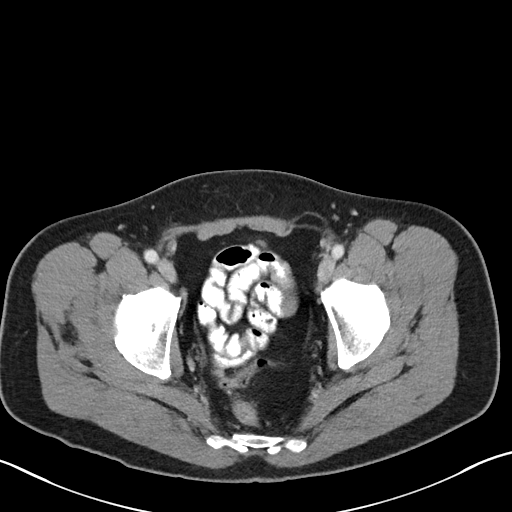
[im 25/93  soft-tissue]
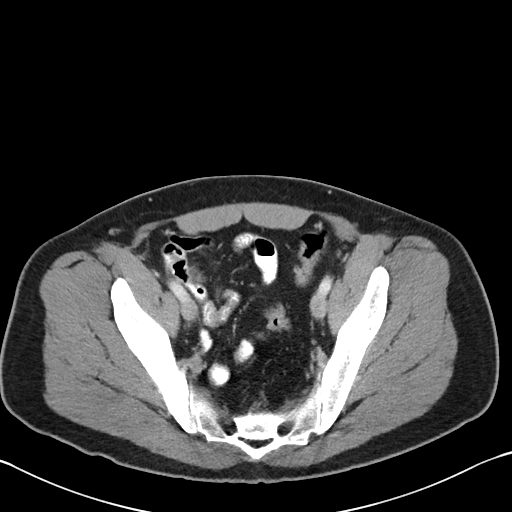
[im 34/93  soft-tissue]
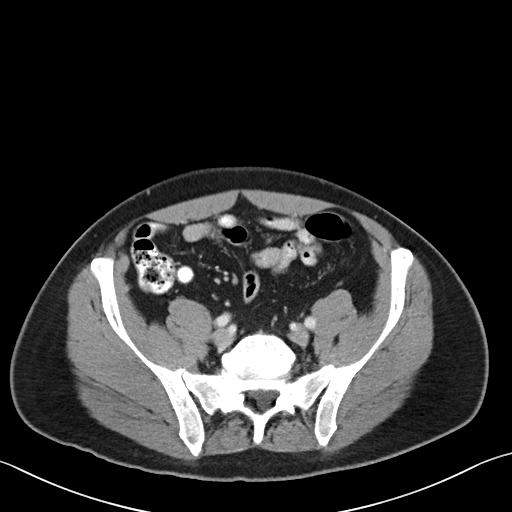
[im 39/93  soft-tissue]
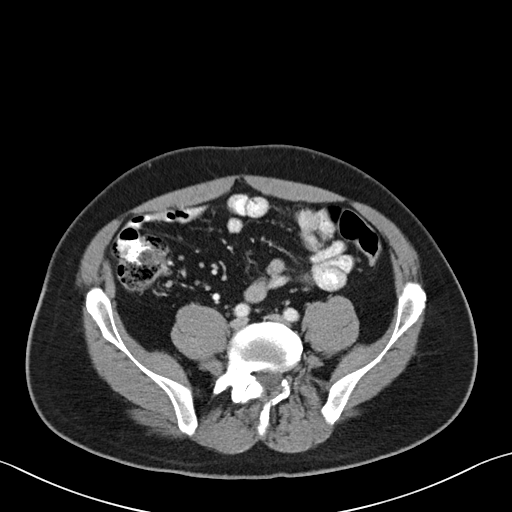
[im 49/93  soft-tissue]
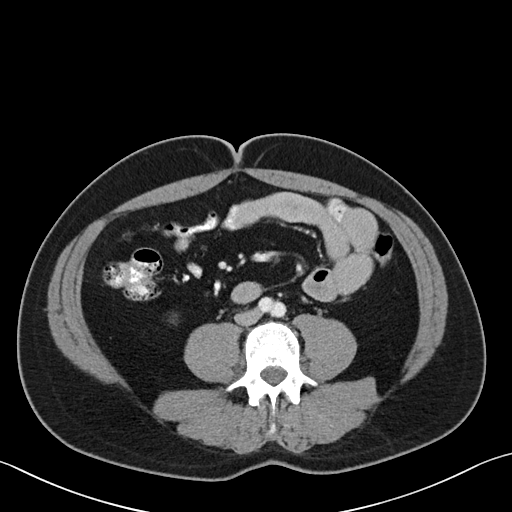
[im 54/93  soft-tissue]
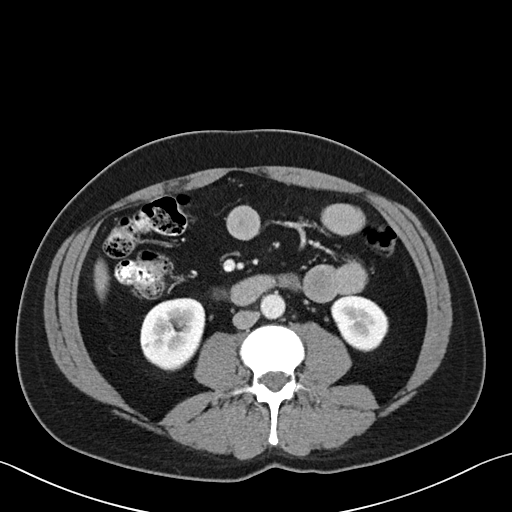
[im 59/93  soft-tissue]
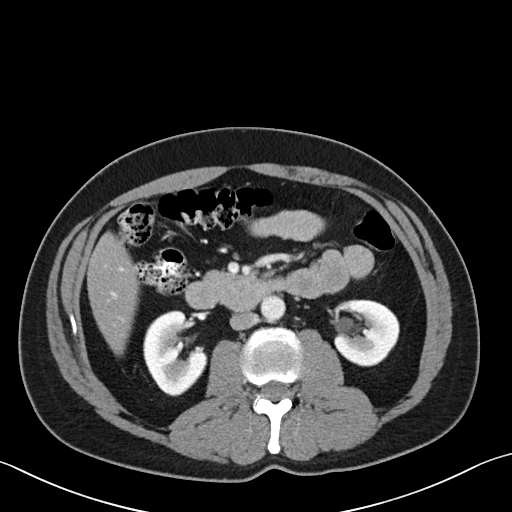
[im 59/93  bone]
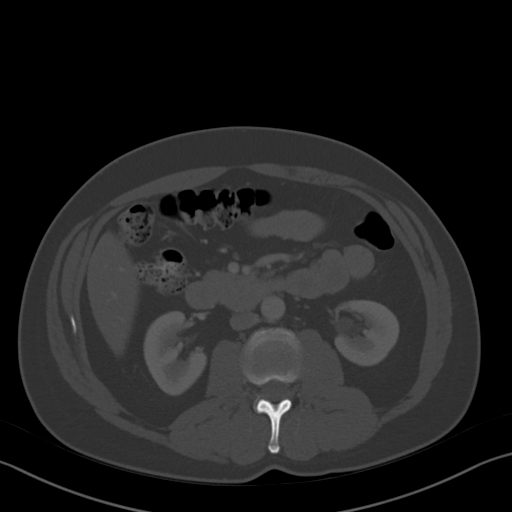
[im 68/93  soft-tissue]
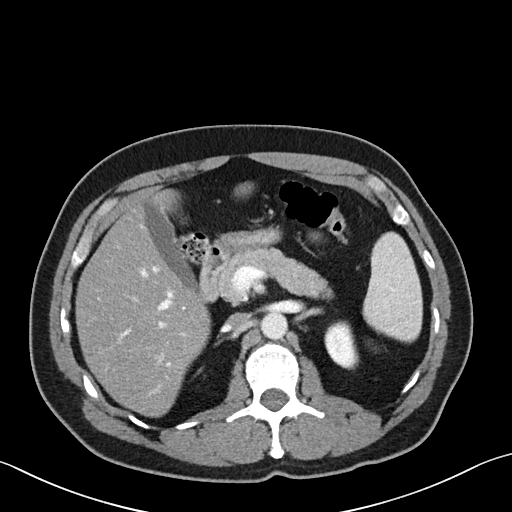
[im 73/93  soft-tissue]
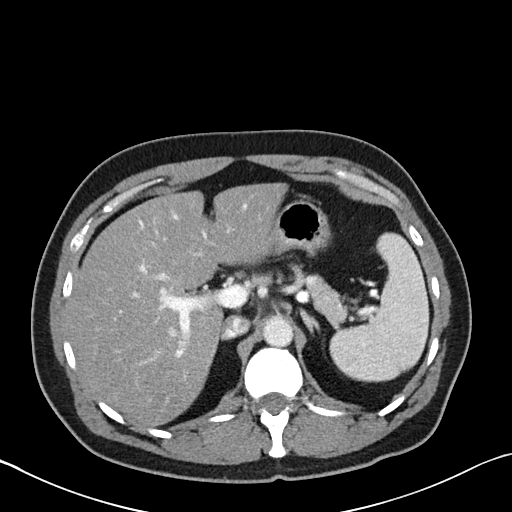
[im 78/93  soft-tissue]
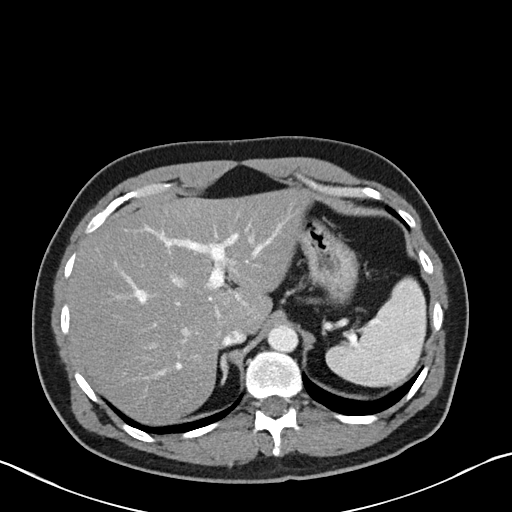
[im 88/93  soft-tissue]
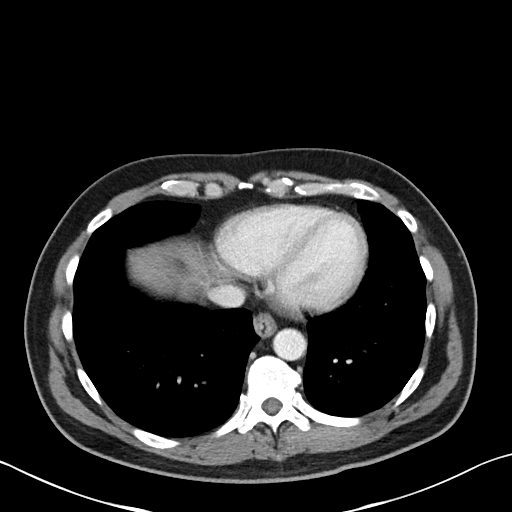

[Series 5: coronal st · coronal · 0.68mm/px · 3 of 81 slices shown]
[im 27/81  soft-tissue]
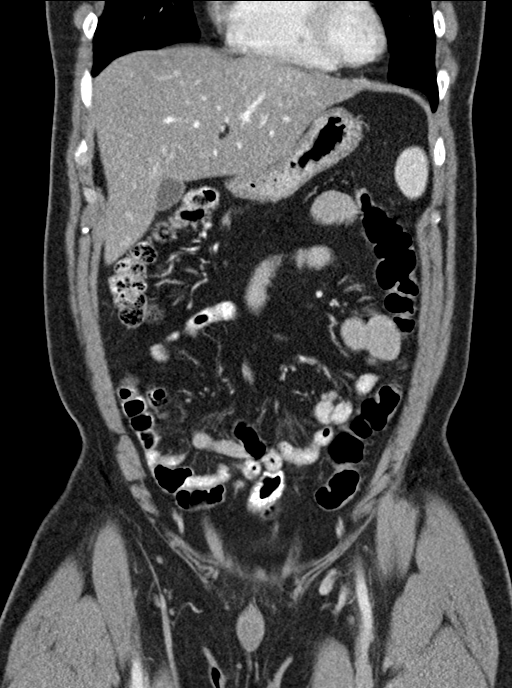
[im 36/81  soft-tissue]
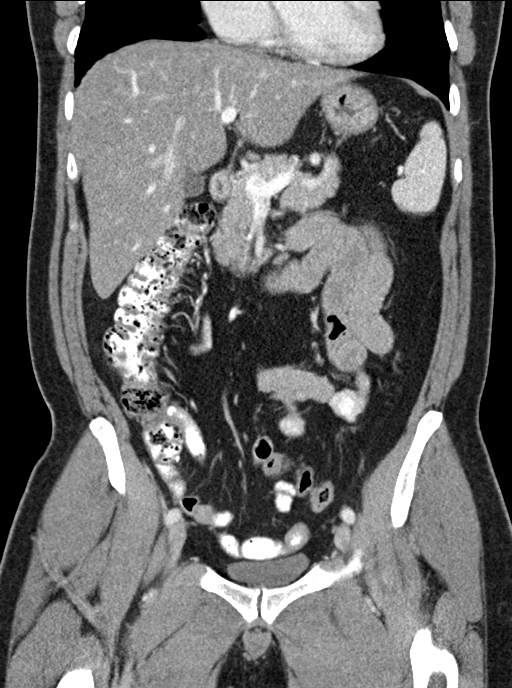
[im 45/81  soft-tissue]
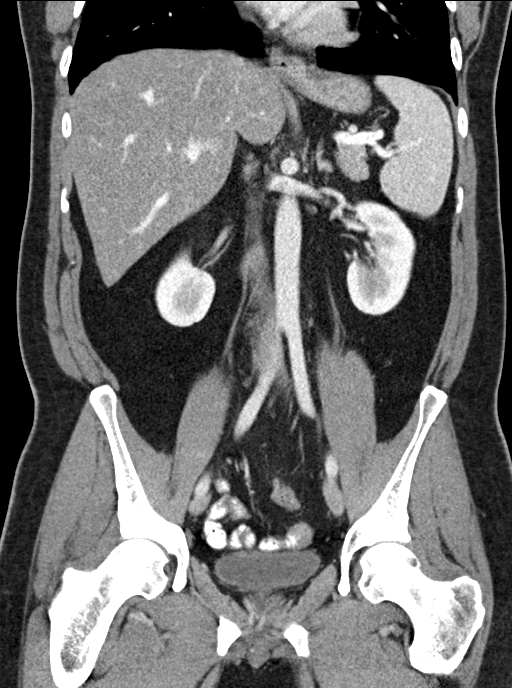

[16 of 46 positions shown; findings below may reference images not displayed]

RADIATION DOSE REDUCTION: This exam was performed according to the
departmental dose-optimization program which includes automated
exposure control, adjustment of the mA and/or kV according to
patient size and/or use of iterative reconstruction technique.

CONTRAST:  100mL OMNIPAQUE IOHEXOL 300 MG/ML  SOLN
FINDINGS: Lower Chest: No acute findings.

Hepatobiliary: No hepatic masses identified. Mild-to-moderate
diffuse hepatic steatosis is increased since previous study.
Gallbladder is unremarkable. No evidence of biliary ductal
dilatation.

Pancreas:  No mass or inflammatory changes.

Spleen: Within normal limits in size and appearance.

Adrenals/Urinary Tract: No masses identified. No evidence of
ureteral calculi or hydronephrosis.

Stomach/Bowel: No evidence of obstruction, inflammatory process or
abnormal fluid collections.

Vascular/Lymphatic: No pathologically enlarged lymph nodes. No acute
vascular findings.

Reproductive:  No mass or other significant abnormality.

Other: Small fat-containing left inguinal hernia versus spermatic
cord lipoma is new since prior exam.

Musculoskeletal:  No suspicious bone lesions identified.
IMPRESSION: No acute findings within the abdomen or pelvis.

Increased hepatic steatosis.

Small fat-containing left inguinal hernia versus spermatic cord
lipoma.
